# Patient Record
Sex: Male | Born: 1965 | Race: Black or African American | Hispanic: No | Marital: Married | Smoking: Never smoker
Health system: Southern US, Community
[De-identification: ages and names within clinical notes are randomized; demographics above are authoritative.]

## PROBLEM LIST (undated history)

## (undated) DIAGNOSIS — K219 Gastro-esophageal reflux disease without esophagitis: Secondary | ICD-10-CM

## (undated) DIAGNOSIS — I493 Ventricular premature depolarization: Secondary | ICD-10-CM

## (undated) DIAGNOSIS — K921 Melena: Secondary | ICD-10-CM

## (undated) HISTORY — DX: Melena: K92.1

## (undated) HISTORY — DX: Gastro-esophageal reflux disease without esophagitis: K21.9

## (undated) HISTORY — DX: Ventricular premature depolarization: I49.3

## (undated) HISTORY — PX: OTHER SURGICAL HISTORY: SHX169

---

## 1998-08-30 ENCOUNTER — Emergency Department (HOSPITAL_COMMUNITY): Admission: EM | Admit: 1998-08-30 | Discharge: 1998-08-30 | Payer: Self-pay | Admitting: Emergency Medicine

## 1998-08-30 ENCOUNTER — Encounter: Payer: Self-pay | Admitting: Internal Medicine

## 2000-06-25 ENCOUNTER — Encounter: Payer: Self-pay | Admitting: Emergency Medicine

## 2000-06-25 ENCOUNTER — Emergency Department (HOSPITAL_COMMUNITY): Admission: EM | Admit: 2000-06-25 | Discharge: 2000-06-25 | Payer: Self-pay | Admitting: Emergency Medicine

## 2002-03-10 ENCOUNTER — Emergency Department (HOSPITAL_COMMUNITY): Admission: EM | Admit: 2002-03-10 | Discharge: 2002-03-10 | Payer: Self-pay | Admitting: *Deleted

## 2002-03-10 ENCOUNTER — Encounter: Payer: Self-pay | Admitting: *Deleted

## 2003-06-17 ENCOUNTER — Emergency Department (HOSPITAL_COMMUNITY): Admission: EM | Admit: 2003-06-17 | Discharge: 2003-06-17 | Payer: Self-pay

## 2004-12-04 ENCOUNTER — Ambulatory Visit: Payer: Self-pay

## 2008-01-19 ENCOUNTER — Encounter: Payer: Self-pay | Admitting: Gastroenterology

## 2008-01-20 ENCOUNTER — Ambulatory Visit: Payer: Self-pay | Admitting: Cardiology

## 2008-01-30 ENCOUNTER — Encounter: Payer: Self-pay | Admitting: Gastroenterology

## 2008-01-31 ENCOUNTER — Ambulatory Visit: Payer: Self-pay

## 2008-01-31 ENCOUNTER — Encounter: Payer: Self-pay | Admitting: Cardiology

## 2008-02-16 ENCOUNTER — Ambulatory Visit: Payer: Self-pay | Admitting: Gastroenterology

## 2008-02-16 DIAGNOSIS — K219 Gastro-esophageal reflux disease without esophagitis: Secondary | ICD-10-CM | POA: Insufficient documentation

## 2008-02-16 DIAGNOSIS — R1012 Left upper quadrant pain: Secondary | ICD-10-CM | POA: Insufficient documentation

## 2008-02-16 DIAGNOSIS — R195 Other fecal abnormalities: Secondary | ICD-10-CM | POA: Insufficient documentation

## 2008-03-15 ENCOUNTER — Ambulatory Visit: Payer: Self-pay | Admitting: Gastroenterology

## 2008-04-13 ENCOUNTER — Ambulatory Visit: Payer: Self-pay | Admitting: Gastroenterology

## 2008-06-07 ENCOUNTER — Ambulatory Visit: Payer: Self-pay | Admitting: Gastroenterology

## 2008-07-04 ENCOUNTER — Encounter: Payer: Self-pay | Admitting: Gastroenterology

## 2009-02-25 ENCOUNTER — Emergency Department (HOSPITAL_COMMUNITY): Admission: EM | Admit: 2009-02-25 | Discharge: 2009-02-25 | Payer: Self-pay | Admitting: Emergency Medicine

## 2010-08-19 NOTE — Assessment & Plan Note (Signed)
[facility]                            CARDIOLOGY OFFICE NOTE   Juan Tanner, Juan Tanner                       MRN:          [id number]  DATE:01/20/2008                            DOB:          11/07/1965    REFERRING PHYSICIAN:  [facility]   REASON FOR CONSULTATION:  Evaluate patient with abnormal  electrocardiogram, chest pain and premature ventricular contractions.   HISTORY OF PRESENT ILLNESS:  The patient is a pleasant 45 year old  gentleman originally from [place].  He has had no prior cardiac history.  He has a history of some abdominal discomfort. He said he had this some  years ago, though I am not clear what the etiology was.  At this time,  he noticed some chest discomfort and palpitations while this was going  on.  He was treated apparently for peptic ulcer disease, though he did  not describe a workup to confirm this.  He now was presenting to Urgent  Care with somewhat similar complaints.  He is complaining of discomfort  in his left lower quadrant.  He says when this starts he also starts to  get some discomfort up into his chest.  He sometimes feels a little  short of breath with this.  He may have some occasional palpitations.  He may have some stabbing discomfort in his chest under his left breast.  This is fleeting though.  His predominant complaint has been this  abdominal discomfort, though he has not had any fevers, chills, nausea,  vomiting, diarrhea, constipation, hematemesis, hematochezia, or melena.  He did see urgent care yesterday and apparently drew blood.  I am not  sure of these results.  He has been able to work.  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  With  this, he denies any chest pressure, neck, or arm discomfort.  He has had  no palpitations with this activity.  He denies any presyncope or  syncope.   PAST MEDICAL HISTORY:  He has no history of hypertension,  diabetes, or  hyperlipidemia.   PAST SURGICAL HISTORY:  None.   ALLERGIES:  None.   MEDICATIONS:  None.   SOCIAL HISTORY:  He never smoked cigarettes.  He is not drink alcohol.  He does work.  He is married.  He has one brand new son.   FAMILY HISTORY:  Noncontributory for early coronary artery disease.   REVIEW OF SYSTEMS:  As stated in the HPI and negative for all other  systems.   PHYSICAL EXAMINATION:  GENERAL:  The patient is pleasant and in no  distress.  VITAL SIGNS:  Blood pressure 120/73, heart rate 61 and regular, weight  165 pounds, and body mass index 26.  HEENT:  Eyelids are unremarkable; pupils are equal, round, reactive to  light; fundi not visualized; fundi within normal limits; and oral mucosa  unremarkable.  NECK:  No jugular distention at 45 degrees; carotid upstroke brisk and  symmetric; no bruits; and no thyromegaly.  LYMPHATICS:  No cervical, axillary, or inguinal adenopathy.  LUNGS:  Clear to  auscultation bilaterally.  BACK:  No costovertebral tenderness.  CHEST:  Unremarkable.  HEART:  PMI not displaced or sustained; S1 and S2 within normal limits;  no S3, no S4, no clicks, no rubs, and no murmurs.  ABDOMEN:  Flat;  positive bowel sounds; normal in frequency and pitch; no bruits, no  rebound, no significant tenderness, though there is some slight  tenderness in the right lower quadrant with deep palpation.  SKIN:  No rashes, no nodules.  EXTREMITIES:  A 2+ pulse throughout; no edema, no cyanosis, and no  clubbing.  NEURO:  Oriented to person, place, and time; cranial nerves II through  XII are grossly intact; motor grossly intact.   EKG sinus rhythm, rate 61; axis within normal limits, intervals within  normal limits, premature ventricular contraction, voltage does not meet  criteria for LVH in this young gentleman.   ASSESSMENT AND PLAN:  1. Chest comfort and premature ventricular contractions.  The patient      has these findings.  Chest  discomfort is atypical.  At this point,      I plan an echocardiogram to make he has a structurally normal      heart.  We will check the labs and that were done at Urgent Care,      hoping to find a TSH, BMET, and magnesium.  If not these can be      drawn when he comes back for his echo.  If he has a structurally      normal heart and normal labs, since he is not particularly feeling      these palpitations and the pain is atypical, I would not suggest      further cardiovascular testing.  However, I will set him up to see      gastroenterologist as his predominant complaint is his recurrent      left lower quadrant discomfort.  He has not any acute distress.      This will be an elective evaluation.  2. Follow up will be based on results of the above.  Although, I have      instructed him carefully by coming back for any increase in his      symptoms.     Juan Rotunda, MD, [facility]  Electronically Signed    JH/MedQ  DD: 01/20/2008  DT: 01/21/2008  Job #: [id number]   cc:   [facility]

## 2011-05-09 ENCOUNTER — Ambulatory Visit (INDEPENDENT_AMBULATORY_CARE_PROVIDER_SITE_OTHER): Payer: BC Managed Care – PPO | Admitting: Family Medicine

## 2011-05-09 DIAGNOSIS — R300 Dysuria: Secondary | ICD-10-CM

## 2011-05-09 DIAGNOSIS — K2970 Gastritis, unspecified, without bleeding: Secondary | ICD-10-CM

## 2011-05-09 DIAGNOSIS — K2990 Gastroduodenitis, unspecified, without bleeding: Secondary | ICD-10-CM

## 2011-05-09 DIAGNOSIS — Z113 Encounter for screening for infections with a predominantly sexual mode of transmission: Secondary | ICD-10-CM

## 2011-05-09 DIAGNOSIS — R369 Urethral discharge, unspecified: Secondary | ICD-10-CM

## 2011-05-09 LAB — POCT UA - MICROSCOPIC ONLY
Bacteria, U Microscopic: NEGATIVE
Casts, Ur, LPF, POC: NEGATIVE
Crystals, Ur, HPF, POC: NEGATIVE
Mucus, UA: NEGATIVE
RBC, urine, microscopic: NEGATIVE
Yeast, UA: NEGATIVE

## 2011-05-09 LAB — POCT CBC
Granulocyte percent: 49.1 %G (ref 37–80)
HCT, POC: 43.6 % (ref 43.5–53.7)
Hemoglobin: 13.9 g/dL — AB (ref 14.1–18.1)
Lymph, poc: 3.7 — AB (ref 0.6–3.4)
MCH, POC: 27.3 pg (ref 27–31.2)
MCHC: 31.9 g/dL (ref 31.8–35.4)
MCV: 85.7 fL (ref 80–97)
MID (cbc): 0.6 (ref 0–0.9)
MPV: 12.3 fL (ref 0–99.8)
POC Granulocyte: 4.1 (ref 2–6.9)
POC LYMPH PERCENT: 44.1 %L (ref 10–50)
POC MID %: 6.8 % (ref 0–12)
Platelet Count, POC: 229 10*3/uL (ref 142–424)
RBC: 5.09 M/uL (ref 4.69–6.13)
RDW, POC: 14.1 %
WBC: 8.4 10*3/uL (ref 4.6–10.2)

## 2011-05-09 LAB — POCT URINALYSIS DIPSTICK
Bilirubin, UA: NEGATIVE
Blood, UA: NEGATIVE
Glucose, UA: NEGATIVE
Ketones, UA: NEGATIVE
Leukocytes, UA: NEGATIVE
Nitrite, UA: NEGATIVE
Protein, UA: NEGATIVE
Spec Grav, UA: 1.015
Urobilinogen, UA: 0.2
pH, UA: 6

## 2011-05-09 MED ORDER — SUCRALFATE 1 G PO TABS: 1.0000 g | ORAL_TABLET | Freq: Four times a day (QID) | ORAL | Status: DC

## 2011-05-10 LAB — HIV ANTIBODY (ROUTINE TESTING W REFLEX): HIV: NONREACTIVE

## 2011-05-10 NOTE — Progress Notes (Signed)
  Subjective:    Patient ID: Juan Tanner, male    DOB: 19-Nov-1965, 46 y.o.   MRN: [id number]  Abdominal Pain This is a chronic problem. The current episode started more than 1 year ago. The onset quality is undetermined. The problem occurs intermittently. The problem has been unchanged. The pain is located in the epigastric region. The pain is mild. The quality of the pain is aching, burning and cramping. The abdominal pain does not radiate. Associated symptoms include dysuria. Pertinent negatives include no constipation, diarrhea, fever, frequency, headaches, hematuria, myalgias, nausea or vomiting. The pain is aggravated by nothing. The pain is relieved by nothing. He has tried proton pump inhibitors for the symptoms.  Dysuria  This is a new problem. The current episode started in the past 7 days. The problem occurs intermittently. The quality of the pain is described as burning. The patient is experiencing no pain. There has been no fever. The fever has been present for 3 - 4 days. He is sexually active. There is no history of pyelonephritis. Associated symptoms include flank pain. Pertinent negatives include no chills, discharge, frequency, hematuria, hesitancy, nausea, sweats, urgency or vomiting. He has tried nothing for the symptoms. There is no history of kidney stones or recurrent UTIs.   Has a penile rash, ? discharge   Review of Systems  Constitutional: Negative for fever, chills, diaphoresis, activity change, appetite change, fatigue and unexpected weight change.  HENT: Negative for neck pain.   Respiratory: Negative for shortness of breath and wheezing.   Cardiovascular: Negative for chest pain and palpitations.  Gastrointestinal: Positive for abdominal pain. Negative for nausea, vomiting, diarrhea, constipation, blood in stool and abdominal distention.  Genitourinary: Positive for dysuria and flank pain. Negative for hesitancy, urgency, frequency, hematuria, discharge, difficulty  urinating and testicular pain.  Musculoskeletal: Negative for myalgias and joint swelling.  Neurological: Negative for headaches.  Hematological: Negative for adenopathy.       Objective:   Physical Exam  Constitutional: He is oriented to person, place, and time. He appears well-developed and well-nourished.  HENT:  Head: Normocephalic and atraumatic.  Eyes: EOM are normal.  Neck: Normal range of motion. Neck supple. No thyromegaly present.  Cardiovascular: Normal rate, regular rhythm and normal heart sounds.  Exam reveals no gallop and no friction rub.   No murmur heard. Pulmonary/Chest: Effort normal and breath sounds normal.  Abdominal: Soft. Bowel sounds are normal. He exhibits no distension and no mass. There is no tenderness. There is no rebound and no guarding.  Genitourinary: No penile tenderness.       Penile rash, appears pearly, waxt with umbilication, ? Molluscum contagiosum.   Musculoskeletal: Normal range of motion.  Neurological: He is alert and oriented to person, place, and time. He has normal reflexes.  Skin: Skin is warm and dry. No erythema.  Psychiatric: He has a normal mood and affect.          Assessment & Plan:  1. Mid epigastric pain most likely related to gastritis with  h/o ulcers due to H Pylori.- Rx Carafate and PPO for now. Check H/ Pylori. 2. Penile rash and dysuria-UA was negative. I will get STD test although pt denies any h/o of STD. 3. 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 STD Screening-Check HIV, G/C urine probe, RPR, HSV 1 & 2.   F/u in 2 weeks

## 2011-05-11 LAB — GC/CHLAMYDIA PROBE AMP, URINE
Chlamydia, Swab/Urine, PCR: NEGATIVE
GC Probe Amp, Urine: NEGATIVE

## 2011-05-11 LAB — RPR

## 2011-05-11 LAB — HSV(HERPES SIMPLEX VRS) I + II AB-IGG
HSV 1 Glycoprotein G Ab, IgG: 10.79 IV — ABNORMAL HIGH
HSV 2 Glycoprotein G Ab, IgG: 4.77 IV — ABNORMAL HIGH

## 2011-05-18 ENCOUNTER — Telehealth: Payer: Self-pay

## 2011-05-18 ENCOUNTER — Other Ambulatory Visit: Payer: Self-pay | Admitting: Family Medicine

## 2011-05-18 NOTE — Telephone Encounter (Signed)
Pt would like lab results.  

## 2011-05-18 NOTE — Telephone Encounter (Signed)
.  [facility]  PT HAD LABS DONE AND REALLY WOULD LIKE RESULTS PLEASE CALL [phone]

## 2011-05-19 NOTE — Telephone Encounter (Signed)
Spoke with Dr. Conley Rolls and she plans to discuss labs with patient in am.  Called patient and let him know this.

## 2011-05-20 ENCOUNTER — Telehealth: Payer: Self-pay | Admitting: Family Medicine

## 2011-05-20 DIAGNOSIS — K219 Gastro-esophageal reflux disease without esophagitis: Secondary | ICD-10-CM

## 2011-05-20 DIAGNOSIS — A6000 Herpesviral infection of urogenital system, unspecified: Secondary | ICD-10-CM

## 2011-05-20 MED ORDER — OMEPRAZOLE 20 MG PO CPDR: 20.0000 mg | DELAYED_RELEASE_CAPSULE | Freq: Two times a day (BID) | ORAL | Status: DC

## 2011-05-20 MED ORDER — VALACYCLOVIR HCL 1 G PO TABS: 1000.0000 mg | ORAL_TABLET | Freq: Two times a day (BID) | ORAL | Status: AC

## 2011-05-20 NOTE — Telephone Encounter (Signed)
Spoke with patient regarding labs. 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

## 2011-10-11 ENCOUNTER — Ambulatory Visit: Payer: BC Managed Care – PPO

## 2011-10-11 ENCOUNTER — Ambulatory Visit (INDEPENDENT_AMBULATORY_CARE_PROVIDER_SITE_OTHER): Payer: BC Managed Care – PPO | Admitting: Emergency Medicine

## 2011-10-11 VITALS — BP 102/62 | HR 54 | Temp 97.7°F | Resp 14 | Ht 67.0 in | Wt 158.4 lb

## 2011-10-11 DIAGNOSIS — K219 Gastro-esophageal reflux disease without esophagitis: Secondary | ICD-10-CM

## 2011-10-11 MED ORDER — LANSOPRAZOLE 30 MG PO CPDR: 30.0000 mg | DELAYED_RELEASE_CAPSULE | Freq: Every day | ORAL | Status: DC

## 2011-10-11 MED ORDER — SUCRALFATE 1 G PO TABS: 1.0000 g | ORAL_TABLET | Freq: Four times a day (QID) | ORAL | Status: DC

## 2011-10-11 NOTE — Progress Notes (Signed)
  Subjective:    Patient ID: Juan Tanner, male    DOB: 03/29/1966, 46 y.o.   MRN: [id number]  HPI    Review of Systems     Objective:   Physical Exam        Assessment & Plan:  [facility] reading (PRIMARY) by  Dr. Dareen Piano.  No acute disease.

## 2011-10-11 NOTE — Patient Instructions (Signed)
GERDGastroesophageal Reflux Disease, Adult Gastroesophageal reflux disease (GERD) happens when acid from your stomach flows up into the esophagus. When acid comes in contact with the esophagus, the acid causes soreness (inflammation) in the esophagus. Over time, GERD may create small holes (ulcers) in the lining of the esophagus. CAUSES   Increased body weight. This puts pressure on the stomach, making acid rise from the stomach into the esophagus.   Smoking. This increases acid production in the stomach.   Drinking alcohol. This causes decreased pressure in the lower esophageal sphincter (valve or ring of muscle between the esophagus and stomach), allowing acid from the stomach into the esophagus.   Late evening meals and a full stomach. This increases pressure and acid production in the stomach.   A malformed lower esophageal sphincter.  Sometimes, no cause is found. SYMPTOMS   Burning pain in the lower part of the mid-chest behind the breastbone and in the mid-stomach area. This may occur twice a week or more often.   Trouble swallowing.   Sore throat.   Dry cough.   Asthma-like symptoms including chest tightness, shortness of breath, or wheezing.  DIAGNOSIS  Your caregiver may be able to diagnose GERD based on your symptoms. In some cases, X-rays and other tests may be done to check for complications or to check the condition of your stomach and esophagus. TREATMENT  Your caregiver may recommend over-the-counter or prescription medicines to help decrease acid production. Ask your caregiver before starting or adding any new medicines.  HOME CARE INSTRUCTIONS   Change the factors that you can control. Ask your caregiver for guidance concerning weight loss, quitting smoking, and alcohol consumption.   Avoid foods and drinks that make your symptoms worse, such as:   Caffeine or alcoholic drinks.   Chocolate.   Peppermint or mint flavorings.   Garlic and onions.   Spicy  foods.   Citrus fruits, such as oranges, lemons, or limes.   Tomato-based foods such as sauce, chili, salsa, and pizza.   Fried and fatty foods.   Avoid lying down for the 3 hours prior to your bedtime or prior to taking a nap.   Eat small, frequent meals instead of large meals.   Wear loose-fitting clothing. Do not wear anything tight around your waist that causes pressure on your stomach.   Raise the head of your bed 6 to 8 inches with wood blocks to help you sleep. Extra pillows will not help.   Only take over-the-counter or prescription medicines for pain, discomfort, or fever as directed by your caregiver.   Do not take aspirin, ibuprofen, or other nonsteroidal anti-inflammatory drugs (NSAIDs).  SEEK IMMEDIATE MEDICAL CARE IF:   You have pain in your arms, neck, jaw, teeth, or back.   Your pain increases or changes in intensity or duration.   You develop nausea, vomiting, or sweating (diaphoresis).   You develop shortness of breath, or you faint.   Your vomit is green, yellow, black, or looks like coffee grounds or blood.   Your stool is red, bloody, or black.  These symptoms could be signs of other problems, such as heart disease, gastric bleeding, or esophageal bleeding. MAKE SURE YOU:   Understand these instructions.   Will watch your condition.   Will get help right away if you are not doing well or get worse.  Document Released: 12/31/2004 Document Revised: 03/12/2011 Document Reviewed: 10/10/2010 [facility] Patient Information 2012 [place], [place].

## 2011-10-11 NOTE — Progress Notes (Signed)
  Subjective:    Patient ID: Juan Tanner, male    DOB: 11-28-1965, 46 y.o.   MRN: [id number]  Shortness of Breath This is a recurrent problem. The current episode started 1 to 4 weeks ago. The problem occurs daily. The problem has been unchanged. Associated symptoms include abdominal pain. Pertinent negatives include no claudication, coryza, hemoptysis, leg swelling, orthopnea, PND, rash, sputum production, syncope or wheezing. The symptoms are aggravated by lying flat. He has tried nothing for the symptoms. There is no history of allergies, aspirin allergies, asthma, bronchiolitis, CAD, chronic lung disease, COPD, DVT, a heart failure, PE, pneumonia or a recent surgery.      Review of Systems  Constitutional: Positive for appetite change.  HENT: Negative.   Eyes: Negative.   Respiratory: Positive for shortness of breath. Negative for hemoptysis, sputum production and wheezing.   Cardiovascular: Negative.  Negative for orthopnea, claudication, leg swelling, syncope and PND.  Gastrointestinal: Positive for nausea and abdominal pain.  Genitourinary: Negative.   Musculoskeletal: Negative.   Skin: Negative for rash.  Neurological: Negative.        Objective:   Physical Exam  Constitutional: He is oriented to person, place, and time. He appears well-developed and well-nourished.  HENT:  Head: Normocephalic and atraumatic.  Eyes: Pupils are equal, round, and reactive to light. No scleral icterus.  Neck: Normal range of motion. Neck supple. No thyromegaly present.  Cardiovascular: Normal rate, regular rhythm and normal heart sounds.   Pulmonary/Chest: Effort normal and breath sounds normal.  Abdominal: Soft. There is tenderness in the epigastric area.  Musculoskeletal: Normal range of motion.  Neurological: He is alert and oriented to person, place, and time.  Skin: Skin is warm and dry.          Assessment & Plan:  History of GERD off medication prolonged period.  Describes  chest burning pain like suomething rising in chest.  Frequent indigestion.   No cough or objective shortness of breath  Will get CXR and EKG

## 2012-05-09 ENCOUNTER — Ambulatory Visit: Payer: BC Managed Care – PPO

## 2012-05-09 ENCOUNTER — Ambulatory Visit (INDEPENDENT_AMBULATORY_CARE_PROVIDER_SITE_OTHER): Payer: BC Managed Care – PPO | Admitting: Family Medicine

## 2012-05-09 VITALS — BP 120/70 | HR 72 | Temp 98.0°F | Resp 16 | Ht 66.5 in | Wt 161.0 lb

## 2012-05-09 DIAGNOSIS — A6000 Herpesviral infection of urogenital system, unspecified: Secondary | ICD-10-CM

## 2012-05-09 DIAGNOSIS — R109 Unspecified abdominal pain: Secondary | ICD-10-CM

## 2012-05-09 DIAGNOSIS — K219 Gastro-esophageal reflux disease without esophagitis: Secondary | ICD-10-CM

## 2012-05-09 DIAGNOSIS — I498 Other specified cardiac arrhythmias: Secondary | ICD-10-CM

## 2012-05-09 DIAGNOSIS — R001 Bradycardia, unspecified: Secondary | ICD-10-CM

## 2012-05-09 LAB — COMPREHENSIVE METABOLIC PANEL
ALT: 14 U/L (ref 0–53)
Albumin: 4.1 g/dL (ref 3.5–5.2)
BUN: 11 mg/dL (ref 6–23)
CO2: 27 mEq/L (ref 19–32)
Calcium: 9 mg/dL (ref 8.4–10.5)
Chloride: 107 mEq/L (ref 96–112)
Creat: 0.75 mg/dL (ref 0.50–1.35)
Potassium: 4 mEq/L (ref 3.5–5.3)

## 2012-05-09 LAB — POCT CBC
Granulocyte percent: 51.1 %G (ref 37–80)
Hemoglobin: 13.6 g/dL — AB (ref 14.1–18.1)
MCH, POC: 27 pg (ref 27–31.2)
MCV: 87.8 fL (ref 80–97)
RBC: 5.03 M/uL (ref 4.69–6.13)
WBC: 6 10*3/uL (ref 4.6–10.2)

## 2012-05-09 MED ORDER — GI COCKTAIL ~~LOC~~
30.0000 mL | Freq: Once | ORAL | Status: AC
  Administered 2012-05-09: 30 mL via ORAL

## 2012-05-09 MED ORDER — SUCRALFATE 1 G PO TABS: 1.0000 g | ORAL_TABLET | Freq: Four times a day (QID) | ORAL | Status: DC

## 2012-05-09 MED ORDER — OMEPRAZOLE 20 MG PO CPDR: 20.0000 mg | DELAYED_RELEASE_CAPSULE | Freq: Two times a day (BID) | ORAL | Status: DC

## 2012-05-09 NOTE — Patient Instructions (Addendum)
We are going to treat your for stomach pain and excess stomach acid.  However, as we discussed it is also possible that you have an infection in your colon.  If you are not better by tomorrow please come back in- in that case will will need to do the CT scan.  I am going to send you back to the heart doctor as well- we will be in touch with you regarding your appointment    If you get worse tonight (incrased pain, vomiting, or fever) go to the Emergency Room at the hospital

## 2012-05-09 NOTE — Progress Notes (Signed)
[facility] [address], [place] [address] [phone]  Date:  05/09/2012   Name:  Juan Tanner   DOB:  14-Dec-1965   MRN:  [id number]  PCP:  No primary provider on file.    Chief Complaint: Abdominal Pain   History of Present Illness:  Juan Tanner is a 47 y.o. very pleasant male patient who presents with the following:  He is here today with a recurrent stomach problem.  He notes pain in the epigastric area, bloating and gas.  He has noted frequent loose stools for the last couple of days.  He is burping a lot and notes burning in his chest.  No nausea or vomiting.   He had eaten rice and beans prior to start of illness- he does feel that "it's something I ate."  He is still eating, but smaller amounts than usual. 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  Review of his paper chart backs ups this history.   Cardiac history: he denies any personal or family history of CAD.  He does not smoke. He has never noted CP with exercise.  He does not note any particular heart palpitations.  However, when his stomach acts up he does tend to notice a burning feeling into his chest- he associates this with GERD and not with his heart.  He has noted discomfort (like a burning) in his chest when his GERD has been symptomatic in the past as well  Patient Active Problem List  Diagnosis  . GERD  . ABDOMINAL PAIN, LEFT UPPER QUADRANT  . BLOOD IN STOOL, OCCULT    History reviewed. No pertinent past medical history.  History reviewed. No pertinent past surgical history.  History  Substance Use Topics  . Smoking status: Never Smoker   . Smokeless tobacco: Not on file  . Alcohol Use: No    No family history on file.  No Known Allergies  Medication list has been reviewed and updated.  Current Outpatient Prescriptions on File Prior to Visit   Medication Sig Dispense Refill  . lansoprazole (PREVACID) 30 MG capsule Take 1 capsule (30 mg total) by mouth daily.  30 capsule  5  . omeprazole (PRILOSEC) 20 MG capsule Take 1 capsule (20 mg total) by mouth 2 (two) times daily.  60 capsule  3  . sucralfate (CARAFATE) 1 G tablet Take 1 tablet (1 g total) by mouth 4 (four) times daily.  120 tablet  0  . sucralfate (CARAFATE) 1 G tablet Take 1 tablet (1 g total) by mouth 4 (four) times daily. 1 HOUR AC AND HS  120 tablet  0    Review of Systems:  As per HPI- otherwise negative.   Physical Examination: Filed Vitals:   05/09/12 0818  BP: 120/70  Pulse: 72  Temp: 98 F (36.7 C)  Resp: 16   Filed Vitals:   05/09/12 0818  Height: 5' 6.5" (1.689 m)  Weight: 161 lb (73.029 kg)   Body mass index is 25.60 kg/(m^2). Ideal Body Weight: Weight in (lb) to have BMI = 25: 156.9   GEN: WDWN, NAD, Non-toxic, A & O x 3, well appearing African male HEENT: Atraumatic, Normocephalic. Neck supple. No masses, No LAD. Bilateral TM wnl, oropharynx normal.  PEERL,EOMI.   Ears and Nose: No external deformity. CV: bradycardic and irregular, No M/G/R.  No JVD. No thrill. No extra heart sounds. PULM: CTA B, no wheezes, crackles, rhonchi. No retractions. No resp. distress. No accessory muscle use. ABD: S, ND, +BS. No rebound. No HSM.  He has minimal LLQ discomfort.  On re-exam this discomfort is in the epigastric and LLQ areas.  No RLQ tenderness  EXTR: No c/c/e NEURO Normal gait.  PSYCH: Normally interactive. Conversant. Not depressed or anxious appearing.  Calm demeanor.   EKG: ventricular bigeminy.    [facility] reading (PRIMARY) by  Dr. Patsy Lager. Abdominal series: no free air or obstruction apparent.  CXR: stable from 7/13, no active disease  CHEST - 2 VIEW  Comparison: 10/11/2011  Findings: Grossly unchanged cardiac silhouette and mediastinal contours. No focal airspace opacity. No pleural effusion or pneumothorax. Unchanged  bones.  IMPRESSION: No acute cardiopulmonary disease.  Clinical Data: Left lower quadrant pain  ABDOMEN - 2 VIEW  Comparison: None.  Findings: Normal bowel gas pattern. No obstruction or free air. The soft tissues and bony structures are unremarkable.  IMPRESSION: Normal exam  GI cocktail: did seem to help with his symptoms  Results for orders placed in visit on 05/09/12  POCT CBC      Component Value Range   WBC 6.0  4.6 - 10.2 K/uL   Lymph, poc 2.3  0.6 - 3.4   POC LYMPH PERCENT 39.1  10 - 50 %L   MID (cbc) 0.6  0 - 0.9   POC MID % 9.8  0 - 12 %M   POC Granulocyte 3.1  2 - 6.9   Granulocyte percent 51.1  37 - 80 %G   RBC 5.03  4.69 - 6.13 M/uL   Hemoglobin 13.6 (*) 14.1 - 18.1 g/dL   HCT, POC 52.8  41.3 - 53.7 %   MCV 87.8  80 - 97 fL   MCH, POC 27.0  27 - 31.2 pg   MCHC 30.8 (*) 31.8 - 35.4 g/dL   RDW, POC 24.4     Platelet Count, POC 218  142 - 424 K/uL   MPV 12.1  0 - 99.8 fL   Discussed his case with Dr. Johney Frame at [facility] cardiology who kindly reviewed his EKG.  He was seen by their office in 2009 and had an echo.  He has been noted to have frequent PVCs in the past but I do not see any history of bigeminy. No history of stress testing.  Dr. Johney Frame did recommend that we have Sophie follow-up at their office soon, but no acute or dangerous EKG changes noted at this time. Anuj is not having any CP suggestive of cardiac ischemia  Assessment and Plan: 1. Abdominal  pain, other specified site  POCT CBC, Comprehensive metabolic panel, DG Abd 2 Views  2. Bradycardia  EKG 12-Lead, Ambulatory referral to Cardiology  3. GERD (gastroesophageal reflux disease)  gi cocktail (Maalox,Lidocaine,Donnatal), DG Chest 2 View, omeprazole (PRILOSEC) 20 MG capsule, sucralfate (CARAFATE) 1 G tablet  4. Herpes genitalia  omeprazole (PRILOSEC) 20 MG capsule  5. Bigeminy  Ambulatory referral to Cardiology   [name] is here today with what he describes as his typical GERD symptoms.  I  discussed with him his EKG, and he received a call from [place] while still in the office asking him to call back to schedule an appt. He plans to follow- up with their office.    Long discussion regarding his LLQ pain- there is some language barrier but I was very careful to be sure Leonidus understood our discussion  and his options.  His CBC is reassuring, as is his history of having this pain in the past with GERD. However, it is also possible that he is having diverticulitis.  Offered to perform a CT today, and discussed the risk of unnecessary radiation if negative vs the benefit of early detection and avoidance of complications if positive.  For the time being [facility] declined a scan- we will treat him with carafate and prilosec as he has done in the past.  Await his CMP and will follow- up with him.  He knows to come back tomorrow if not better- to ED if worse in the meantime.    Abbe Amsterdam, MD

## 2012-05-10 ENCOUNTER — Ambulatory Visit (HOSPITAL_COMMUNITY): Payer: BC Managed Care – PPO | Attending: Cardiology | Admitting: Radiology

## 2012-05-10 ENCOUNTER — Other Ambulatory Visit (HOSPITAL_COMMUNITY): Payer: Self-pay

## 2012-05-10 ENCOUNTER — Other Ambulatory Visit (HOSPITAL_COMMUNITY): Payer: Self-pay | Admitting: Family Medicine

## 2012-05-10 DIAGNOSIS — R001 Bradycardia, unspecified: Secondary | ICD-10-CM

## 2012-05-10 DIAGNOSIS — I079 Rheumatic tricuspid valve disease, unspecified: Secondary | ICD-10-CM | POA: Insufficient documentation

## 2012-05-10 DIAGNOSIS — I495 Sick sinus syndrome: Secondary | ICD-10-CM | POA: Insufficient documentation

## 2012-05-10 DIAGNOSIS — I379 Nonrheumatic pulmonary valve disorder, unspecified: Secondary | ICD-10-CM | POA: Insufficient documentation

## 2012-05-10 NOTE — Progress Notes (Signed)
Called today and LMOM- his CMP looks ok.  Reminded him that if he still has pain we need to re- evaluate/ do a CT scan so please come to clinic in that case

## 2012-05-10 NOTE — Progress Notes (Signed)
Echocardiogram performed.  

## 2012-06-01 ENCOUNTER — Ambulatory Visit (INDEPENDENT_AMBULATORY_CARE_PROVIDER_SITE_OTHER): Payer: BC Managed Care – PPO | Admitting: Internal Medicine

## 2012-06-01 ENCOUNTER — Encounter: Payer: Self-pay | Admitting: Internal Medicine

## 2012-06-01 VITALS — BP 100/72 | HR 76 | Ht 65.0 in | Wt 164.1 lb

## 2012-06-01 DIAGNOSIS — I493 Ventricular premature depolarization: Secondary | ICD-10-CM

## 2012-06-01 DIAGNOSIS — I4949 Other premature depolarization: Secondary | ICD-10-CM

## 2012-06-01 NOTE — Patient Instructions (Addendum)
  Your physician has requested that you have an exercise tolerance test. For further information please visit [email or web address] Please also follow instruction sheet, as given.---Dr Allred  Your physician recommends that you schedule a follow-up appointment in: 4 weeks with Dr Johney Frame   Your physician has recommended that you wear a holter monitor. Holter monitors are medical devices that record the heart's electrical activity. Doctors most often use these monitors to diagnose arrhythmias. Arrhythmias are problems with the speed or rhythm of the heartbeat. The monitor is a small, portable device. You can wear one while you do your normal daily activities. This is usually used to diagnose what is causing palpitations/syncope (passing out).

## 2012-06-01 NOTE — Progress Notes (Signed)
   Primary Care Physician: Dr Juan Tanner Juan Tanner is a 48 y.o. male with a h/o recently discovered PVCs who presents today for EP consultation.  He recently presented to Dr Jenness Corner office with complaints of epigastric bloating/ gas.  He was observed to have an irregular pulse and an ekg was obtained.  This documented sinus rhythm with PVCs in bigeminy.  He denies palpitations.  He reports that his energy/ exercise tolerance is preserved.  He has occasional "indigestion" but denies exertional chest pain.  Today, he denies symptoms of shortness of breath, orthopnea, PND, lower extremity edema, dizziness, presyncope, syncope, or neurologic sequela. The patient is tolerating medications without difficulties and is otherwise without complaint today.   Past Medical History  Diagnosis Date  . Premature ventricular contraction   . GERD (gastroesophageal reflux disease)    Past Surgical History  Procedure Laterality Date  . None      Current Outpatient Prescriptions  Medication Sig Dispense Refill  . lansoprazole (PREVACID) 30 MG capsule Take 1 capsule (30 mg total) by mouth daily.  30 capsule  5  . omeprazole (PRILOSEC) 20 MG capsule Take 1 capsule (20 mg total) by mouth 2 (two) times daily. After one month change to once a day  60 capsule  3  . sucralfate (CARAFATE) 1 G tablet Take 1 tablet (1 g total) by mouth 4 (four) times daily.  120 tablet  0  . sucralfate (CARAFATE) 1 G tablet Take 1 tablet (1 g total) by mouth 4 (four) times daily. Take before meals and before going to bed  40 tablet  1   No current facility-administered medications for this visit.    No Known Allergies  History   Social History  . Marital Status: Married    Spouse Name: N/A    Number of Children: N/A  . Years of Education: N/A   Occupational History  . Not on file.   Social History Main Topics  . Smoking status: Never Smoker   . Smokeless tobacco: Not on file  . Alcohol Use: No  . Drug Use: No    . Sexually Active: Not on file   Other Topics Concern  . Not on file   Social History Narrative   Lives in [place] with spouse and children.   Works in [employer]    FH- denies FH of sudden death  ROS- All systems are reviewed and negative except as per the HPI above  Physical Exam: Filed Vitals:   06/01/12 1600  BP: 100/72  Pulse: 76  Height: 5\' 5"  (1.651 m)  Weight: 164 lb 1.9 oz (74.444 kg)    GEN- The patient is well appearing, alert and oriented x 3 today.   Head- normocephalic, atraumatic Eyes-  Sclera clear, conjunctiva pink Ears- hearing intact Oropharynx- clear Neck- supple, no JVP Lymph- no cervical lymphadenopathy Lungs- Clear to ausculation bilaterally, normal work of breathing Heart- Regular rate and rhythm with frequent ectopy, no murmurs, rubs or gallops, PMI not laterally displaced GI- soft, NT, ND, + BS Extremities- no clubbing, cyanosis, or edema MS- no significant deformity or atrophy Skin- no rash or lesion Psych- euthymic mood, full affect Neuro- strength and sensation are intact  EKG today reveals sinus rhythm with PVCs in trigeminy. PVCs are not closely coupled to the preceeding t wave.  PVC morphology is LBB inferior axis with abrupt transition at V3. Echo 05/10/12 is reviewed and reveals no significant structural heart disease  Assessment and Plan:

## 2012-06-06 DIAGNOSIS — I493 Ventricular premature depolarization: Secondary | ICD-10-CM | POA: Insufficient documentation

## 2012-06-06 NOTE — Assessment & Plan Note (Signed)
The patient has frequent premature ventricular contractions.  He has no evidence of structural heart disease.  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  At this time, I will place a 24 hour holter to better characterize the frequency of the PVCs.  In addition, I will order a GXT to evaluate his exercise tolerance.  Return in 6 weeks for further assessment.

## 2012-06-13 ENCOUNTER — Encounter: Payer: BC Managed Care – PPO | Admitting: Physician Assistant

## 2012-06-23 ENCOUNTER — Encounter: Payer: BC Managed Care – PPO | Admitting: Physician Assistant

## 2012-06-24 ENCOUNTER — Ambulatory Visit (INDEPENDENT_AMBULATORY_CARE_PROVIDER_SITE_OTHER): Payer: BC Managed Care – PPO | Admitting: Physician Assistant

## 2012-06-24 ENCOUNTER — Encounter: Payer: Self-pay | Admitting: Internal Medicine

## 2012-06-24 ENCOUNTER — Ambulatory Visit (INDEPENDENT_AMBULATORY_CARE_PROVIDER_SITE_OTHER): Payer: BC Managed Care – PPO

## 2012-06-24 ENCOUNTER — Encounter: Payer: Self-pay | Admitting: Physician Assistant

## 2012-06-24 ENCOUNTER — Other Ambulatory Visit: Payer: Self-pay | Admitting: *Deleted

## 2012-06-24 VITALS — BP 126/62 | HR 60 | Wt 163.0 lb

## 2012-06-24 DIAGNOSIS — R079 Chest pain, unspecified: Secondary | ICD-10-CM

## 2012-06-24 DIAGNOSIS — I4949 Other premature depolarization: Secondary | ICD-10-CM

## 2012-06-24 DIAGNOSIS — R002 Palpitations: Secondary | ICD-10-CM

## 2012-06-24 DIAGNOSIS — I493 Ventricular premature depolarization: Secondary | ICD-10-CM

## 2012-06-24 DIAGNOSIS — K219 Gastro-esophageal reflux disease without esophagitis: Secondary | ICD-10-CM

## 2012-06-24 NOTE — Progress Notes (Signed)
[address], [address] [place], [place]  [address] Phone: [phone] Fax:  [phone]  Date:  06/24/2012   ID:  Juan Tanner, DOB 1965-12-14, MRN [id number]  PCP:  Janace Hoard, MD  Primary Cardiologist:  Dr. Hillis Range     History of Present Illness: Juan Tanner is a 47 y.o. male who is added on to my schedule for evaluation of chest pain.  He was to have an ETT yesterday but did not show.  He thought it was today and noted CP when he checked in, so the triage RN brought him back.  He was seen recently by Dr. Hillis Range for PVCs.  Echo 05/10/12: mild focal basal septal hypertrophy, EF 50-55%, normal wall motion, mild RVE.  He has had significant problems with abdominal discomfort, bloating and indigestion. He saw Dr. Russella Dar in 2010 with a normal EGD. He was placed on PPI for GERD at that time. He was seen by his PCP recently and placed on PPI plus Carafate. He notes that his abdominal pain and bloating improved with Carafate but worsened when this regimen was completed. He notes significant problems with certain meals, especially greasy foods. He feels somewhat short of breath. He feels discomfort radiating up into his chest. He also notes some throat discomfort as well as some radiation to his back into his arms. The pain comes and goes. It is not necessarily exertional. He does feel nauseated. He denies syncope, orthopnea, PND.  After significant questioning, it appears that all of his symptoms are related to meals.  Labs (2/14):  K 4, creatinine 0.75, ALT 14, Hgb 13.6  Wt Readings from Last 3 Encounters:  06/24/12 163 lb (73.936 kg)  06/01/12 164 lb 1.9 oz (74.444 kg)  05/09/12 161 lb (73.029 kg)     Past Medical History  Diagnosis Date  . Premature ventricular contraction   . GERD (gastroesophageal reflux disease)     Current Outpatient Prescriptions  Medication Sig Dispense Refill  . lansoprazole (PREVACID) 30 MG capsule Take 1 capsule (30 mg total) by mouth  daily.  30 capsule  5  . omeprazole (PRILOSEC) 20 MG capsule Take 1 capsule (20 mg total) by mouth 2 (two) times daily. After one month change to once a day  60 capsule  3  . sucralfate (CARAFATE) 1 G tablet Take 1 tablet (1 g total) by mouth 4 (four) times daily. Take before meals and before going to bed  40 tablet  1  . sucralfate (CARAFATE) 1 G tablet Take 1 tablet (1 g total) by mouth 4 (four) times daily.  120 tablet  0   No current facility-administered medications for this visit.    Allergies:   No Known Allergies  Social History:  The patient  reports that he has never smoked. He does not have any smokeless tobacco history on file. He reports that he does not drink alcohol or use illicit drugs.   Family History:  The patient's family history is negative for Heart attack.   ROS:  Please see the history of present illness.   He feels as though he may be developing a fever. He denies cough, vomiting, diarrhea, melena, hematochezia, hematemesis.   All other systems reviewed and negative.   PHYSICAL EXAM: VS:  BP 126/62  Pulse 60  Wt 163 lb (73.936 kg)  BMI 27.12 kg/m2 Well nourished, well developed, in no acute distress HEENT: normal Neck: no JVD Vascular: No carotid bruits Cardiac:  normal S1, S2; RRR; no  murmur Lungs:  clear to auscultation bilaterally, no wheezing, rhonchi or rales Abd: soft, nontender, no hepatomegaly Ext: no edema Skin: warm and dry Neuro:  CNs 2-12 intact, no focal abnormalities noted  EKG:  NSR, HR 60, frequent PVCs, no ischemic changes     ASSESSMENT AND PLAN:  1. Chest Pain:  Symptoms are quite atypical. They are most certainly related to GERD and his other gastrointestinal issues. His symptoms are all brought on by eating certain types of foods. His symptoms have improved in the past with taking PPIs and Carafate. I reviewed his case today with Dr. Antoine Poche. We have decided to proceed with his exercise treadmill test today. If this is unremarkable,  he should follow up with his PCP for further evaluation. He is followed at urgent care at [facility]. I have advised him to go there today for follow up. 2. GERD:  Continue PPI and follow up with PCP. 3. PVC's:  ETT today and follow up with Dr. Hillis Range 4. Disposition:  Follow up with Dr. Hillis Range as planned.  Signed, Tereso Newcomer, PA-C  1:37 PM 06/24/2012

## 2012-06-24 NOTE — Progress Notes (Signed)
Exercise Treadmill Test  Pre-Exercise Testing Evaluation Rhythm: normal sinus  Rate: 65     Test  Exercise Tolerance Test Ordering MD: Hillis Range, MD  Interpreting MD: Tereso Newcomer PA-C  Unique Test No: 1  Treadmill:  1  Indication for ETT: chest pain - rule out ischemia  Contraindication to ETT: No   Stress Modality: exercise - treadmill  Cardiac Imaging Performed: non   Protocol: standard Bruce - maximal  Max BP:  195/95  Max MPHR (bpm):  174 85% MPR (bpm):  148  MPHR obtained (bpm):  179 % MPHR obtained:  102%  Reached 85% MPHR (min:sec):  6:10 Total Exercise Time (min-sec):  9:59  Workload in METS:  11.4 Borg Scale: 17  Reason ETT Terminated:  desired heart rate attained    ST Segment Analysis At Rest: non-specific ST segment slurring With Exercise: non-specific ST changes  Other Information Arrhythmia:  frequent PVCs, Bigeminy during exercise that resolved after HR > 150 Angina during ETT:  absent (0) Quality of ETT:  diagnostic  ETT Interpretation:  normal - no evidence of ischemia by ST analysis  Comments: Good exercise tolerance. No chest pain. Normal BP response to exercise. No significant ST-T changes to suggest ischemia.  Frequent PVCs.  There was an increase in PVCs during early exercise with bigeminy at times. PVCs much less frequent (rare) after HR > 150 bpm.  Recommendations: Follow up with Dr. Hillis Range as planned. Signed, Tereso Newcomer, PA-C  12:19 PM 06/24/2012

## 2012-06-24 NOTE — Progress Notes (Signed)
Placed a 24 hr holter monitor on patient and went over instructions on how to use it and when to return it 

## 2012-06-25 ENCOUNTER — Ambulatory Visit (INDEPENDENT_AMBULATORY_CARE_PROVIDER_SITE_OTHER): Payer: BC Managed Care – PPO | Admitting: Family Medicine

## 2012-06-25 VITALS — BP 132/88 | HR 57 | Temp 97.7°F | Resp 16 | Ht 66.0 in | Wt 163.0 lb

## 2012-06-25 DIAGNOSIS — R1032 Left lower quadrant pain: Secondary | ICD-10-CM

## 2012-06-25 DIAGNOSIS — D649 Anemia, unspecified: Secondary | ICD-10-CM

## 2012-06-25 DIAGNOSIS — K219 Gastro-esophageal reflux disease without esophagitis: Secondary | ICD-10-CM

## 2012-06-25 DIAGNOSIS — M546 Pain in thoracic spine: Secondary | ICD-10-CM

## 2012-06-25 LAB — POCT CBC
HCT, POC: 41.4 % — AB (ref 43.5–53.7)
Lymph, poc: 3.2 (ref 0.6–3.4)
MCHC: 31.4 g/dL — AB (ref 31.8–35.4)
MCV: 86.3 fL (ref 80–97)
POC LYMPH PERCENT: 50 %L (ref 10–50)
RDW, POC: 13.6 %

## 2012-06-25 MED ORDER — SUCRALFATE 1 G PO TABS: 1.0000 g | ORAL_TABLET | Freq: Four times a day (QID) | ORAL | Status: DC

## 2012-06-25 NOTE — Progress Notes (Signed)
Subjective:    Patient ID: Juan Tanner, male    DOB: 04-18-1965, 47 y.o.   MRN: [id number]  HPI Juan Tanner is a 47 y.o. male  Hx of GERD and LLQ abdominal pain - reviewed 05/09/12 office visit with Dr. Patsy Lager.   Today states still having abdominal pain - similar to when seen by Dr. Patsy Lager. Had improved with Carafate - but pain returned off carafate - lower abdomen and heartburn sx's past week.  No blood in stool. Feels like food particles are still in feces. Last GI eval "long time ago". Apparently had normal EGD in 2010 - GI- Dr. Russella Dar.   Placed on PPi at that time. No measured fever, but feels like "in body". No nausea/vomiting.  Eating and drinking ok. Last BM yesterday - normal except particles of food.  Taking prevacid qd. Worse after eating fried chicken.   Now pain radiates into back - by the R shoulder blade. Past 2 weeks. Drives forklift, some cleaning at times. Denies recent injury in back.    S/p eval by cardiology yesterday, and thought that chest symptoms due to GERD.  Also had stress testing yesterday.   Results for orders placed in visit on 05/09/12  COMPREHENSIVE METABOLIC PANEL      Result Value Range   Sodium 140  135 - 145 mEq/L   Potassium 4.0  3.5 - 5.3 mEq/L   Chloride 107  96 - 112 mEq/L   CO2 27  19 - 32 mEq/L   Glucose, Bld 64 (*) 70 - 99 mg/dL   BUN 11  6 - 23 mg/dL   Creat 4.09  8.11 - 9.14 mg/dL   Total Bilirubin 0.7  0.3 - 1.2 mg/dL   Alkaline Phosphatase 100  39 - 117 U/L   AST 18  0 - 37 U/L   ALT 14  0 - 53 U/L   Total Protein 7.3  6.0 - 8.3 g/dL   Albumin 4.1  3.5 - 5.2 g/dL   Calcium 9.0  8.4 - 78.2 mg/dL  POCT CBC      Result Value Range   WBC 6.0  4.6 - 10.2 K/uL   Lymph, poc 2.3  0.6 - 3.4   POC LYMPH PERCENT 39.1  10 - 50 %L   MID (cbc) 0.6  0 - 0.9   POC MID % 9.8  0 - 12 %M   POC Granulocyte 3.1  2 - 6.9   Granulocyte percent 51.1  37 - 80 %G   RBC 5.03  4.69 - 6.13 M/uL   Hemoglobin 13.6 (*) 14.1 - 18.1 g/dL   HCT, POC 95.6   21.3 - 53.7 %   MCV 87.8  80 - 97 fL   MCH, POC 27.0  27 - 31.2 pg   MCHC 30.8 (*) 31.8 - 35.4 g/dL   RDW, POC 08.6     Platelet Count, POC 218  142 - 424 K/uL   MPV 12.1  0 - 99.8 fL     Review of Systems  Constitutional: Positive for fever (subjective only as above. ).  Cardiovascular: Negative for chest pain.       Heartburn only.   Gastrointestinal: Positive for abdominal pain. Negative for nausea, vomiting, diarrhea, constipation and blood in stool.  Musculoskeletal: Positive for myalgias (upper R back as above. ).  Skin: Negative for rash.       Objective:   Physical Exam  Vitals reviewed. Constitutional: He is oriented to person, place, and time.  He appears well-developed and well-nourished. No distress.  Cardiovascular: Normal rate, normal heart sounds and normal pulses.  An irregular rhythm present.  Occasional extrasystoles are present.  ectopic beats at times, but rate overall normal.   Pulmonary/Chest: Effort normal and breath sounds normal. No respiratory distress.  Abdominal: Soft. Bowel sounds are normal. He exhibits no distension. There is tenderness in the left lower quadrant. There is no rigidity, no guarding, no CVA tenderness, no tenderness at McBurney's point and negative Murphy's sign.  Musculoskeletal:       Back:  Neurological: He is alert and oriented to person, place, and time.  Skin: Skin is warm and dry. No rash noted.  Psychiatric: He has a normal mood and affect. His behavior is normal.     Results for orders placed in visit on 06/25/12  POCT CBC      Result Value Range   WBC 6.5  4.6 - 10.2 K/uL   Lymph, poc 3.2  0.6 - 3.4   POC LYMPH PERCENT 50.0  10 - 50 %L   MID (cbc) 0.5  0 - 0.9   POC MID % 8.3  0 - 12 %M   POC Granulocyte 2.7  2 - 6.9   Granulocyte percent 41.7  37 - 80 %G   RBC 4.80  4.69 - 6.13 M/uL   Hemoglobin 13.0 (*) 14.1 - 18.1 g/dL   HCT, POC 95.6 (*) 21.3 - 53.7 %   MCV 86.3  80 - 97 fL   MCH, POC 27.1  27 - 31.2 pg    MCHC 31.4 (*) 31.8 - 35.4 g/dL   RDW, POC 08.6     Platelet Count, POC 254  142 - 424 K/uL   MPV 10.3  0 - 99.8 fL       Assessment & Plan:  Juan Tanner is a 48 y.o. male LLQ abdominal pain - Plan: POCT CBC, Lipase, CT Abdomen Pelvis W Contrast, Ambulatory referral to Gastroenterology  GERD (gastroesophageal reflux disease) - Plan: sucralfate (CARAFATE) 1 G tablet, Ambulatory referral to Gastroenterology  Thoracic back pain  Anemia - Plan: Ambulatory referral to Gastroenterology   Upper R back pain - unlikely related to GI complaints.  Suspect rhomboid strain or spasm, but ddx of cervical source.  Trial of heat, stretches and rom of upper back and neck, and tylenol if needed.  Recheck next 2 weeks if not improving - sooner if worse.   GERD - recurrent, with persistent/recurrent abdominal pain, prior improvement on Carafate. Anemia - HGb 13.9 approx 1 year ago, 13.6 last ov, 13.0 today.  Will refer to gastroenterology - Dr. Russella Dar.  Check Lipase. Restart Carafate, and diet to avoid triggers for GERD.  With recurrent LLQ pain and anemia - will check CT abd/pelvis - r/o diverticulosis/litis, but may also need colonoscopy - refer to GI as above. Discussed plan, all questions answered and understanding of plan expressed.   At end of visit - states occasional soreness in R hand.  Plans on following up to talk about this in more detail, and to allow time for imaging inf needed.  Understanding expressed.    Patient Instructions  For your back - tylenol, stretches of back and neck, and recheck next 2 weeks if not improving - sooner if worse.  Can also recheck to discuss your hand symptoms then - or sooner if worse.   For your abdominal pain and heartburn - avoid the foods as discussed below that can make heartburn worse.  Continue  prevacid, and restart carafate.  We will refer ou back to Dr. Russella Dar, and schedule a cat scan of the abdomen in next few days. Return to the clinic or go to the nearest  emergency room if any of your symptoms worsen or new symptoms occur.    Meds ordered this encounter  Medications  . sucralfate (CARAFATE) 1 G tablet    Sig: Take 1 tablet (1 g total) by mouth 4 (four) times daily. Take before meals and before going to bed    Dispense:  40 tablet    Refill:  1

## 2012-06-25 NOTE — Patient Instructions (Addendum)
For your back - tylenol, stretches of back and neck, and recheck next 2 weeks if not improving - sooner if worse.  Can also recheck to discuss your hand symptoms then - or sooner if worse.   For your abdominal pain and heartburn - avoid the foods as discussed below that can make heartburn worse.  Continue prevacid, and restart carafate.  We will refer ou back to Dr. Russella Dar, and schedule a cat scan of the abdomen in next few days. Return to the clinic or go to the nearest emergency room if any of your symptoms worsen or new symptoms occur.

## 2012-06-27 ENCOUNTER — Encounter: Payer: Self-pay | Admitting: Gastroenterology

## 2012-07-06 ENCOUNTER — Ambulatory Visit: Payer: BC Managed Care – PPO | Admitting: Internal Medicine

## 2012-07-07 ENCOUNTER — Ambulatory Visit
Admission: RE | Admit: 2012-07-07 | Discharge: 2012-07-07 | Disposition: A | Discharge status: Home / Self Care | Payer: BC Managed Care – PPO | Source: Ambulatory Visit | Attending: Family Medicine | Admitting: Family Medicine

## 2012-07-07 DIAGNOSIS — R1032 Left lower quadrant pain: Secondary | ICD-10-CM

## 2012-07-07 MED ORDER — IOHEXOL 300 MG/ML  SOLN
100.0000 mL | Freq: Once | INTRAMUSCULAR | Status: AC | PRN
  Administered 2012-07-07: 100 mL via INTRAVENOUS

## 2012-07-13 ENCOUNTER — Encounter: Payer: Self-pay | Admitting: Gastroenterology

## 2012-07-13 ENCOUNTER — Ambulatory Visit (INDEPENDENT_AMBULATORY_CARE_PROVIDER_SITE_OTHER): Payer: BC Managed Care – PPO | Admitting: Gastroenterology

## 2012-07-13 ENCOUNTER — Encounter: Payer: BC Managed Care – PPO | Admitting: Physician Assistant

## 2012-07-13 VITALS — BP 128/80 | HR 67 | Ht 66.0 in | Wt 165.0 lb

## 2012-07-13 DIAGNOSIS — R1032 Left lower quadrant pain: Secondary | ICD-10-CM

## 2012-07-13 DIAGNOSIS — K219 Gastro-esophageal reflux disease without esophagitis: Secondary | ICD-10-CM

## 2012-07-13 MED ORDER — ESOMEPRAZOLE MAGNESIUM 40 MG PO CPDR: 40.0000 mg | DELAYED_RELEASE_CAPSULE | Freq: Every day | ORAL | Status: DC

## 2012-07-13 MED ORDER — DICYCLOMINE HCL 10 MG PO CAPS: 10.0000 mg | ORAL_CAPSULE | Freq: Three times a day (TID) | ORAL | Status: DC

## 2012-07-13 NOTE — Progress Notes (Signed)
History of Present Illness: This is a 47 -year-old male who relates intermittent problems with epigastric pain and burning substernal pain generally following meals. Taking omeprazole intermittently for the symptoms. Recently he was placed on omeprazole 20 mg twice daily and Carafate which have not helped his symptoms. He has frequent low left lower quadrant abdominal pain associated with urgent loose stools and bloating. He recently underwent an abdominal and pelvic CT which was negative. EGD in 2010 was normal, and he was treated for GERD at that time. His weight is stable appetite is good. Denies weight loss, constipation, change in stool caliber, melena, hematochezia, nausea, vomiting, dysphagia, chest pain.  Current Medications, Allergies, Past Medical History, Past Surgical History, Family History and Social History were reviewed in [employer] record.  Physical Exam: General: Well developed , well nourished, no acute distress Head: Normocephalic and atraumatic Eyes:  sclerae anicteric, EOMI Ears: Normal auditory acuity Mouth: No deformity or lesions Lungs: Clear throughout to auscultation Heart: Regular rate and rhythm; no murmurs, rubs or bruits Abdomen: Soft, mild left lower quadrant tenderness to deep palpation without rebound or guarding and non distended. No masses, hepatosplenomegaly or hernias noted. Normal Bowel sounds Musculoskeletal: Symmetrical with no gross deformities  Pulses:  Normal pulses noted Extremities: No clubbing, cyanosis, edema or deformities noted Neurological: Alert oriented x 4, grossly nonfocal Psychological:  Alert and cooperative. Normal mood and affect  Assessment and Recommendations:  1. GERD. Symptoms inadequately controlled. Discontinue omeprazole and Carafate. Intensify all antireflux measures. Begin Nexium 40 mg daily. Plan to increase to twice a day if this is not effective.  2. Probable irritable bowel syndrome. Begin  dicyclomine 10 mg a.c. and at bedtime. Return office visit for 6 weeks.

## 2012-07-13 NOTE — Patient Instructions (Addendum)
Start taking Nexium samples one tablet by mouth once daily. A prescription has been sent to your pharmacy.   We have sent the following medications to your pharmacy for you to pick up at your convenience: Bentyl.  Thank you for choosing me and [facility].  Venita Lick. Pleas Koch., MD., Clementeen Graham

## 2012-07-18 ENCOUNTER — Ambulatory Visit: Payer: BC Managed Care – PPO | Admitting: Internal Medicine

## 2012-08-19 ENCOUNTER — Encounter: Payer: Self-pay | Admitting: Internal Medicine

## 2012-08-19 ENCOUNTER — Ambulatory Visit (INDEPENDENT_AMBULATORY_CARE_PROVIDER_SITE_OTHER): Payer: BC Managed Care – PPO | Admitting: Internal Medicine

## 2012-08-19 VITALS — BP 119/61 | HR 50 | Ht 65.0 in | Wt 164.1 lb

## 2012-08-19 DIAGNOSIS — I493 Ventricular premature depolarization: Secondary | ICD-10-CM

## 2012-08-19 DIAGNOSIS — I4949 Other premature depolarization: Secondary | ICD-10-CM

## 2012-08-19 NOTE — Patient Instructions (Addendum)
Your physician wants you to follow-up in: 6 months with Dr. Allred. You will receive a reminder letter in the mail two months in advance. If you don't receive a letter, please call our office to schedule the follow-up appointment.  

## 2012-08-20 ENCOUNTER — Encounter: Payer: Self-pay | Admitting: Internal Medicine

## 2012-08-20 NOTE — Progress Notes (Signed)
PCP:  Janace Hoard, MD  The patient presents today for routine electrophysiology followup.  Since last being seen in our clinic, the patient reports doing very well.  He remains asymptomatic with his PVCs.  Today, he denies symptoms of palpitations, chest pain, shortness of breath, orthopnea, PND, lower extremity edema, dizziness, presyncope, syncope, or neurologic sequela.  The patient feels that he is tolerating medications without difficulties and is otherwise without complaint today.   Past Medical History  Diagnosis Date  . Premature ventricular contraction     outflow tract pvcs  . GERD (gastroesophageal reflux disease)    Past Surgical History  Procedure Laterality Date  . None      Current Outpatient Prescriptions  Medication Sig Dispense Refill  . dicyclomine (BENTYL) 10 MG capsule Take 1 capsule (10 mg total) by mouth 4 (four) times daily -  before meals and at bedtime.  120 capsule  5  . esomeprazole (NEXIUM) 40 MG capsule Take 1 capsule (40 mg total) by mouth daily before breakfast.  30 capsule  11   No current facility-administered medications for this visit.    No Known Allergies  History   Social History  . Marital Status: Married    Spouse Name: N/A    Number of Children: N/A  . Years of Education: N/A   Occupational History  . Not on file.   Social History Main Topics  . Smoking status: Never Smoker   . Smokeless tobacco: Never Used  . Alcohol Use: No  . Drug Use: No  . Sexually Active: Not on file   Other Topics Concern  . Not on file   Social History Narrative   Lives in [place] with spouse and children.   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 Heart attack Neg Hx     ROS-  All systems are reviewed and are negative except as outlined in the HPI above  Physical Exam: Filed Vitals:   08/19/12 1547  BP: 119/61  Pulse: 50  Height: 5\' 5"  (1.651 m)  Weight: 164 lb 1.9 oz (74.444 kg)     GEN- The patient is well appearing, alert and oriented x 3 today.   Head- normocephalic, atraumatic Eyes-  Sclera clear, conjunctiva pink Ears- hearing intact Oropharynx- clear Neck- supple, no JVP Lymph- no cervical lymphadenopathy Lungs- Clear to ausculation bilaterally, normal work of breathing Heart- Regular rate and rhythm with frequent ventricular ectopy, no murmurs, rubs or gallops, PMI not laterally displaced GI- soft, NT, ND, + BS Extremities- no clubbing, cyanosis, or edema MS- no significant deformity or atrophy Skin- no rash or lesion Psych- euthymic mood, full affect Neuro- strength and sensation are intact  ekg today reveals sinus rhythm with frequent PVCs (LBB inferior axis) not closely coupled to the preceding t wave 24 hour holter 06/24/12 reviewed which reveals sinus rhythm with 33k pvcs, no sustained ventricular arrhythmias Echo reviewed  Assessment and Plan:  1. PVCs The patient has very frequent PVCs.  EKG reveals a LBB inferior axis PVC likely arising from the outflow tract.  He has no significant structural changes on echo and EF is preserved.  PVCs are not closely coupled to the preceding t wave and there are no sustained arrhythmias on holter.  He is asymptomatic with his pvcs.  Recent GXT reveals preserved exercise tolerance. Therapeutic strategies for PVCs including conservative management or ablation were discussed in detail with the patient today.  As he  is asymptomatic, he would prefer a conservative approach.  I think that this is reasonably, though we should follow his LV function closely to make sure that he does not develop a cardiomyopathy related to his frequent ventricular ectopy.  I will therefore have him return in 6 months with a repeat echo at that time. He is instructed to contact my office immediately should he develop any symptoms of ventricular ectopy/ chf/ arrhythmias or syncope.

## 2013-02-23 ENCOUNTER — Encounter (INDEPENDENT_AMBULATORY_CARE_PROVIDER_SITE_OTHER): Payer: Self-pay

## 2013-02-23 ENCOUNTER — Encounter: Payer: Self-pay | Admitting: Internal Medicine

## 2013-02-23 ENCOUNTER — Ambulatory Visit (INDEPENDENT_AMBULATORY_CARE_PROVIDER_SITE_OTHER): Payer: BC Managed Care – PPO | Admitting: Internal Medicine

## 2013-02-23 VITALS — BP 114/68 | HR 66 | Ht 65.0 in | Wt 163.4 lb

## 2013-02-23 DIAGNOSIS — I493 Ventricular premature depolarization: Secondary | ICD-10-CM

## 2013-02-23 DIAGNOSIS — I4949 Other premature depolarization: Secondary | ICD-10-CM

## 2013-02-23 NOTE — Patient Instructions (Signed)
Your physician wants you to follow-up in: 6 months with Dr Allred You will receive a reminder letter in the mail two months in advance. If you don't receive a letter, please call our office to schedule the follow-up appointment.   Your physician has requested that you have an echocardiogram. Echocardiography is a painless test that uses sound waves to create images of your heart. It provides your doctor with information about the size and shape of your heart and how well your heart's chambers and valves are working. This procedure takes approximately one hour. There are no restrictions for this procedure.    

## 2013-02-23 NOTE — Progress Notes (Signed)
   PCP:  Janace Hoard, MD  The patient presents today for routine electrophysiology followup.  Since last being seen in our clinic, the patient reports doing very well.  He remains asymptomatic with his PVCs.  He has had URI symptoms for 2 days. Today, he denies symptoms of palpitations, exertional chest pain, shortness of breath, orthopnea, PND, lower extremity edema, dizziness, presyncope, syncope, or neurologic sequela.  He remains very active. The patient feels that he is tolerating medications without difficulties and is otherwise without complaint today.   Past Medical History  Diagnosis Date  . Premature ventricular contraction     outflow tract pvcs  . GERD (gastroesophageal reflux disease)    Past Surgical History  Procedure Laterality Date  . None      No current outpatient prescriptions on file.   No current facility-administered medications for this visit.    No Known Allergies  History   Social History  . Marital Status: Married    Spouse Name: N/A    Number of Children: N/A  . Years of Education: N/A   Occupational History  . Not on file.   Social History Main Topics  . Smoking status: Never Smoker   . Smokeless tobacco: Never Used  . Alcohol Use: No  . Drug Use: No  . Sexual Activity: Not on file   Other Topics Concern  . Not on file   Social History Narrative   Lives in [place] with spouse and children.   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 Heart attack Neg Hx     Physical Exam: Filed Vitals:   02/23/13 0920  BP: 114/68  Pulse: 66  Height: 5\' 5"  (1.651 m)  Weight: 163 lb 6.4 oz (74.118 kg)    GEN- The patient is well appearing, alert and oriented x 3 today.   Head- normocephalic, atraumatic Eyes-  Sclera clear, conjunctiva pink Ears- hearing intact Oropharynx- clear with mild erythema along posterior pharynx, no exudate or drainage Neck- supple, no JVP. No LAD Lungs- Clear to  ausculation bilaterally, normal work of breathing Heart- Regular rate and rhythm with frequent ventricular ectopy, no murmurs, rubs or gallops, PMI not laterally displaced GI- soft, NT, ND, + BS Extremities- no clubbing, cyanosis, or edema MS- no significant deformity or atrophy Skin- no rash or lesion Psych- euthymic mood, full affect Neuro- strength and sensation are intact  ekg today reveals sinus rhythm with frequent PVCs (LBB inferior axis) not closely coupled to the preceding t wave   Assessment and Plan:  1. PVCs The patient has very frequent PVCs.  EKG reveals a LBB inferior axis PVC likely arising from the outflow tract.  He has no significant structural changes on echo and EF is preserved.  PVCs are not closely coupled to the preceding t wave and there are no sustained arrhythmias on holter.  He is asymptomatic with his pvcs.  Recent GXT reveals preserved exercise tolerance.   I will repeat his echo at this time.  If his EF remains preserved then I will see again in 12 months.   He is instructed to contact my office immediately should he develop any symptoms of ventricular ectopy/ chf/ arrhythmias or syncope.

## 2013-03-06 ENCOUNTER — Encounter: Payer: Self-pay | Admitting: Family Medicine

## 2013-03-06 ENCOUNTER — Ambulatory Visit (HOSPITAL_COMMUNITY): Payer: BC Managed Care – PPO | Attending: Internal Medicine | Admitting: Radiology

## 2013-03-06 DIAGNOSIS — I498 Other specified cardiac arrhythmias: Secondary | ICD-10-CM | POA: Insufficient documentation

## 2013-03-06 DIAGNOSIS — I379 Nonrheumatic pulmonary valve disorder, unspecified: Secondary | ICD-10-CM | POA: Insufficient documentation

## 2013-03-06 DIAGNOSIS — I079 Rheumatic tricuspid valve disease, unspecified: Secondary | ICD-10-CM | POA: Insufficient documentation

## 2013-03-06 DIAGNOSIS — I493 Ventricular premature depolarization: Secondary | ICD-10-CM

## 2013-03-06 DIAGNOSIS — Q248 Other specified congenital malformations of heart: Secondary | ICD-10-CM | POA: Insufficient documentation

## 2013-03-06 DIAGNOSIS — I4949 Other premature depolarization: Secondary | ICD-10-CM

## 2013-03-06 NOTE — Progress Notes (Signed)
Echocardiogram performed.  

## 2013-03-10 ENCOUNTER — Telehealth: Payer: Self-pay

## 2013-03-10 NOTE — Telephone Encounter (Signed)
Patient decided he did not want phone message after all

## 2013-05-29 ENCOUNTER — Telehealth: Payer: Self-pay | Admitting: Internal Medicine

## 2013-05-29 NOTE — Telephone Encounter (Signed)
Follow Up    Pt calling to discuss test results. Please call.

## 2013-05-29 NOTE — Telephone Encounter (Signed)
Spoke with patient and let him know echo results

## 2013-10-23 ENCOUNTER — Ambulatory Visit (INDEPENDENT_AMBULATORY_CARE_PROVIDER_SITE_OTHER): Payer: BC Managed Care – PPO | Admitting: Emergency Medicine

## 2013-10-23 ENCOUNTER — Ambulatory Visit
Admission: RE | Admit: 2013-10-23 | Discharge: 2013-10-23 | Disposition: A | Discharge status: Home / Self Care | Payer: BC Managed Care – PPO | Source: Ambulatory Visit | Attending: Emergency Medicine | Admitting: Emergency Medicine

## 2013-10-23 ENCOUNTER — Ambulatory Visit: Payer: Self-pay

## 2013-10-23 VITALS — BP 110/70 | HR 68 | Temp 97.8°F | Resp 18 | Ht 66.25 in | Wt 160.2 lb

## 2013-10-23 DIAGNOSIS — R51 Headache: Secondary | ICD-10-CM

## 2013-10-23 LAB — CBC WITH DIFFERENTIAL/PLATELET
BASOS ABS: 0 10*3/uL (ref 0.0–0.1)
BASOS PCT: 0 % (ref 0–1)
EOS ABS: 0.1 10*3/uL (ref 0.0–0.7)
EOS PCT: 1 % (ref 0–5)
HCT: 38.3 % — ABNORMAL LOW (ref 39.0–52.0)
Hemoglobin: 13.7 g/dL (ref 13.0–17.0)
LYMPHS PCT: 29 % (ref 12–46)
Lymphs Abs: 2.3 10*3/uL (ref 0.7–4.0)
MCH: 28.3 pg (ref 26.0–34.0)
MCHC: 35.8 g/dL (ref 30.0–36.0)
MCV: 79.1 fL (ref 78.0–100.0)
Monocytes Absolute: 0.6 10*3/uL (ref 0.1–1.0)
Monocytes Relative: 7 % (ref 3–12)
Neutro Abs: 5 10*3/uL (ref 1.7–7.7)
Neutrophils Relative %: 63 % (ref 43–77)
PLATELETS: 264 10*3/uL (ref 150–400)
RBC: 4.84 MIL/uL (ref 4.22–5.81)
RDW: 13.9 % (ref 11.5–15.5)
WBC: 8 10*3/uL (ref 4.0–10.5)

## 2013-10-23 LAB — POCT SEDIMENTATION RATE: POCT SED RATE: 6 mm/hr (ref 0–22)

## 2013-10-23 NOTE — Patient Instructions (Signed)
[facility] [address] Ph: [phone]   Driving directions to [facility] 3D2D  [phone]  - more info    [address]  [place], [place] [address]     1. Head south on [address] toward [address]      0.5 mi    2. Sharp left onto [address]      0.6 mi    3. Turn left onto the [employer] E ramp      0.2 mi    4. Merge onto [address] E      3.0 mi    5. Continue straight to stay on [employer] [address] W E      0.4 mi    6. Slight left to stay on [address] W E      1.2 mi    7. Turn right onto [address]      0.1 mi    8. Turn left onto [employer] [address]      361 ft    9. Take the 1st left onto [facility]  Destination will be on the right    Driving directions to [facility] 3D2D  [phone]  - more info    [address]  [place], [place] [address]     1. Head north on [address] toward [employer]      344 ft    2. Turn right onto [employer] [address]      0.3 mi    3. Slight left to stay on [address]      1.7 mi    4. Turn left onto [address]  Destination will be on the right     0.6 mi     [facility]  [address] [place]   Driving directions to [address] [place], [place], [place] [address] 3D2D  - more info    [address]  [place], [place] [address]     1. Head south on [address] toward [address]      0.5 mi    2. Sharp left onto [address]      0.6 mi    3. Turn left onto the [employer] E ramp      0.2 mi    4. Merge onto [address] E      3.0 mi    5. Continue straight to stay on [employer] [address] W E      0.4 mi    6. Slight left to stay on [facility]  Destination will be on the right     1.0 mi     [address] [place], [place] [address]   Driving directions to [facility] 3D2D  [phone]  - more info    [address]  [place], [place] [address]     1. Head south on [address] toward [address]      0.5 mi    2. Sharp left onto [address]      0.6 mi    3. Turn left  onto the [employer] E ramp      0.2 mi    4. Merge onto [address] E      3.0 mi    5. Slight right toward [employer] (signs for [address])      0.2  mi    6. Take the ramp to [employer]      338 ft    7. Turn left onto [address]      0.3 mi    8. Turn left onto [facility] [address]  Destination will be on the right     0.2 mi     [facility]  [address]   Driving directions to [facility] 3D2D  - more info    [address]  [place], [place] [address]     1. Head south on [address] toward [address]      0.7 mi    2. Turn left onto [employer] [address]      0.4 mi    3. Take the 3rd right onto [facility] [address] W      1.1 mi    4. Take the [address] ramp to [facility]      0.2 mi    5. Merge onto [address]      3.7 mi    6. Take exit [address] for [address] toward [place]      0.3 mi    7. Keep left at the fork, follow signs for Airport      381 ft    8. Keep left at the fork, follow signs for [facility] S/[place]      302 ft    9. Turn left onto [address]      2.6 mi    10. Turn right onto [employer] will be on the left     0.2 mi     [facility]

## 2013-10-23 NOTE — Progress Notes (Signed)
[facility] [address] [place] [address] [phone]  Date:  10/23/2013   Name:  Juan Tanner   DOB:  07-Jan-1966   MRN:  [id number]  PCP:  Ruben Reason, MD    Chief Complaint: Headache   History of Present Illness:  Royden Mirkin is a 48 y.o. 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  Says they are bitemporal and stabbing.  Last one minute or less.  Often as 30 times a day.  No history of injury.  No neuro or visual symptoms.  No neck pain.  No antecedent illness.  No medication. No nausea or vomiting.  No cough or coryza.  No improvement with over the counter medications or other home remedies. Denies other complaint or health concern today.   Patient Active Problem List   Diagnosis Date Noted  . Premature ventricular contractions 06/06/2012  . GERD 02/16/2008  . ABDOMINAL PAIN, LEFT UPPER QUADRANT 02/16/2008  . BLOOD IN STOOL, OCCULT 02/16/2008    Past Medical History  Diagnosis Date  . Premature ventricular contraction     outflow tract pvcs  . GERD (gastroesophageal reflux disease)     Past Surgical History  Procedure Laterality Date  . None      History  Substance Use Topics  . Smoking status: Never Smoker   . Smokeless tobacco: Never Used  . Alcohol Use: No    Family History  Problem Relation Age of Onset  . Heart attack Neg Hx     No Known Allergies  Medication list has been reviewed and updated.  No current outpatient prescriptions on file prior to visit.   No current facility-administered medications on file prior to visit.    Review of Systems:  .revj    Physical Examination: Filed Vitals:   10/23/13 1325  BP: 110/70  Pulse: 68  Temp: 97.8 F (36.6 C)  Resp: 18   Filed Vitals:   10/23/13 1325  Height: 5' 6.25" (1.683 m)  Weight: 160 lb 3.2 oz (72.666 kg)   Body mass index is 25.65  kg/(m^2). Ideal Body Weight: Weight in (lb) to have BMI = 25: 155.7  GEN: WDWN, NAD, Non-toxic, A & O x 3 HEENT: Atraumatic, Normocephalic. Neck supple. No masses, No LAD. Ears and Nose: No external deformity. CV: RRR, No M/G/R. No JVD. No thrill. No extra heart sounds. PULM: CTA B, no wheezes, crackles, rhonchi. No retractions. No resp. distress. No accessory muscle use. ABD: S, NT, ND, +BS. No rebound. No HSM. EXTR: No c/c/e NEURO Normal gait.  PSYCH: Normally interactive. Conversant. Not depressed or anxious appearing.  Calm demeanor.    Assessment and Plan: Headache  Signed,  Ellison Carwin, MD   Results for orders placed in visit on 10/23/13  CBC WITH DIFFERENTIAL      Result Value Ref Range   WBC 8.0  4.0 - 10.5 K/uL   RBC 4.84  4.22 - 5.81 MIL/uL   Hemoglobin 13.7  13.0 - 17.0 g/dL   HCT 38.3 (*) 39.0 - 52.0 %   MCV 79.1  78.0 - 100.0 fL   MCH 28.3  26.0 - 34.0 pg   MCHC 35.8  30.0 - 36.0 g/dL   RDW 13.9  11.5 - 15.5 %   Platelets 264  150 - 400 K/uL   Neutrophils Relative % 63  43 - 77 %   Neutro  Abs 5.0  1.7 - 7.7 K/uL   Lymphocytes Relative 29  12 - 46 %   Lymphs Abs 2.3  0.7 - 4.0 K/uL   Monocytes Relative 7  3 - 12 %   Monocytes Absolute 0.6  0.1 - 1.0 K/uL   Eosinophils Relative 1  0 - 5 %   Eosinophils Absolute 0.1  0.0 - 0.7 K/uL   Basophils Relative 0  0 - 1 %   Basophils Absolute 0.0  0.0 - 0.1 K/uL   Smear Review Criteria for review not met    COMPLETE METABOLIC PANEL WITH GFR      Result Value Ref Range   Sodium 138  135 - 145 mEq/L   Potassium 4.1  3.5 - 5.3 mEq/L   Chloride 105  96 - 112 mEq/L   CO2 26  19 - 32 mEq/L   Glucose, Bld 85  70 - 99 mg/dL   BUN 14  6 - 23 mg/dL   Creat 0.74  0.50 - 1.35 mg/dL   Total Bilirubin 0.7  0.2 - 1.2 mg/dL   Alkaline Phosphatase 89  39 - 117 U/L   AST 14  0 - 37 U/L   ALT 15  0 - 53 U/L   Total Protein 7.7  6.0 - 8.3 g/dL   Albumin 4.2  3.5 - 5.2 g/dL   Calcium 9.0  8.4 - 10.5 mg/dL   GFR, Est  African American >89     GFR, Est Non African American >89    POCT SEDIMENTATION RATE      Result Value Ref Range   POCT SED RATE 6  0 - 22 mm/hr

## 2013-10-24 ENCOUNTER — Telehealth: Payer: Self-pay | Admitting: *Deleted

## 2013-10-24 LAB — COMPLETE METABOLIC PANEL WITH GFR
ALK PHOS: 89 U/L (ref 39–117)
ALT: 15 U/L (ref 0–53)
AST: 14 U/L (ref 0–37)
Albumin: 4.2 g/dL (ref 3.5–5.2)
BILIRUBIN TOTAL: 0.7 mg/dL (ref 0.2–1.2)
BUN: 14 mg/dL (ref 6–23)
CO2: 26 mEq/L (ref 19–32)
CREATININE: 0.74 mg/dL (ref 0.50–1.35)
Calcium: 9 mg/dL (ref 8.4–10.5)
Chloride: 105 mEq/L (ref 96–112)
GFR, Est African American: >89 mL/min
GFR, Est Non African American: >89 mL/min
Glucose, Bld: 85 mg/dL (ref 70–99)
Potassium: 4.1 mEq/L (ref 3.5–5.3)
Sodium: 138 mEq/L (ref 135–145)
Total Protein: 7.7 g/dL (ref 6.0–8.3)

## 2013-10-24 MED ORDER — BUTALBITAL-APAP-CAFFEINE 50-325-40 MG PO TABS
1.0000 | ORAL_TABLET | Freq: Four times a day (QID) | ORAL | Status: DC | PRN
Start: 2013-10-24 — End: 2013-10-24

## 2013-10-24 MED ORDER — BUTALBITAL-APAP-CAFFEINE 50-325-40 MG PO TABS: 1.0000 | ORAL_TABLET | Freq: Four times a day (QID) | ORAL | Status: DC | PRN

## 2013-10-24 NOTE — Telephone Encounter (Signed)
Pharmacy did not receive prescription written at 7/20 visit. Resent script.

## 2013-12-30 ENCOUNTER — Encounter: Payer: Self-pay | Admitting: Gastroenterology

## 2014-02-03 ENCOUNTER — Ambulatory Visit (INDEPENDENT_AMBULATORY_CARE_PROVIDER_SITE_OTHER): Payer: BC Managed Care – PPO | Admitting: Emergency Medicine

## 2014-02-03 VITALS — BP 128/82 | HR 70 | Temp 97.8°F | Resp 20 | Ht 66.5 in | Wt 160.0 lb

## 2014-02-03 DIAGNOSIS — K2100 Gastro-esophageal reflux disease with esophagitis, without bleeding: Secondary | ICD-10-CM

## 2014-02-03 LAB — LIPASE: Lipase: 32 U/L (ref 0–75)

## 2014-02-03 LAB — POCT CBC
Granulocyte percent: 47 %G (ref 37–80)
HCT, POC: 41.1 % — AB (ref 43.5–53.7)
Hemoglobin: 13.4 g/dL — AB (ref 14.1–18.1)
Lymph, poc: 3.1 (ref 0.6–3.4)
MCH: 28 pg (ref 27–31.2)
MCHC: 32.7 g/dL (ref 31.8–35.4)
MCV: 85.7 fL (ref 80–97)
MID (CBC): 0.3 (ref 0–0.9)
MPV: 8.8 fL (ref 0–99.8)
PLATELET COUNT, POC: 224 10*3/uL (ref 142–424)
POC Granulocyte: 3 (ref 2–6.9)
POC LYMPH %: 48.8 % (ref 10–50)
POC MID %: 4.2 %M (ref 0–12)
RBC: 4.79 M/uL (ref 4.69–6.13)
RDW, POC: 13.4 %
WBC: 6.3 10*3/uL (ref 4.6–10.2)

## 2014-02-03 LAB — AMYLASE: Amylase: 42 U/L (ref 0–105)

## 2014-02-03 MED ORDER — ESOMEPRAZOLE MAGNESIUM 40 MG PO CPDR: 40.0000 mg | DELAYED_RELEASE_CAPSULE | Freq: Every day | ORAL | Status: DC

## 2014-02-03 MED ORDER — SUCRALFATE 1 G PO TABS: ORAL_TABLET | ORAL | Status: DC

## 2014-02-03 NOTE — Patient Instructions (Signed)
Gastroesophageal Reflux Disease, Adult Gastroesophageal reflux disease (GERD) happens when acid from your stomach flows up into the esophagus. When acid comes in contact with the esophagus, the acid causes soreness (inflammation) in the esophagus. Over time, GERD may create small holes (ulcers) in the lining of the esophagus. CAUSES   Increased body weight. This puts pressure on the stomach, making acid rise from the stomach into the esophagus.  Smoking. This increases acid production in the stomach.  Drinking alcohol. This causes decreased pressure in the lower esophageal sphincter (valve or ring of muscle between the esophagus and stomach), allowing acid from the stomach into the esophagus.  Late evening meals and a full stomach. This increases pressure and acid production in the stomach.  A malformed lower esophageal sphincter. Sometimes, no cause is found. SYMPTOMS   Burning pain in the lower part of the mid-chest behind the breastbone and in the mid-stomach area. This may occur twice a week or more often.  Trouble swallowing.  Sore throat.  Dry cough.  Asthma-like symptoms including chest tightness, shortness of breath, or wheezing. DIAGNOSIS  Your caregiver may be able to diagnose GERD based on your symptoms. In some cases, X-rays and other tests may be done to check for complications or to check the condition of your stomach and esophagus. TREATMENT  Your caregiver may recommend over-the-counter or prescription medicines to help decrease acid production. Ask your caregiver before starting or adding any new medicines.  HOME CARE INSTRUCTIONS   Change the factors that you can control. Ask your caregiver for guidance concerning weight loss, quitting smoking, and alcohol consumption.  Avoid foods and drinks that make your symptoms worse, such as:  Caffeine or alcoholic drinks.  Chocolate.  Peppermint or mint flavorings.  Garlic and onions.  Spicy foods.  Citrus fruits,  such as oranges, lemons, or limes.  Tomato-based foods such as sauce, chili, salsa, and pizza.  Fried and fatty foods.  Avoid lying down for the 3 hours prior to your bedtime or prior to taking a nap.  Eat small, frequent meals instead of large meals.  Wear loose-fitting clothing. Do not wear anything tight around your waist that causes pressure on your stomach.  Raise the head of your bed 6 to 8 inches with wood blocks to help you sleep. Extra pillows will not help.  Only take over-the-counter or prescription medicines for pain, discomfort, or fever as directed by your caregiver.  Do not take aspirin, ibuprofen, or other nonsteroidal anti-inflammatory drugs (NSAIDs). SEEK IMMEDIATE MEDICAL CARE IF:   You have pain in your arms, neck, jaw, teeth, or back.  Your pain increases or changes in intensity or duration.  You develop nausea, vomiting, or sweating (diaphoresis).  You develop shortness of breath, or you faint.  Your vomit is green, yellow, black, or looks like coffee grounds or blood.  Your stool is red, bloody, or black. These symptoms could be signs of other problems, such as heart disease, gastric bleeding, or esophageal bleeding. MAKE SURE YOU:   Understand these instructions.  Will watch your condition.  Will get help right away if you are not doing well or get worse. Document Released: 12/31/2004 Document Revised: 06/15/2011 Document Reviewed: 10/10/2010 ExitCare Patient Information 2015 [employer]. This information is not intended to replace advice given to you by your health care provider. Make sure you discuss any questions you have with your health care provider.  

## 2014-02-03 NOTE — Progress Notes (Signed)
[facility] [address] [place] [address] [phone]  Date:  02/03/2014   Name:  Juan Tanner   DOB:  08/11/1965   MRN:  [id number]  PCP:  Janace Hoard, MD    Chief Complaint: Abdominal Pain   History of Present Illness:  Juan Tanner is a 48 y.o. very pleasant male patient who presents with the following:  Abdominal pain into the back and into chest.  Starts in epigastrium. Worse with eating.  History of GERD and not on meds.  Controlled with nexium Non smoker, no alcohol Denies excess caffeine or NSAID or ASA No cough or coryza, no fever or chills Appetite normal.  No nausea or vomiting.   No shortness of breath or wheezing. No vomiting blood or blood in stools or black stools No weight loss. No improvement with over the counter medications or other home remedies.  Denies other complaint or health concern today.   Patient Active Problem List   Diagnosis Date Noted  . Premature ventricular contractions 06/06/2012  . GERD 02/16/2008  . ABDOMINAL PAIN, LEFT UPPER QUADRANT 02/16/2008  . BLOOD IN STOOL, OCCULT 02/16/2008    Past Medical History  Diagnosis Date  . Premature ventricular contraction     outflow tract pvcs  . GERD (gastroesophageal reflux disease)     Past Surgical History  Procedure Laterality Date  . None      History  Substance Use Topics  . Smoking status: Never Smoker   . Smokeless tobacco: Never Used  . Alcohol Use: No    Family History  Problem Relation Age of Onset  . Heart attack Neg Hx     No Known Allergies  Medication list has been reviewed and updated.  No current outpatient prescriptions on file prior to visit.   No current facility-administered medications on file prior to visit.    Review of Systems:  As per HPI, otherwise negative.    Physical Examination: Filed Vitals:   02/03/14 1219  BP: 128/82  Pulse: 70  Temp: 97.8 F (36.6 C)  Resp: 20   Filed Vitals:   02/03/14 1219   Height: 5' 6.5" (1.689 m)  Weight: 160 lb (72.576 kg)   Body mass index is 25.44 kg/(m^2). Ideal Body Weight: Weight in (lb) to have BMI = 25: 156.9  GEN: WDWN, NAD, Non-toxic, A & O x 3 HEENT: Atraumatic, Normocephalic. Neck supple. No masses, No LAD. Ears and Nose: No external deformity. CV: RRR, No M/G/R. No JVD. No thrill. No extra heart sounds. PULM: CTA B, no wheezes, crackles, rhonchi. No retractions. No resp. distress. No accessory muscle use. ABD: S, NT, ND, +BS. No rebound. No HSM. EXTR: No c/c/e NEURO Normal gait.  PSYCH: Normally interactive. Conversant. Not depressed or anxious appearing.  Calm demeanor.    Assessment and Plan: Epigastric pain GERD Labs nexium carafate  Signed,  Phillips Odor, MD

## 2014-02-05 LAB — H. PYLORI ANTIBODY, IGG: H Pylori IgG: 0.61 {ISR}

## 2014-06-13 ENCOUNTER — Encounter (HOSPITAL_COMMUNITY): Payer: Self-pay | Admitting: Emergency Medicine

## 2014-06-13 ENCOUNTER — Emergency Department (HOSPITAL_COMMUNITY)
Admission: EM | Admit: 2014-06-13 | Discharge: 2014-06-13 | Disposition: A | Discharge status: Home / Self Care | Payer: Worker's Compensation | Attending: Emergency Medicine | Admitting: Emergency Medicine

## 2014-06-13 ENCOUNTER — Emergency Department (HOSPITAL_COMMUNITY): Payer: Worker's Compensation

## 2014-06-13 DIAGNOSIS — S9002XA Contusion of left ankle, initial encounter: Secondary | ICD-10-CM | POA: Diagnosis not present

## 2014-06-13 DIAGNOSIS — S9702XA Crushing injury of left ankle, initial encounter: Secondary | ICD-10-CM | POA: Diagnosis not present

## 2014-06-13 DIAGNOSIS — W51XXXA Accidental striking against or bumped into by another person, initial encounter: Secondary | ICD-10-CM | POA: Insufficient documentation

## 2014-06-13 DIAGNOSIS — K219 Gastro-esophageal reflux disease without esophagitis: Secondary | ICD-10-CM | POA: Insufficient documentation

## 2014-06-13 DIAGNOSIS — Y990 Civilian activity done for income or pay: Secondary | ICD-10-CM | POA: Insufficient documentation

## 2014-06-13 DIAGNOSIS — Z79899 Other long term (current) drug therapy: Secondary | ICD-10-CM | POA: Diagnosis not present

## 2014-06-13 DIAGNOSIS — S99922A Unspecified injury of left foot, initial encounter: Secondary | ICD-10-CM | POA: Diagnosis present

## 2014-06-13 DIAGNOSIS — Y9389 Activity, other specified: Secondary | ICD-10-CM | POA: Insufficient documentation

## 2014-06-13 DIAGNOSIS — Y9289 Other specified places as the place of occurrence of the external cause: Secondary | ICD-10-CM | POA: Insufficient documentation

## 2014-06-13 DIAGNOSIS — Z8679 Personal history of other diseases of the circulatory system: Secondary | ICD-10-CM | POA: Insufficient documentation

## 2014-06-13 MED ORDER — IBUPROFEN 600 MG PO TABS: 600.0000 mg | ORAL_TABLET | Freq: Three times a day (TID) | ORAL | Status: DC | PRN

## 2014-06-13 MED ORDER — OXYCODONE-ACETAMINOPHEN 5-325 MG PO TABS
1.0000 | ORAL_TABLET | Freq: Once | ORAL | Status: AC
  Administered 2014-06-13: 1 via ORAL
  Filled 2014-06-13: qty 1

## 2014-06-13 MED ORDER — IBUPROFEN 400 MG PO TABS
600.0000 mg | ORAL_TABLET | Freq: Once | ORAL | Status: AC
  Administered 2014-06-13: 600 mg via ORAL
  Filled 2014-06-13 (×2): qty 1

## 2014-06-13 MED ORDER — OXYCODONE-ACETAMINOPHEN 5-325 MG PO TABS: 1.0000 | ORAL_TABLET | Freq: Four times a day (QID) | ORAL | Status: DC | PRN

## 2014-06-13 NOTE — ED Notes (Signed)
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

## 2014-06-13 NOTE — Discharge Instructions (Signed)
Use ice and elevation for the first 2 days after injury.  Use crutches as needed for walking to help with pain.  Expect to have pain for the next week.  Return to the emergency department for worsening condition or new concerning symptoms   Cryotherapy Cryotherapy means treatment with cold. Ice or gel packs can be used to reduce both pain and swelling. Ice is the most helpful within the first 24 to 48 hours after an injury or flare-up from overusing a muscle or joint. Sprains, strains, spasms, burning pain, shooting pain, and aches can all be eased with ice. Ice can also be used when recovering from surgery. Ice is effective, has very few side effects, and is safe for most people to use. PRECAUTIONS  Ice is not a safe treatment option for people with:  Raynaud phenomenon. This is a condition affecting small blood vessels in the extremities. Exposure to cold may cause your problems to return.  Cold hypersensitivity. There are many forms of cold hypersensitivity, including:  Cold urticaria. Red, itchy hives appear on the skin when the tissues begin to warm after being iced.  Cold erythema. This is a red, itchy rash caused by exposure to cold.  Cold hemoglobinuria. Red blood cells break down when the tissues begin to warm after being iced. The hemoglobin that carry oxygen are passed into the urine because they cannot combine with blood proteins fast enough.  Numbness or altered sensitivity in the area being iced. If you have any of the following conditions, do not use ice until you have discussed cryotherapy with your caregiver:  Heart conditions, such as arrhythmia, angina, or chronic heart disease.  High blood pressure.  Healing wounds or open skin in the area being iced.  Current infections.  Rheumatoid arthritis.  Poor circulation.  Diabetes. Ice slows the blood flow in the region it is applied. This is beneficial when trying to stop inflamed tissues from spreading irritating  chemicals to surrounding tissues. However, if you expose your skin to cold temperatures for too long or without the proper protection, you can damage your skin or nerves. Watch for signs of skin damage due to cold. HOME CARE INSTRUCTIONS Follow these tips to use ice and cold packs safely.  Place a dry or damp towel between the ice and skin. A damp towel will cool the skin more quickly, so you may need to shorten the time that the ice is used.  For a more rapid response, add gentle compression to the ice.  Ice for no more than 10 to 20 minutes at a time. The bonier the area you are icing, the less time it will take to get the benefits of ice.  Check your skin after 5 minutes to make sure there are no signs of a poor response to cold or skin damage.  Rest 20 minutes or more between uses.  Once your skin is numb, you can end your treatment. You can test numbness by very lightly touching your skin. The touch should be so light that you do not see the skin dimple from the pressure of your fingertip. When using ice, most people will feel these normal sensations in this order: cold, burning, aching, and numbness.  Do not use ice on someone who cannot communicate their responses to pain, such as small children or people with dementia. HOW TO MAKE AN ICE PACK Ice packs are the most common way to use ice therapy. Other methods include ice massage, ice baths, and cryosprays.  Muscle creams that cause a cold, tingly feeling do not offer the same benefits that ice offers and should not be used as a substitute unless recommended by your caregiver. To make an ice pack, do one of the following:  Place crushed ice or a bag of frozen vegetables in a sealable plastic bag. Squeeze out the excess air. Place this bag inside another plastic bag. Slide the bag into a pillowcase or place a damp towel between your skin and the bag.  Mix 3 parts water with 1 part rubbing alcohol. Freeze the mixture in a sealable plastic  bag. When you remove the mixture from the freezer, it will be slushy. Squeeze out the excess air. Place this bag inside another plastic bag. Slide the bag into a pillowcase or place a damp towel between your skin and the bag. SEEK MEDICAL CARE IF:  You develop white spots on your skin. This may give the skin a blotchy (mottled) appearance.  Your skin turns blue or pale.  Your skin becomes waxy or hard.  Your swelling gets worse. MAKE SURE YOU:   Understand these instructions.  Will watch your condition.  Will get help right away if you are not doing well or get worse. Document Released: 11/17/2010 Document Revised: 08/07/2013 Document Reviewed: 11/17/2010 [facility] Patient Information 2015 [place], [place]. This information is not intended to replace advice given to you by your health care provider. Make sure you discuss any questions you have with your health care provider.  Contusion A contusion is a deep bruise. Contusions are the result of an injury that caused bleeding under the skin. The contusion may turn blue, purple, or yellow. Minor injuries will give you a painless contusion, but more severe contusions may stay painful and swollen for a few weeks.  CAUSES  A contusion is usually caused by a blow, trauma, or direct force to an area of the body. SYMPTOMS   Swelling and redness of the injured area.  Bruising of the injured area.  Tenderness and soreness of the injured area.  Pain. DIAGNOSIS  The diagnosis can be made by taking a history and physical exam. An X-ray, CT scan, or MRI may be needed to determine if there were any associated injuries, such as fractures. TREATMENT  Specific treatment will depend on what area of the body was injured. In general, the best treatment for a contusion is resting, icing, elevating, and applying cold compresses to the injured area. Over-the-counter medicines may also be recommended for pain control. Ask your caregiver what the best  treatment is for your contusion. HOME CARE INSTRUCTIONS   Put ice on the injured area.  Put ice in a plastic bag.  Place a towel between your skin and the bag.  Leave the ice on for 15-20 minutes, 3-4 times a day, or as directed by your health care provider.  Only take over-the-counter or prescription medicines for pain, discomfort, or fever as directed by your caregiver. Your caregiver may recommend avoiding anti-inflammatory medicines (aspirin, ibuprofen, and naproxen) for 48 hours because these medicines may increase bruising.  Rest the injured area.  If possible, elevate the injured area to reduce swelling. SEEK IMMEDIATE MEDICAL CARE IF:   You have increased bruising or swelling.  You have pain that is getting worse.  Your swelling or pain is not relieved with medicines. MAKE SURE YOU:   Understand these instructions.  Will watch your condition.  Will get help right away if you are not doing well or get  worse. Document Released: 12/31/2004 Document Revised: 03/28/2013 Document Reviewed: 01/26/2011 [facility] Patient Information 2015 [place], [place]. This information is not intended to replace advice given to you by your health care provider. Make sure you discuss any questions you have with your health care provider.

## 2014-06-13 NOTE — ED Provider Notes (Signed)
CSN: [id number]     Arrival date & time 06/13/14  0122 History  This chart was scribed for Marisa Severin, MD by Bronson Curb, ED Scribe. This patient was seen in room B15C/B15C and the patient's care was started at 2:22 AM.   Chief Complaint  Patient presents with  . Foot Injury    The history is provided by the patient. No language interpreter was used.     HPI Comments: Juan Tanner is a 49 y.o. male who presents to the Emergency Department complaining of left foot injury that occurred PTA. Patient was at work when a coworker accidentally ran over his left foot with a forklift. There is associated constant, 7/10 left foot pain and swelling along with tenderness to the left ankle. No treatments tried PTA. He denies any other injuries.    Past Medical History  Diagnosis Date  . Premature ventricular contraction     outflow tract pvcs  . GERD (gastroesophageal reflux disease)    Past Surgical History  Procedure Laterality Date  . None     Family History  Problem Relation Age of Onset  . Heart attack Neg Hx    History  Substance Use Topics  . Smoking status: Never Smoker   . Smokeless tobacco: Never Used  . Alcohol Use: No    Review of Systems  Constitutional: Negative for fever and chills.  Musculoskeletal: Positive for myalgias, joint swelling and arthralgias.      Allergies  Review of patient's allergies indicates no known allergies.  Home Medications   Prior to Admission medications   Medication Sig Start Date End Date Taking? Authorizing Provider  esomeprazole (NEXIUM) 40 MG capsule Take 1 capsule (40 mg total) by mouth daily. 02/03/14   Carmelina Dane, MD  sucralfate (CARAFATE) 1 G tablet Take one 1 hr ac and hs 02/03/14   Carmelina Dane, MD   Triage Vitals: BP 122/87 mmHg  Pulse 82  Temp(Src) 98 F (36.7 C) (Oral)  Resp 17  Ht 5\' 5"  (1.651 m)  Wt 160 lb (72.576 kg)  BMI 26.63 kg/m2  SpO2 99%  Physical Exam  Constitutional: He is oriented  to person, place, and time. He appears well-developed and well-nourished. No distress.  HENT:  Head: Normocephalic and atraumatic.  Eyes: Conjunctivae and EOM are normal.  Neck: Neck supple. No tracheal deviation present.  Cardiovascular: Normal rate.   Pulmonary/Chest: Effort normal. No respiratory distress.  Musculoskeletal: Normal range of motion. He exhibits edema and tenderness.  Soft tissue swelling on medial an lateral malleoli with tenderness.  Good sensation and good movement.  Neurological: He is alert and oriented to person, place, and time.  Skin: Skin is warm and dry.  Psychiatric: He has a normal mood and affect. His behavior is normal.  Nursing note and vitals reviewed.   ED Course  Procedures (including critical care time)  DIAGNOSTIC STUDIES: Oxygen Saturation is 99% on room air, normal by my interpretation.    COORDINATION OF CARE: At 0223 Discussed treatment plan with patient which includes brace and crutches. Patient agrees.   Labs Review Labs Reviewed - No data to display  Imaging Review Dg Ankle Complete Left  06/13/2014   CLINICAL DATA:  Ankle pain and swelling after injury.  EXAM: LEFT ANKLE COMPLETE - 3+ VIEW  COMPARISON:  None.  FINDINGS: There is no evidence of fracture, dislocation, or joint effusion. There is no evidence of arthropathy or other focal bone abnormality. Soft tissues are unremarkable.  IMPRESSION: Negative.  Electronically Signed   By: Burman Nieves M.D.   On: 06/13/2014 02:10   Dg Foot Complete Left  06/13/2014   CLINICAL DATA:  Pain in the ankle and top of foot after a fork truck ran over the ankle.  EXAM: LEFT FOOT - COMPLETE 3+ VIEW  COMPARISON:  None.  FINDINGS: There is no evidence of fracture or dislocation. There is no evidence of arthropathy or other focal bone abnormality. Soft tissues are unremarkable.  IMPRESSION: Negative.   Electronically Signed   By: Burman Nieves M.D.   On: 06/13/2014 02:10     EKG  Interpretation None      MDM   Final diagnoses:  Crush injury, ankle, left, initial encounter  Contusion, ankle, left, initial encounter    49 year old male with work-related injury tonight.  Forklift versus left ankle.  No fracture seen on x-ray.  Soft tissue swelling on exam.  We'll place him in a Aircast and crutches.  Patient has been instructed on ice and elevation.  He'll be given light duty for 10 days.  I personally performed the services described in this documentation, which was scribed in my presence. The recorded information has been reviewed and is accurate.     Marisa Severin, MD 06/13/14 [phone]

## 2014-08-20 ENCOUNTER — Ambulatory Visit (INDEPENDENT_AMBULATORY_CARE_PROVIDER_SITE_OTHER): Payer: BLUE CROSS/BLUE SHIELD | Admitting: Internal Medicine

## 2014-08-20 VITALS — BP 108/64 | HR 69 | Temp 98.8°F | Resp 14 | Ht 67.0 in | Wt 166.4 lb

## 2014-08-20 DIAGNOSIS — A059 Bacterial foodborne intoxication, unspecified: Secondary | ICD-10-CM

## 2014-08-20 DIAGNOSIS — K529 Noninfective gastroenteritis and colitis, unspecified: Secondary | ICD-10-CM | POA: Diagnosis not present

## 2014-08-20 DIAGNOSIS — R110 Nausea: Secondary | ICD-10-CM | POA: Diagnosis not present

## 2014-08-20 MED ORDER — CILIDINIUM-CHLORDIAZEPOXIDE 2.5-5 MG PO CAPS: 1.0000 | ORAL_CAPSULE | Freq: Three times a day (TID) | ORAL | Status: DC

## 2014-08-20 NOTE — Progress Notes (Signed)
   Subjective:    Patient ID: Juan Tanner, male    DOB: [birth year], 49 y.o.   MRN: [id number]  HPI 49 Year old male complains of abdominal pain, cramps and diarrhea. He ate some old food out of the fridge yesterday and shortly after symptoms started.He has no fever, chills or blood in stool. He does have watery brown stools. Having nausea but no vomiting. He is able to tolorate fluids. He saw Dr. Russella Dar Gastroenterologist years ago fore ulcers and was given Nexium. Last labs in October were good.  Labs reviwed, all normal October.  Review of Systems     Objective:   Physical Exam  Constitutional: He is oriented to person, place, and time. He appears well-developed and well-nourished. No distress.  HENT:  Head: Normocephalic.  Mouth/Throat: Oropharynx is clear and moist.  Eyes: EOM are normal. Pupils are equal, round, and reactive to light. No scleral icterus.  Cardiovascular: Normal rate, regular rhythm and normal heart sounds.   Pulmonary/Chest: Effort normal.  Abdominal: Soft. Normal appearance. He exhibits no distension and no mass. Bowel sounds are increased. There is tenderness in the left lower quadrant. There is no rebound, no guarding and no CVA tenderness.  Musculoskeletal: Normal range of motion.  Neurological: He is alert and oriented to person, place, and time. He exhibits normal muscle tone. Coordination normal.  Psychiatric: He has a normal mood and affect.  Vitals reviewed.         Assessment & Plan:  Food poisoning/Diarrhea/abdominal pain Clear liquids/Peptobismol/Brat RTC 48hrs if not well

## 2014-08-20 NOTE — Patient Instructions (Addendum)
Diarrhea Diarrhea is frequent loose and watery bowel movements. It can cause you to feel weak and dehydrated. Dehydration can cause you to become tired and thirsty, have a dry mouth, and have decreased urination that often is dark yellow. Diarrhea is a sign of another problem, most often an infection that will not last long. In most cases, diarrhea typically lasts 2-3 days. However, it can last longer if it is a sign of something more serious. It is important to treat your diarrhea as directed by your caregiver to lessen or prevent future episodes of diarrhea. CAUSES  Some common causes include:  Gastrointestinal infections caused by viruses, bacteria, or parasites.  Food poisoning or food allergies.  Certain medicines, such as antibiotics, chemotherapy, and laxatives.  Artificial sweeteners and fructose.  Digestive disorders. HOME CARE INSTRUCTIONS  Ensure adequate fluid intake (hydration): Have 1 cup (8 oz) of fluid for each diarrhea episode. Avoid fluids that contain simple sugars or sports drinks, fruit juices, whole milk products, and sodas. Your urine should be clear or pale yellow if you are drinking enough fluids. Hydrate with an oral rehydration solution that you can purchase at pharmacies, retail stores, and online. You can prepare an oral rehydration solution at home by mixing the following ingredients together:   - tsp table salt.   tsp baking soda.   tsp salt substitute containing potassium chloride.  1  tablespoons sugar.  1 L (34 oz) of water.  Certain foods and beverages may increase the speed at which food moves through the gastrointestinal (GI) tract. These foods and beverages should be avoided and include:  Caffeinated and alcoholic beverages.  High-fiber foods, such as raw fruits and vegetables, nuts, seeds, and whole grain breads and cereals.  Foods and beverages sweetened with sugar alcohols, such as xylitol, sorbitol, and mannitol.  Some foods may be well  tolerated and may help thicken stool including:  Starchy foods, such as rice, toast, pasta, low-sugar cereal, oatmeal, grits, baked potatoes, crackers, and bagels.  Bananas.  Applesauce.  Add probiotic-rich foods to help increase healthy bacteria in the GI tract, such as yogurt and fermented milk products.  Wash your hands well after each diarrhea episode.  Only take over-the-counter or prescription medicines as directed by your caregiver.  Take a warm bath to relieve any burning or pain from frequent diarrhea episodes. SEEK IMMEDIATE MEDICAL CARE IF:   You are unable to keep fluids down.  You have persistent vomiting.  You have blood in your stool, or your stools are black and tarry.  You do not urinate in 6-8 hours, or there is only a small amount of very dark urine.  You have abdominal pain that increases or localizes.  You have weakness, dizziness, confusion, or light-headedness.  You have a severe headache.  Your diarrhea gets worse or does not get better.  You have a fever or persistent symptoms for more than 2-3 days.  You have a fever and your symptoms suddenly get worse. MAKE SURE YOU:   Understand these instructions.  Will watch your condition.  Will get help right away if you are not doing well or get worse. Document Released: 03/13/2002 Document Revised: 08/07/2013 Document Reviewed: 11/29/2011 [facility] Patient Information 2015 [place], [place]. This information is not intended to replace advice given to you by your health care provider. Make sure you discuss any questions you have with your health care provider. Food Poisoning Food poisoning is an illness caused by something you ate or drank. There are over  250 known causes of food poisoning. However, many other causes are unknown.You can be treated even if the exact cause of your food poisoning is not known. In most cases, food poisoning is mild and lasts 1 to 2 days. However, some cases can be serious,  especially for people with low immune systems, the elderly, children and infants, and pregnant women. CAUSES  Poor personal hygiene, improper cleaning of storage and preparation areas, and unclean utensils can cause infection or tainting (contamination) of foods. The causes of food poisoning are numerous.Infectious agents, such as viruses, bacteria, or parasites, can cause harm by infecting the intestine and disrupting the absorption of nutrients and water. This can cause diarrhea and lead to dehydration. Viruses are responsible for most of the food poisonings in which an agent is found. Parasites are less likely to cause food poisoning. Toxic agents, such as poisonous mushrooms, marine algae, and pesticides can also cause food poisoning.  Viral causes of food poisoning include:  Norovirus.  Rotavirus.  Hepatitis A.  Bacterial causes of food poisoning include:  Salmonellae.  Campylobacter.  Bacillus cereus.  Escherichia coli (E. coli).  Shigella.  Listeria monocytogenes.  Clostridium botulinum (botulism).  Vibrio cholerae.  Parasites that can cause food poisoning include:  Giardia.  Cryptosporidium.  Toxoplasma. SYMPTOMS Symptoms may appear several hours or longer after consuming the contaminated food or drink. Symptoms may include:  Nausea.  Vomiting.  Cramping.  Diarrhea.  Fever and chills.  Muscle aches. DIAGNOSIS Your health care provider may be able to diagnose food poisoning from a list of what you have recently eaten and results from lab tests. Diagnostic tests may include an exam of the feces. TREATMENT In most cases, treatment focuses on helping to relieve your symptoms and staying well hydrated. Antibiotic medicines are rarely needed. In severe cases, hospitalization may be required. HOME CARE INSTRUCTIONS   Drink enough water and fluids to keep your urine clear or pale yellow. Drink small amounts of fluids frequently and increase as  tolerated.  Ask your health care provider for specific rehydration instructions.  Avoid:  Foods high in sugar.  Alcohol.  Carbonated drinks.  Tobacco.  Juice.  Caffeine drinks.  Extremely hot or cold fluids.  Fatty, greasy foods.  Too much intake of anything at one time.  Dairy products until 24 to 48 hours after diarrhea stops.  You may consume probiotics. Probiotics are active cultures of beneficial bacteria. They may lessen the amount and number of diarrheal stools in adults. Probiotics can be found in yogurt with active cultures and in supplements.  Wash your hands well to avoid spreading the bacteria.  Take medicines only as directed by your health care provider. Do not give your child aspirin because of the association with Reye's syndrome.  Ask your health care provider if you should continue to take your regular prescribed and over-the-counter medicines. PREVENTION   Wash your hands, food preparation surfaces, and utensils thoroughly before and after handling raw foods.  Keep refrigerated foods below 1F (5C).  Serve hot foods immediately or keep them heated above 11F (60C).  Divide large volumes of food into small portions for rapid cooling in the refrigerator. Hot, bulky foods in the refrigerator can raise the temperature of other foods that have already cooled.  Follow approved canning procedures.  Heat canned foods thoroughly before tasting.  When in doubt, throw it out.  Infants, the elderly, women who are pregnant, and people with compromised immune systems are especially susceptible to food poisoning. These  people should never consume unpasteurized cheese, unpasteurized cider, raw fish, raw seafood, or raw meat-type products. SEEK IMMEDIATE MEDICAL CARE IF:   You have difficulty breathing, swallowing, talking, or moving.  You develop blurred vision.  You are unable to keep fluids down.  You faint or nearly faint.  Your eyes turn  yellow.  Vomiting or diarrhea develops or becomes persistent.  Abdominal pain develops, increases, or localizes in one small area.  You have a fever.  The diarrhea becomes excessive or contains blood or mucus.  You develop excessive weakness, dizziness, or extreme thirst.  You have no urine for 8 hours. MAKE SURE YOU:   Understand these instructions.  Will watch your condition.  Will get help right away if you are not doing well or get worse. Document Released: 12/20/2003 Document Revised: 08/07/2013 Document Reviewed: 08/07/2010 [facility] Patient Information 2015 [place], [place]. This information is not intended to replace advice given to you by your health care provider. Make sure you discuss any questions you have with your health care provider. Food Choices to Help Relieve Diarrhea When you have diarrhea, the foods you eat and your eating habits are very important. Choosing the right foods and drinks can help relieve diarrhea. Also, because diarrhea can last up to 7 days, you need to replace lost fluids and electrolytes (such as sodium, potassium, and chloride) in order to help prevent dehydration.  WHAT GENERAL GUIDELINES DO I NEED TO FOLLOW?  Slowly drink 1 cup (8 oz) of fluid for each episode of diarrhea. If you are getting enough fluid, your urine will be clear or pale yellow.  Eat starchy foods. Some good choices include white rice, white toast, pasta, low-fiber cereal, baked potatoes (without the skin), saltine crackers, and bagels.  Avoid large servings of any cooked vegetables.  Limit fruit to two servings per day. A serving is  cup or 1 small piece.  Choose foods with less than 2 g of fiber per serving.  Limit fats to less than 8 tsp (38 g) per day.  Avoid fried foods.  Eat foods that have probiotics in them. Probiotics can be found in certain dairy products.  Avoid foods and beverages that may increase the speed at which food moves through the stomach and  intestines (gastrointestinal tract). Things to avoid include:  High-fiber foods, such as dried fruit, raw fruits and vegetables, nuts, seeds, and whole grain foods.  Spicy foods and high-fat foods.  Foods and beverages sweetened with high-fructose corn syrup, honey, or sugar alcohols such as xylitol, sorbitol, and mannitol. WHAT FOODS ARE RECOMMENDED? Grains White rice. White, [place], or pita breads (fresh or toasted), including plain rolls, buns, or bagels. White pasta. Saltine, soda, or graham crackers. Pretzels. Low-fiber cereal. Cooked cereals made with water (such as cornmeal, farina, or cream cereals). Plain muffins. Matzo. Melba toast. Zwieback.  Vegetables Potatoes (without the skin). Strained tomato and vegetable juices. Most well-cooked and canned vegetables without seeds. Tender lettuce. Fruits Cooked or canned applesauce, apricots, cherries, fruit cocktail, grapefruit, peaches, pears, or plums. Fresh bananas, apples without skin, cherries, grapes, cantaloupe, grapefruit, peaches, oranges, or plums.  Meat and Other Protein Products Baked or boiled chicken. Eggs. Tofu. Fish. Seafood. Smooth peanut butter. Ground or well-cooked tender beef, ham, veal, lamb, pork, or poultry.  Dairy Plain yogurt, kefir, and unsweetened liquid yogurt. Lactose-free milk, buttermilk, or soy milk. Plain hard cheese. Beverages Sport drinks. Clear broths. Diluted fruit juices (except prune). Regular, caffeine-free sodas such as ginger ale. Water. Decaffeinated teas. Oral  rehydration solutions. Sugar-free beverages not sweetened with sugar alcohols. Other Bouillon, broth, or soups made from recommended foods.  The items listed above may not be a complete list of recommended foods or beverages. Contact your dietitian for more options. WHAT FOODS ARE NOT RECOMMENDED? Grains Whole grain, whole wheat, bran, or rye breads, rolls, pastas, crackers, and cereals. Wild or brown rice. Cereals that contain more than 2  g of fiber per serving. Corn tortillas or taco shells. Cooked or dry oatmeal. Granola. Popcorn. Vegetables Raw vegetables. Cabbage, broccoli, Brussels sprouts, artichokes, baked beans, beet greens, corn, kale, legumes, peas, sweet potatoes, and yams. Potato skins. Cooked spinach and cabbage. Fruits Dried fruit, including raisins and dates. Raw fruits. Stewed or dried prunes. Fresh apples with skin, apricots, mangoes, pears, raspberries, and strawberries.  Meat and Other Protein Products Chunky peanut butter. Nuts and seeds. Beans and lentils. Tomasa Blase.  Dairy High-fat cheeses. Milk, chocolate milk, and beverages made with milk, such as milk shakes. Cream. Ice cream. Sweets and Desserts Sweet rolls, doughnuts, and sweet breads. Pancakes and waffles. Fats and Oils Butter. Cream sauces. Margarine. Salad oils. Plain salad dressings. Olives. Avocados.  Beverages Caffeinated beverages (such as coffee, tea, soda, or energy drinks). Alcoholic beverages. Fruit juices with pulp. Prune juice. Soft drinks sweetened with high-fructose corn syrup or sugar alcohols. Other Coconut. Hot sauce. Chili powder. Mayonnaise. Gravy. Cream-based or milk-based soups.  The items listed above may not be a complete list of foods and beverages to avoid. Contact your dietitian for more information. WHAT SHOULD I DO IF I BECOME DEHYDRATED? Diarrhea can sometimes lead to dehydration. Signs of dehydration include dark urine and dry mouth and skin. If you think you are dehydrated, you should rehydrate with an oral rehydration solution. These solutions can be purchased at pharmacies, retail stores, or online.  Drink -1 cup (120-240 mL) of oral rehydration solution each time you have an episode of diarrhea. If drinking this amount makes your diarrhea worse, try drinking smaller amounts more often. For example, drink 1-3 tsp (5-15 mL) every 5-10 minutes.  A general rule for staying hydrated is to drink 1-2 L of fluid per day. Talk to  your health care provider about the specific amount you should be drinking each day. Drink enough fluids to keep your urine clear or pale yellow. Document Released: 06/13/2003 Document Revised: 03/28/2013 Document Reviewed: 02/13/2013 [facility] Patient Information 2015 [place], [place]. This information is not intended to replace advice given to you by your health care provider. Make sure you discuss any questions you have with your health care provider.

## 2015-02-26 ENCOUNTER — Ambulatory Visit (INDEPENDENT_AMBULATORY_CARE_PROVIDER_SITE_OTHER): Payer: BLUE CROSS/BLUE SHIELD | Admitting: Family Medicine

## 2015-02-26 VITALS — BP 120/80 | HR 57 | Temp 98.3°F | Resp 16 | Ht 67.0 in | Wt 170.0 lb

## 2015-02-26 DIAGNOSIS — Z23 Encounter for immunization: Secondary | ICD-10-CM | POA: Diagnosis not present

## 2015-02-26 DIAGNOSIS — K2100 Gastro-esophageal reflux disease with esophagitis, without bleeding: Secondary | ICD-10-CM

## 2015-02-26 DIAGNOSIS — Z130 Encounter for screening for diseases of the blood and blood-forming organs and certain disorders involving the immune mechanism: Secondary | ICD-10-CM

## 2015-02-26 DIAGNOSIS — Z0000 Encounter for general adult medical examination without abnormal findings: Secondary | ICD-10-CM

## 2015-02-26 DIAGNOSIS — Z136 Encounter for screening for cardiovascular disorders: Secondary | ICD-10-CM

## 2015-02-26 DIAGNOSIS — Z1389 Encounter for screening for other disorder: Secondary | ICD-10-CM

## 2015-02-26 DIAGNOSIS — Z1329 Encounter for screening for other suspected endocrine disorder: Secondary | ICD-10-CM | POA: Diagnosis not present

## 2015-02-26 DIAGNOSIS — Z1383 Encounter for screening for respiratory disorder NEC: Secondary | ICD-10-CM | POA: Diagnosis not present

## 2015-02-26 DIAGNOSIS — Z113 Encounter for screening for infections with a predominantly sexual mode of transmission: Secondary | ICD-10-CM | POA: Diagnosis not present

## 2015-02-26 LAB — CBC
HCT: 40.3 % (ref 39.0–52.0)
Hemoglobin: 13.7 g/dL (ref 13.0–17.0)
MCH: 28 pg (ref 26.0–34.0)
MCHC: 34 g/dL (ref 30.0–36.0)
MCV: 82.4 fL (ref 78.0–100.0)
MPV: 10.9 fL (ref 8.6–12.4)
PLATELETS: 268 10*3/uL (ref 150–400)
RBC: 4.89 MIL/uL (ref 4.22–5.81)
RDW: 13.1 % (ref 11.5–15.5)
WBC: 5 10*3/uL (ref 4.0–10.5)

## 2015-02-26 LAB — COMPREHENSIVE METABOLIC PANEL
ALK PHOS: 93 U/L (ref 40–115)
ALT: 14 U/L (ref 9–46)
AST: 15 U/L (ref 10–40)
Albumin: 4.2 g/dL (ref 3.6–5.1)
BUN: 13 mg/dL (ref 7–25)
CALCIUM: 9.1 mg/dL (ref 8.6–10.3)
CHLORIDE: 103 mmol/L (ref 98–110)
CO2: 27 mmol/L (ref 20–31)
Creat: 0.67 mg/dL (ref 0.60–1.35)
Glucose, Bld: 92 mg/dL (ref 65–99)
POTASSIUM: 4.6 mmol/L (ref 3.5–5.3)
Sodium: 138 mmol/L (ref 135–146)
Total Bilirubin: 0.7 mg/dL (ref 0.2–1.2)
Total Protein: 7.4 g/dL (ref 6.1–8.1)

## 2015-02-26 LAB — POC MICROSCOPIC URINALYSIS (UMFC): Mucus: ABSENT

## 2015-02-26 LAB — POCT URINALYSIS DIP (MANUAL ENTRY)
Bilirubin, UA: NEGATIVE
Blood, UA: NEGATIVE
GLUCOSE UA: NEGATIVE
Ketones, POC UA: NEGATIVE
LEUKOCYTES UA: NEGATIVE
NITRITE UA: NEGATIVE
Protein Ur, POC: NEGATIVE
Spec Grav, UA: 1.015
Urobilinogen, UA: 0.2
pH, UA: 7.5

## 2015-02-26 LAB — LIPID PANEL
CHOL/HDL RATIO: 4.8 ratio (ref <=5.0)
Cholesterol: 171 mg/dL (ref 125–200)
HDL: 36 mg/dL — ABNORMAL LOW (ref >=40)
LDL CALC: 93 mg/dL (ref <130)
TRIGLYCERIDES: 212 mg/dL — AB (ref <150)
VLDL: 42 mg/dL — AB (ref <30)

## 2015-02-26 LAB — HEPATITIS C ANTIBODY: HCV AB: NEGATIVE

## 2015-02-26 LAB — HIV ANTIBODY (ROUTINE TESTING W REFLEX): HIV: NONREACTIVE

## 2015-02-26 LAB — TSH: TSH: 0.719 u[IU]/mL (ref 0.350–4.500)

## 2015-02-26 LAB — RPR

## 2015-02-26 MED ORDER — ESOMEPRAZOLE MAGNESIUM 40 MG PO CPDR: 40.0000 mg | DELAYED_RELEASE_CAPSULE | Freq: Every day | ORAL | Status: DC

## 2015-02-26 NOTE — Patient Instructions (Signed)

## 2015-02-26 NOTE — Progress Notes (Addendum)
Subjective:  This chart was scribed for Norberto Sorenson MD, by Veverly Fells, at [facility].  This patient was seen in room 3 and the patient's care was started at 8:50 AM.   Chief Complaint  Patient presents with  . Annual Exam    Complete Physical  . Immunizations    flu     Patient ID: Juan Tanner, male    DOB: 04/29/1965, 49 y.o.   MRN: [id number]  HPI HPI Comments: Juan Tanner is a 49 y.o. male who presents to the [facility] for a complete physical exam.  He has not eaten today. Patient no longer has any Nexium ( has been out of it for the past two months) and complains of mild heartburn occasionally.  He feels that he is doing well when on this medication.  He saw a gastroenterologist "a long time ago" and states that he would be willing to see them again if he has abdominal complaints. He is not taking any over the counter medications.  Denies nausea/vomitting, weight change, blood in stool.  He does not smoke or drink alcohol. 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   Immunization/ screening: He has not yet received his flu shot and is willing to get one today as well as be screened for STI's.  He would also like a tetanus shot today.   ----- Had a negative serum H pylori last year.   He had a complete cardiac work up two years ago and has not had complete STI testing for over three years.      Past Medical History  Diagnosis Date  . Premature ventricular contraction     outflow tract pvcs  . GERD (gastroesophageal reflux disease)     Current Outpatient Prescriptions on File Prior to Visit  Medication Sig Dispense Refill  . clidinium-chlordiazePOXIDE (LIBRAX) 5-2.5 MG per capsule Take 1 capsule by mouth 3 (three) times daily before meals. (Patient not taking: Reported on 02/26/2015) 30 capsule 3  . esomeprazole (NEXIUM) 40 MG capsule Take 1 capsule (40 mg total) by mouth daily. (Patient not taking: Reported on 02/26/2015) 30  capsule 5  . ibuprofen (ADVIL,MOTRIN) 600 MG tablet Take 1 tablet (600 mg total) by mouth every 8 (eight) hours as needed for mild pain or moderate pain. (Patient not taking: Reported on 08/20/2014) 30 tablet 0  . oxyCODONE-acetaminophen (PERCOCET/ROXICET) 5-325 MG per tablet Take 1-2 tablets by mouth every 6 (six) hours as needed for severe pain. (Patient not taking: Reported on 08/20/2014) 20 tablet 0  . sucralfate (CARAFATE) 1 G tablet Take one 1 hr ac and hs (Patient not taking: Reported on 08/20/2014) 120 tablet 0   No current facility-administered medications on file prior to visit.    No Known Allergies  Past Surgical History  Procedure Laterality Date  . None     Family History  Problem Relation Age of Onset  . Heart attack Neg Hx    Social History   Social History  . Marital Status: Married    Spouse Name: N/A  . Number of Children: N/A  . Years of Education: N/A   Social History Main Topics  . Smoking status: Never Smoker   . Smokeless tobacco: Never Used  . Alcohol Use: No  . Drug Use: No  . Sexual Activity: Not Asked   Other Topics Concern  . None   Social History Narrative   Lives in [place] with spouse and children.  Works in [employer]     Review of Systems  Constitutional: Negative for appetite change and unexpected weight change.  Eyes: Negative for redness.  Respiratory: Negative for cough, choking and shortness of breath.   Gastrointestinal: Negative for nausea, vomiting and blood in stool.  Musculoskeletal: Negative for neck pain and neck stiffness.  Neurological: Negative for syncope and speech difficulty.  All other systems reviewed and are negative.      Objective:   Physical Exam  Constitutional: He is oriented to person, place, and time. He appears well-developed and well-nourished. No distress.  HENT:  Head: Normocephalic and atraumatic.  Right Ear: External ear normal.  Left Ear: External ear normal.  Mouth/Throat:  Oropharynx is clear and moist.  Eyes: Pupils are equal, round, and reactive to light.  Neck: Normal range of motion.  No cervical adenopathy Question of mildly enlarged thyroid.   Cardiovascular: Normal rate, regular rhythm and normal heart sounds.  Exam reveals no gallop and no friction rub.   No murmur heard. Pulmonary/Chest: Effort normal and breath sounds normal. No respiratory distress. He has no wheezes. He has no rales.  Abdominal: Soft. Bowel sounds are normal. He exhibits no distension. There is no tenderness.  Musculoskeletal: Normal range of motion.  Neurological: He is alert and oriented to person, place, and time.  Skin: Skin is warm and dry.  Psychiatric: He has a normal mood and affect.    Filed Vitals:   02/26/15 0839  BP: 120/80  Pulse: 57  Temp: 98.3 F (36.8 C)  TempSrc: Oral  Resp: 16  Height: 5\' 7"  (1.702 m)  Weight: 170 lb (77.111 kg)  SpO2: 98%         Assessment & Plan:   1. Annual physical exam   2. Routine screening for STI (sexually transmitted infection)   3. Screening for cardiovascular, respiratory, and genitourinary diseases   4. Screening for deficiency anemia   5. Screening for thyroid disorder   6. Gastroesophageal reflux disease with esophagitis   7. Need for prophylactic vaccination and inoculation against influenza   8. Need for Tdap vaccination     Orders Placed This Encounter  Procedures  . GC/Chlamydia Probe Amp  . Flu Vaccine QUAD 36+ mos IM  . Tdap vaccine greater than or equal to 7yo IM  . CBC  . HIV antibody  . TSH  . RPR  . Comprehensive metabolic panel    Order Specific Question:  Has the patient fasted?    Answer:  Yes  . Lipid panel    Order Specific Question:  Has the patient fasted?    Answer:  Yes  . Hepatitis C antibody  . POCT urinalysis dipstick  . POCT Microscopic Urinalysis ([facility])    Meds ordered this encounter  Medications  . esomeprazole (NEXIUM) 40 MG capsule    Sig: Take 1 capsule (40 mg  total) by mouth daily.    Dispense:  30 capsule    Refill:  11    I personally performed the services described in this documentation, which was scribed in my presence. The recorded information has been reviewed and considered, and addended by me as needed.  , MD MPH

## 2015-02-27 ENCOUNTER — Encounter: Payer: Self-pay | Admitting: Family Medicine

## 2015-02-27 LAB — GC/CHLAMYDIA PROBE AMP
CT Probe RNA: NEGATIVE
GC Probe RNA: NEGATIVE

## 2015-08-28 ENCOUNTER — Ambulatory Visit (INDEPENDENT_AMBULATORY_CARE_PROVIDER_SITE_OTHER): Payer: BLUE CROSS/BLUE SHIELD

## 2015-08-28 ENCOUNTER — Ambulatory Visit (INDEPENDENT_AMBULATORY_CARE_PROVIDER_SITE_OTHER): Payer: BLUE CROSS/BLUE SHIELD | Admitting: Physician Assistant

## 2015-08-28 VITALS — BP 118/70 | HR 64 | Temp 97.3°F | Resp 18 | Ht 67.0 in | Wt 169.0 lb

## 2015-08-28 DIAGNOSIS — R059 Cough, unspecified: Secondary | ICD-10-CM

## 2015-08-28 DIAGNOSIS — R05 Cough: Secondary | ICD-10-CM

## 2015-08-28 NOTE — Patient Instructions (Signed)
     IF you received an x-ray today, you will receive an invoice from [facility]. Please contact [facility] at [phone] with questions or concerns regarding your invoice.   IF you received labwork today, you will receive an invoice from [facility]. Please contact [facility] at [phone] with questions or concerns regarding your invoice.   Our billing staff will not be able to assist you with questions regarding bills from these companies.  You will be contacted with the lab results as soon as they are available. The fastest way to get your results is to activate your My Chart account. Instructions are located on the last page of this paperwork. If you have not heard from us regarding the results in 2 weeks, please contact this office.      

## 2015-08-28 NOTE — Progress Notes (Signed)
08/28/2015 2:02 PM   DOB: 1966/01/13 / MRN: [id number]  SUBJECTIVE:  Juan Tanner is a 50 y.o. male presenting for pleuritic chest pain.  Reports this has been present for about a week and he has had a mild cough for about 3 weeks now.  Denies a history of asthma.  He denies SOB and new DOE.  He feels that he is no worse or better.  He has not tried anything for his symptoms. He works in a factory with a lot of dust. He has a history of GERD however he takes PPI and has had no problems with this.    He has No Known Allergies.   He  has a past medical history of Premature ventricular contraction and GERD (gastroesophageal reflux disease).    He  reports that he has never smoked. He has never used smokeless tobacco. He reports that he does not drink alcohol or use illicit drugs. He  has no sexual activity history on file. The patient  has past surgical history that includes none.  His family history is negative for Heart attack.  Review of Systems  Constitutional: Negative for fever and chills.  Eyes: Negative for blurred vision.  Respiratory: Negative for cough and shortness of breath.   Cardiovascular: Negative for chest pain.  Gastrointestinal: Negative for nausea and abdominal pain.  Genitourinary: Negative for dysuria, urgency and frequency.  Musculoskeletal: Negative for myalgias.  Skin: Negative for rash.  Neurological: Negative for dizziness, tingling and headaches.  Psychiatric/Behavioral: Negative for depression. The patient is not nervous/anxious.     Problem list and medications reviewed and updated by myself where necessary, and exist elsewhere in the encounter.   OBJECTIVE:  BP 118/70 mmHg  Pulse 64  Temp(Src) 97.3 F (36.3 C) (Oral)  Resp 18  Ht 5\' 7"  (1.702 m)  Wt 169 lb (76.658 kg)  BMI 26.46 kg/m2  SpO2 99%  Physical Exam  Constitutional: He is oriented to person, place, and time. He appears well-developed. He does not appear ill.  Eyes: Conjunctivae and  EOM are normal. Pupils are equal, round, and reactive to light.  Cardiovascular: Normal rate and regular rhythm.   Pulmonary/Chest: Effort normal and breath sounds normal. No respiratory distress. He has no wheezes. He has no rales. He exhibits no tenderness.  Abdominal: He exhibits no distension.  Musculoskeletal: Normal range of motion.  Neurological: He is alert and oriented to person, place, and time. No cranial nerve deficit. Coordination normal.  Skin: Skin is warm and dry. He is not diaphoretic.  Psychiatric: He has a normal mood and affect.  Nursing note and vitals reviewed.   No results found for this or any previous visit (from the past 72 hour(s)).  Dg Chest 2 View  08/28/2015  CLINICAL DATA:  Cough and chest pain for 3 weeks EXAM: CHEST  2 VIEW COMPARISON:  May 09, 2012 FINDINGS: The heart size and mediastinal contours are within normal limits. There is no focal infiltrate, pulmonary edema, or pleural effusion. The visualized skeletal structures are unremarkable. IMPRESSION: No active cardiopulmonary disease. Electronically Signed   By: May 11, 2012 M.D.   On: 08/28/2015 13:50    ASSESSMENT AND PLAN  Juan Tanner was seen today for cough.  Diagnoses and all orders for this visit:  Cough: His vitals are great.  Chest rads normal.  PFT's normal for his age.  Provided reassurance that this will pass.   -     DG Chest 2 View; Future  The patient was advised to call or return to clinic if he does not see an improvement in symptoms or to seek the care of the closest emergency department if he worsens with the above plan.   Deliah Boston, MHS, PA-C [facility] 08/28/2015 2:02 PM

## 2016-04-06 ENCOUNTER — Encounter (HOSPITAL_COMMUNITY): Payer: Self-pay | Admitting: Emergency Medicine

## 2016-04-06 ENCOUNTER — Emergency Department (HOSPITAL_COMMUNITY): Payer: Managed Care, Other (non HMO)

## 2016-04-06 ENCOUNTER — Emergency Department (HOSPITAL_COMMUNITY)
Admission: EM | Admit: 2016-04-06 | Discharge: 2016-04-06 | Disposition: A | Discharge status: Home / Self Care | Payer: Managed Care, Other (non HMO) | Attending: Emergency Medicine | Admitting: Emergency Medicine

## 2016-04-06 DIAGNOSIS — R079 Chest pain, unspecified: Secondary | ICD-10-CM

## 2016-04-06 DIAGNOSIS — Z79899 Other long term (current) drug therapy: Secondary | ICD-10-CM | POA: Insufficient documentation

## 2016-04-06 DIAGNOSIS — R51 Headache: Secondary | ICD-10-CM | POA: Diagnosis not present

## 2016-04-06 DIAGNOSIS — R519 Headache, unspecified: Secondary | ICD-10-CM

## 2016-04-06 LAB — COMPREHENSIVE METABOLIC PANEL
ALT: 26 U/L (ref 17–63)
AST: 27 U/L (ref 15–41)
Albumin: 3.6 g/dL (ref 3.5–5.0)
Alkaline Phosphatase: 97 U/L (ref 38–126)
Anion gap: 9 (ref 5–15)
BILIRUBIN TOTAL: 0.9 mg/dL (ref 0.3–1.2)
BUN: 11 mg/dL (ref 6–20)
CO2: 23 mmol/L (ref 22–32)
Calcium: 9.1 mg/dL (ref 8.9–10.3)
Chloride: 106 mmol/L (ref 101–111)
Creatinine, Ser: 0.9 mg/dL (ref 0.61–1.24)
GFR calc Af Amer: >60 mL/min (ref >60)
GFR calc non Af Amer: >60 mL/min (ref >60)
Glucose, Bld: 95 mg/dL (ref 65–99)
Potassium: 3.6 mmol/L (ref 3.5–5.1)
Sodium: 138 mmol/L (ref 135–145)
TOTAL PROTEIN: 7.7 g/dL (ref 6.5–8.1)

## 2016-04-06 LAB — CBC
HEMATOCRIT: 38.9 % — AB (ref 39.0–52.0)
HEMOGLOBIN: 13 g/dL (ref 13.0–17.0)
MCH: 27.6 pg (ref 26.0–34.0)
MCHC: 33.4 g/dL (ref 30.0–36.0)
MCV: 82.6 fL (ref 78.0–100.0)
Platelets: 254 10*3/uL (ref 150–400)
RBC: 4.71 MIL/uL (ref 4.22–5.81)
RDW: 13.5 % (ref 11.5–15.5)
WBC: 12.2 10*3/uL — ABNORMAL HIGH (ref 4.0–10.5)

## 2016-04-06 LAB — I-STAT TROPONIN, ED
TROPONIN I, POC: 0 ng/mL (ref 0.00–0.08)
Troponin i, poc: 0 ng/mL (ref 0.00–0.08)

## 2016-04-06 MED ORDER — KETOROLAC TROMETHAMINE 30 MG/ML IJ SOLN
30.0000 mg | Freq: Once | INTRAMUSCULAR | Status: AC
  Administered 2016-04-06: 30 mg via INTRAVENOUS
  Filled 2016-04-06: qty 1

## 2016-04-06 MED ORDER — DIPHENHYDRAMINE HCL 50 MG/ML IJ SOLN
25.0000 mg | Freq: Once | INTRAMUSCULAR | Status: AC
  Administered 2016-04-06: 25 mg via INTRAVENOUS
  Filled 2016-04-06: qty 1

## 2016-04-06 MED ORDER — METOCLOPRAMIDE HCL 5 MG/ML IJ SOLN
10.0000 mg | Freq: Once | INTRAMUSCULAR | Status: AC
  Administered 2016-04-06: 10 mg via INTRAVENOUS
  Filled 2016-04-06: qty 2

## 2016-04-06 MED ORDER — NAPROXEN 500 MG PO TABS: 500.0000 mg | ORAL_TABLET | Freq: Two times a day (BID) | ORAL | 0 refills | Status: DC

## 2016-04-06 MED ORDER — SODIUM CHLORIDE 0.9 % IV BOLUS (SEPSIS)
1000.0000 mL | Freq: Once | INTRAVENOUS | Status: AC
  Administered 2016-04-06: 1000 mL via INTRAVENOUS

## 2016-04-06 NOTE — Discharge Instructions (Signed)
Tests were good. Medication for pain. Follow-up your primary care doctor or return if worse

## 2016-04-06 NOTE — ED Triage Notes (Signed)
Pt to ER by private vehicle for evaluation of central chest pressure onset yesterday while at rest. Reports associated shortness of breath and nausea. Denies medical hx. Appears to be in NAD. Uninterested in exam/answering questions.

## 2016-04-06 NOTE — ED Notes (Signed)
Pt verbalizes understanding of discharge information. NAD. Denies pain. Ambulatory at discharge, politely refused a wheelchair.

## 2016-04-07 NOTE — ED Provider Notes (Signed)
[facility]-EMER[facility]Y DEPT Provider Note   CSN: [id number] Arrival date & time: 04/06/16  0747     History   Chief Complaint Chief Complaint  Patient presents with  . Chest Pain  . Headache    HPI Juan Tanner is a 51 y.o. male.  Patient complains of general headache for 24 hours c associated chest pain. Chest pain is intermittent without dyspnea, diaphoresis, nausea. No history of cardiac disease. No neuro deficits, fever, chills, stiff neck. Nonsmoker. Severity of symptoms is mild to moderate.  Nothing makes symptoms better or worse      Past Medical History:  Diagnosis Date  . GERD (gastroesophageal reflux disease)   . Premature ventricular contraction    outflow tract pvcs    Patient Active Problem List   Diagnosis Date Noted  . Premature ventricular contractions 06/06/2012  . GERD 02/16/2008  . ABDOMINAL PAIN, LEFT UPPER QUADRANT 02/16/2008  . BLOOD IN STOOL, OCCULT 02/16/2008    Past Surgical History:  Procedure Laterality Date  . none         Home Medications    Prior to Admission medications   Medication Sig Start Date End Date Taking? Authorizing Provider  esomeprazole (NEXIUM) 40 MG capsule Take 1 capsule (40 mg total) by mouth daily. Patient not taking: Reported on 04/06/2016 02/26/15   Sherren Mocha, MD  naproxen (NAPROSYN) 500 MG tablet Take 1 tablet (500 mg total) by mouth 2 (two) times daily. 04/06/16   Donnetta Hutching, MD    Family History Family History  Problem Relation Age of Onset  . Heart attack Neg Hx     Social History Social History  Substance Use Topics  . Smoking status: Never Smoker  . Smokeless tobacco: Never Used  . Alcohol use No     Allergies   Patient has no known allergies.   Review of Systems Review of Systems  All other systems reviewed and are negative.    Physical Exam Updated Vital Signs BP 109/66   Pulse 70   Temp 99 F (37.2 C) (Oral)   Resp 24   Ht 5\' 5"  (1.651 m)   Wt 160 lb (72.6 kg)   SpO2 96%   BMI  26.63 kg/m   Physical Exam  Constitutional: He is oriented to person, place, and time. He appears well-developed and well-nourished.  HENT:  Head: Normocephalic and atraumatic.  Eyes: Conjunctivae are normal.  Neck: Neck supple.  Cardiovascular: Normal rate and regular rhythm.   Pulmonary/Chest: Effort normal and breath sounds normal.  Abdominal: Soft. Bowel sounds are normal.  Musculoskeletal: Normal range of motion.  Neurological: He is alert and oriented to person, place, and time.  Skin: Skin is warm and dry.  Psychiatric: He has a normal mood and affect. His behavior is normal.  Nursing note and vitals reviewed.    ED Treatments / Results  Labs (all labs ordered are listed, but only abnormal results are displayed) Labs Reviewed  CBC - Abnormal; Notable for the following:       Result Value   WBC 12.2 (*)    HCT 38.9 (*)    All other components within normal limits  COMPREHENSIVE METABOLIC PANEL  , ED  Rosezena Sensor, ED    EKG  EKG Interpretation  Date/Time:  Monday April 06 2016 07:57:33 EST Ventricular Rate:  96 PR Interval:    QRS Duration: 84 QT Interval:  325 QTC Calculation: 411 R Axis:   61 Text Interpretation:  Sinus rhythm Consider right  atrial enlargement Borderline T abnormalities, inferior leads Confirmed by Adriana Simas  MD, Tyneshia Stivers ([id number]) on 04/06/2016 8:56:36 AM       Radiology Dg Chest 2 View  Result Date: 04/06/2016 CLINICAL DATA:  Pt c/o central chest pain and pressure, headache, and fever for 2 days. Pt denies cough or congestion. Nonsmoker. EXAM: CHEST  2 VIEW COMPARISON:  08/28/2015 FINDINGS: The heart size and mediastinal contours are within normal limits. Both lungs are clear. No pleural effusion or pneumothorax. The visualized skeletal structures are unremarkable. IMPRESSION: No active cardiopulmonary disease. Electronically Signed   By: Amie Portland M.D.   On: 04/06/2016 08:50    Procedures Procedures (including critical  care time)  Medications Ordered in ED Medications  sodium chloride 0.9 % bolus 1,000 mL (0 mLs Intravenous Stopped 04/06/16 1017)  diphenhydrAMINE (BENADRYL) injection 25 mg (25 mg Intravenous Given 04/06/16 0858)  ketorolac (TORADOL) 30 MG/ML injection 30 mg (30 mg Intravenous Given 04/06/16 0857)  metoCLOPramide (REGLAN) injection 10 mg (10 mg Intravenous Given 04/06/16 0859)     Initial Impression / Assessment and Plan / ED Course  I have reviewed the triage vital signs and the nursing notes.  Pertinent labs & imaging results that were available during my care of the patient were reviewed by me and considered in my medical decision making (see chart for details).  Clinical Course     Patient is low risk for ACS or PE. He feels better after IV fluids, IV Benadryl, IV Reglan, IV Toradol. Discharge medications Naprosyn 500 mg  Final Clinical Impressions(s) / ED Diagnoses   Final diagnoses:  Chest pain, unspecified type  Acute intractable headache, unspecified headache type    New Prescriptions Discharge Medication List as of 04/06/2016 11:06 AM    START taking these medications   Details  naproxen (NAPROSYN) 500 MG tablet Take 1 tablet (500 mg total) by mouth 2 (two) times daily., Starting Mon 04/06/2016, Print         Donnetta Hutching, MD 04/07/16 [phone]

## 2017-04-19 ENCOUNTER — Ambulatory Visit: Payer: BLUE CROSS/BLUE SHIELD | Admitting: Physician Assistant

## 2017-04-19 ENCOUNTER — Ambulatory Visit (INDEPENDENT_AMBULATORY_CARE_PROVIDER_SITE_OTHER): Payer: BLUE CROSS/BLUE SHIELD

## 2017-04-19 ENCOUNTER — Other Ambulatory Visit: Payer: Self-pay

## 2017-04-19 ENCOUNTER — Encounter: Payer: Self-pay | Admitting: Physician Assistant

## 2017-04-19 VITALS — BP 112/80 | HR 56 | Temp 98.2°F | Resp 16 | Ht 65.0 in | Wt 184.2 lb

## 2017-04-19 DIAGNOSIS — R0789 Other chest pain: Secondary | ICD-10-CM | POA: Diagnosis not present

## 2017-04-19 DIAGNOSIS — K219 Gastro-esophageal reflux disease without esophagitis: Secondary | ICD-10-CM | POA: Diagnosis not present

## 2017-04-19 DIAGNOSIS — R8271 Bacteriuria: Secondary | ICD-10-CM

## 2017-04-19 DIAGNOSIS — R079 Chest pain, unspecified: Secondary | ICD-10-CM | POA: Diagnosis not present

## 2017-04-19 LAB — POCT URINALYSIS DIP (MANUAL ENTRY)
GLUCOSE UA: NEGATIVE mg/dL
Nitrite, UA: NEGATIVE
Protein Ur, POC: 30 mg/dL — AB
Spec Grav, UA: >=1.03 — AB (ref 1.010–1.025)
UROBILINOGEN UA: 1 U/dL
pH, UA: 5 (ref 5.0–8.0)

## 2017-04-19 LAB — POCT CBC
Granulocyte percent: 51.8 %G (ref 37–80)
HCT, POC: 42.8 % — AB (ref 43.5–53.7)
Hemoglobin: 14.3 g/dL (ref 14.1–18.1)
LYMPH, POC: 3 (ref 0.6–3.4)
MCH: 37.6 pg — AB (ref 27–31.2)
MCHC: 33.4 g/dL (ref 31.8–35.4)
MCV: 82.6 fL (ref 80–97)
MID (CBC): 0.2 (ref 0–0.9)
MPV: 9.5 fL (ref 0–99.8)
POC Granulocyte: 3.4 (ref 2–6.9)
POC LYMPH %: 44.8 % (ref 10–50)
POC MID %: 3.4 %M (ref 0–12)
Platelet Count, POC: 242 10*3/uL (ref 142–424)
RBC: 5.18 M/uL (ref 4.69–6.13)
RDW, POC: 14.8 %
WBC: 6.6 10*3/uL (ref 4.6–10.2)

## 2017-04-19 LAB — POCT GLYCOSYLATED HEMOGLOBIN (HGB A1C): Hemoglobin A1C: 5.6

## 2017-04-19 MED ORDER — PANTOPRAZOLE SODIUM 40 MG PO TBEC: 40.0000 mg | DELAYED_RELEASE_TABLET | Freq: Every day | ORAL | 0 refills | Status: DC

## 2017-04-19 NOTE — Progress Notes (Signed)
04/19/2017 3:45 PM   DOB: Sep 16, 1965 / MRN: [id number]  SUBJECTIVE:  Juan Tanner is a 52 y.o. male in no obvious distress presenting for multiple symptoms.  He complains of his right leg bothering him.  He denies swelling about this area tells me is not joint pain.  He denies rash about the area.  It seems to get worse when he eats.  He associates her GERD-like symptoms sensation in his chest that does worsen about 30 minutes after eating.  He has had to take Nexium for this in the past, however he has not tried that yet for this illness.  He denies shortness of breath.  The chart shows he also has a history of premature ventricular contraction.  He tells me he is a never smoker and he does not drink alcohol.  He denies smoking any other substances.  He was seen in the ED for similar complaint about a year ago now and labs, including a troponin were negative.  He takes no medicines currently.  He has No Known Allergies.   He  has a past medical history of GERD (gastroesophageal reflux disease) and Premature ventricular contraction.    He  reports that  has never smoked. he has never used smokeless tobacco. He reports that he does not drink alcohol or use drugs. He  has no sexual activity history on file. The patient  has a past surgical history that includes none.  His family history is not on file.  Review of Systems  Respiratory: Negative for cough, hemoptysis, sputum production, shortness of breath and wheezing.   Cardiovascular: Positive for palpitations. Negative for chest pain, orthopnea, claudication, leg swelling and PND.  Gastrointestinal: Positive for abdominal pain and heartburn. Negative for blood in stool, constipation, diarrhea, melena, nausea and vomiting.    The problem list and medications were reviewed and updated by myself where necessary and exist elsewhere in the encounter.   OBJECTIVE:  BP 112/80 (BP Location: Right Arm, Patient Position: Sitting, Cuff Size: Normal)    Pulse (!) 56   Temp 98.2 F (36.8 C) (Oral)   Resp 16   Ht 5\' 5"  (1.651 m)   Wt 184 lb 3.2 oz (83.6 kg)   SpO2 99%   BMI 30.65 kg/m   Physical Exam  Constitutional: He appears well-developed. He is active and cooperative.  Non-toxic appearance.  He is well-appearing.  In no obvious distress.  Cardiovascular: Normal heart sounds. An irregular rhythm present. Frequent extrasystoles are present. PMI is not displaced. Exam reveals no gallop, no friction rub and no decreased pulses.  No murmur heard. Pulmonary/Chest: Effort normal. No tachypnea.  Musculoskeletal:       Legs: Neurological: He is alert.  Skin: Skin is warm and dry. He is not diaphoretic. No pallor.  Vitals reviewed.   Results for orders placed or performed in visit on 04/19/17 (from the past 72 hour(s))  POCT CBC     Status: Abnormal   Collection Time: 04/19/17  2:57 PM  Result Value Ref Range   WBC 6.6 4.6 - 10.2 K/uL   Lymph, poc 3.0 0.6 - 3.4   POC LYMPH PERCENT 44.8 10 - 50 %L   MID (cbc) 0.2 0 - 0.9   POC MID % 3.4 0 - 12 %M   POC Granulocyte 3.4 2 - 6.9   Granulocyte percent 51.8 37 - 80 %G   RBC 5.18 4.69 - 6.13 M/uL   Hemoglobin 14.3 14.1 - 18.1 g/dL  HCT, POC 42.8 (A) 43.5 - 53.7 %   MCV 82.6 80 - 97 fL   MCH, POC 37.6 (A) 27 - 31.2 pg   MCHC 33.4 31.8 - 35.4 g/dL   RDW, POC 50.9 %   Platelet Count, POC 242 142 - 424 K/uL   MPV 9.5 0 - 99.8 fL  POCT urinalysis dipstick     Status: Abnormal   Collection Time: 04/19/17  3:00 PM  Result Value Ref Range   Color, UA yellow yellow   Clarity, UA cloudy (A) clear   Glucose, UA negative negative mg/dL   Bilirubin, UA moderate (A) negative   Ketones, POC UA small (15) (A) negative mg/dL   Spec Grav, UA >=3.267 (A) 1.010 - 1.025   Blood, UA small (A) negative   pH, UA 5.0 5.0 - 8.0   Protein Ur, POC =30 (A) negative mg/dL   Urobilinogen, UA 1.0 0.2 or 1.0 E.U./dL   Nitrite, UA Negative Negative   Leukocytes, UA Small (1+) (A) Negative  POCT  glycosylated hemoglobin (Hb A1C)     Status: None   Collection Time: 04/19/17  3:15 PM  Result Value Ref Range   Hemoglobin A1C 5.6    EKG: In his with frequent preventricular contractions.  T wave inversion in V3 however this is not mood  Dg Chest 2 View  Result Date: 04/19/2017 CLINICAL DATA:  Atypical pleuritic chest pain.  Nonsmoker. EXAM: CHEST  2 VIEW COMPARISON:  Chest x-ray of April 06, 2016 FINDINGS: The lungs are well-expanded and clear. The heart and pulmonary vascularity are normal. The mediastinum is normal in width. There is no pleural effusion. The bony thorax is unremarkable. IMPRESSION: There is no active cardiopulmonary disease. Electronically Signed   By: David  [place] M.D.   On: 04/19/2017 15:17    ASSESSMENT AND PLAN:  Juan Tanner was seen today for gastroesophageal reflux.  Diagnoses and all orders for this visit:  Atypical chest pain: 52 year old male here with chief complaint of leg pain, chest pain, and epigastric.  He is well-appearing.  He has no history of DVT or PE and there are no abnormalities with regard to his leg.  He does have an irregular rhythm however this is not new for him. Given high frequency of PVC will send him back to cards, however patient tells me he had a negative stress test about 5 years ago.   We will add an H. pylori breath test today.  He needs to be back on his PPI.  I will plan to start him on pantoprazole 40 mg before breakfast and see him back in 3 weeks to check his progress.  RTC and ED precautions discussed thoroughly with patient. -     EKG 12-Lead -     DG Chest 2 View; Future -     POCT CBC -     POCT urinalysis dipstick -     Basic Metabolic Panel -     H. pylori breath test       -     AMB cardiology, given frequent PVCs.    The patient is advised to call or return to clinic if he does not see an improvement in symptoms, or to seek the care of the closest emergency department if he worsens with the above plan.   Deliah Boston,  MHS, PA-C [facility] 04/19/2017 3:45 PM

## 2017-04-19 NOTE — Patient Instructions (Addendum)
If at any point he becomes severely short of breath, become so dizzy that you may pass out, or begin to feel a pressure sensation in your chest then please go straight to the nearest emergency department for evaluation.  Please keep your phone on your as we will be calling about your cardiology referral.    IF you received an x-ray today, you will receive an invoice from [facility]. Please contact [facility] at [phone] with questions or concerns regarding your invoice.   IF you received labwork today, you will receive an invoice from [place]. Please contact [facility] at [phone] with questions or concerns regarding your invoice.   Our billing staff will not be able to assist you with questions regarding bills from these companies.  You will be contacted with the lab results as soon as they are available. The fastest way to get your results is to activate your My Chart account. Instructions are located on the last page of this paperwork. If you have not heard from [place] regarding the results in 2 weeks, please contact this office.

## 2017-04-20 LAB — BASIC METABOLIC PANEL
BUN / CREAT RATIO: 11 (ref 9–20)
BUN: 9 mg/dL (ref 6–24)
CO2: 24 mmol/L (ref 20–29)
CREATININE: 0.84 mg/dL (ref 0.76–1.27)
Calcium: 9.1 mg/dL (ref 8.7–10.2)
Chloride: 107 mmol/L — ABNORMAL HIGH (ref 96–106)
GFR calc Af Amer: 117 mL/min/{1.73_m2} (ref >59)
GFR calc non Af Amer: 101 mL/min/{1.73_m2} (ref >59)
Glucose: 91 mg/dL (ref 65–99)
Potassium: 4.4 mmol/L (ref 3.5–5.2)
Sodium: 144 mmol/L (ref 134–144)

## 2017-04-20 LAB — LIPID PANEL
Chol/HDL Ratio: 6.2 ratio — ABNORMAL HIGH (ref 0.0–5.0)
Cholesterol, Total: 212 mg/dL — ABNORMAL HIGH (ref 100–199)
HDL: 34 mg/dL — ABNORMAL LOW
LDL Calculated: 100 mg/dL — ABNORMAL HIGH (ref 0–99)
Triglycerides: 391 mg/dL — ABNORMAL HIGH (ref 0–149)
VLDL Cholesterol Cal: 78 mg/dL — ABNORMAL HIGH (ref 5–40)

## 2017-04-20 LAB — URINE CULTURE: Organism ID, Bacteria: NO GROWTH

## 2017-04-20 LAB — LIPASE: Lipase: 32 U/L (ref 13–78)

## 2017-04-20 LAB — H. PYLORI BREATH TEST: H pylori Breath Test: NEGATIVE

## 2017-04-20 LAB — TSH: TSH: 0.721 u[IU]/mL (ref 0.450–4.500)

## 2017-05-28 ENCOUNTER — Other Ambulatory Visit: Payer: Self-pay | Admitting: Physician Assistant

## 2017-05-28 DIAGNOSIS — K219 Gastro-esophageal reflux disease without esophagitis: Secondary | ICD-10-CM

## 2017-06-07 ENCOUNTER — Ambulatory Visit: Payer: BLUE CROSS/BLUE SHIELD | Admitting: Internal Medicine

## 2017-06-07 ENCOUNTER — Encounter: Payer: Self-pay | Admitting: Internal Medicine

## 2017-06-07 VITALS — BP 126/70 | HR 69 | Ht 65.0 in | Wt 184.0 lb

## 2017-06-07 DIAGNOSIS — R002 Palpitations: Secondary | ICD-10-CM | POA: Diagnosis not present

## 2017-06-07 DIAGNOSIS — I493 Ventricular premature depolarization: Secondary | ICD-10-CM | POA: Diagnosis not present

## 2017-06-07 DIAGNOSIS — K219 Gastro-esophageal reflux disease without esophagitis: Secondary | ICD-10-CM

## 2017-06-07 NOTE — Patient Instructions (Signed)

## 2017-06-07 NOTE — Progress Notes (Signed)
Electrophysiology Office Note   Date:  06/07/2017   ID:  Juan Tanner, DOB [birth year], MRN [id number]  PCP:  Peyton Najjar, MD    Primary Electrophysiologist: Hillis Range, MD    CC: palpitations   History of Present Illness: Juan Tanner is a 52 y.o. male who presents today for electrophysiology evaluation.   He presents today to re-establish for further evaluation and management of palpitations/ PVCs.  I previously saw him in 2014.  He has not been seen since that time.  Primary Care has instructed him to return.  He is actually doing quite well.  No symptoms of arrhythmia.  He has GERD symptoms with spicy foods but denies any exertional chest pain, or exercise induced symptoms.  He was seen in the ED 04/06/16 with symptoms chest pain.  He says he has done well since that time.  Today, he denies symptoms of palpitations, exertional chest pain, shortness of breath, orthopnea, PND, lower extremity edema, claudication, dizziness, presyncope, syncope, bleeding, or neurologic sequela. The patient is tolerating medications without difficulties and is otherwise without complaint today.    Past Medical History:  Diagnosis Date  . GERD (gastroesophageal reflux disease)   . Premature ventricular contraction    outflow tract pvcs   Past Surgical History:  Procedure Laterality Date  . none       Current Outpatient Medications  Medication Sig Dispense Refill  . pantoprazole (PROTONIX) 40 MG tablet TAKE 1 TABLET BY MOUTH DAILY. TAKE 30 MINUTES BEFORE BREAKFAST. DO NOT MISS DOSES (Patient not taking: Reported on 06/07/2017) 42 tablet 0   No current facility-administered medications for this visit.     Allergies:   Patient has no known allergies.   Social History:  The patient  reports that  has never smoked. he has never used smokeless tobacco. He reports that he does not drink alcohol or use drugs.   Family History:  The patient denies FH of arrhythmia or sudden death   ROS:   Please see the history of present illness.   All other systems are personally reviewed and negative.    PHYSICAL EXAM: VS:  BP 126/70   Pulse 69   Ht 5\' 5"  (1.651 m)   Wt 184 lb (83.5 kg)   BMI 30.62 kg/m  , BMI Body mass index is 30.62 kg/m. GEN: Well nourished, well developed, in no acute distress  HEENT: normal  Neck: no JVD, carotid bruits, or masses Cardiac: RRR; no murmurs, rubs, or gallops,no edema  Respiratory:  clear to auscultation bilaterally, normal work of breathing GI: soft, nontender, nondistended, + BS MS: no deformity or atrophy  Skin: warm and dry  Neuro:  Strength and sensation are intact Psych: euthymic mood, full affect  EKG:  EKG is ordered today. The ekg ordered today is personally reviewed and shows sinus rhythm 69 bpm, PR 168 msec, QRS 102, Qtc 417 msec, LVH   Recent Labs: 04/19/2017: BUN 9; Creatinine, Ser 0.84; Hemoglobin 14.3; Potassium 4.4; Sodium 144; TSH 0.721  personally reviewed   Lipid Panel     Component Value Date/Time   CHOL 212 (H) 04/19/2017 1702   TRIG 391 (H) 04/19/2017 1702   HDL 34 (L) 04/19/2017 1702   CHOLHDL 6.2 (H) 04/19/2017 1702   CHOLHDL 4.8 02/26/2015 0923   VLDL 42 (H) 02/26/2015 0923   LDLCALC 100 (H) 04/19/2017 1702   personally reviewed   Wt Readings from Last 3 Encounters:  06/07/17 184 lb (83.5 kg)  04/19/17 184 lb  3.2 oz (83.6 kg)  04/06/16 160 lb (72.6 kg)      Other studies personally reviewed: Additional studies/ records that were reviewed today include: prior echo, prior ED visit, my prior office notes  Review of the above records today demonstrates: as above   ASSESSMENT AND PLAN:  1.  Palpitations/ PVCs Appear clinically improved No PVCs on ekg or exam today Echo 2014 reveals no structural abnormality,  Denies FH of sudden death/ arrhythmias  2. Atypical chest pain GERD symptoms only Improved with PPI No further workup at this time   Follow-up:  Return to see EP PA annually  Current  medicines are reviewed at length with the patient today.   The patient does not have concerns regarding his medicines.  The following changes were made today:  None   Signed, Hillis Range, MD  06/07/2017 3:21 PM     [facility] [address] [place] [address] [phone] (office) [phone] (fax)

## 2017-12-06 NOTE — Progress Notes (Signed)
Juan Tanner  MRN: [id number] DOB: Apr 25, 1965  Subjective:   Juan Tanner is a 52 y.o. male who presents for evaluation of intermittent abdominal pain. Onset was 2 weeks ago. Symptoms have been unchanged. The pain is described as cramping, and is 6/10 in intensity. Pain is located in the suprapubic region without radiation.  Aggravating factors: sitting.  Alleviating factors: standing. Will sometimes have nausea, heartburn, and burning with urination. Notes his urine is more yellow than it typically is.  The more yellow the urine, the more it burns.  Once urine gets lighter in color, the burning subsides.  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 When he drives, he does typically drink more cokes compared to water.  The patient denies anorexia, fever, arthralagias, belching, chills, constipation, diarrhea, dysuria, flatus, frequency, headache, hematochezia, hematuria, melena, myalgias, sweats, rectal pain, testicular pain/swelling, penile discharge, and vomiting. Last normal BM was yesterday, typically has one every other day. Denies alcohol use. Denies NSAID use.  He is sexually active with monogamous wife.  Has PMH of GERD. Used to take nexium but has not taken it in a while.   Review of Systems  Per HPI  Patient Active Problem List   Diagnosis Date Noted  . Premature ventricular contractions 06/06/2012  . GERD 02/16/2008  . ABDOMINAL PAIN, LEFT UPPER QUADRANT 02/16/2008  . BLOOD IN STOOL, OCCULT 02/16/2008    Current Outpatient Medications on File Prior to Visit  Medication Sig Dispense Refill  . pantoprazole (PROTONIX) 40 MG tablet TAKE 1 TABLET BY MOUTH DAILY. TAKE 30 MINUTES BEFORE BREAKFAST. DO NOT MISS DOSES (Patient not taking: Reported on 06/07/2017) 42 tablet 0   No current facility-administered medications on file prior to visit.     No Known Allergies    Social History   Socioeconomic History  . Marital status: Married    Spouse name: Not on file  .  Number of children: 4  . Years of education: Not on file  . Highest education level: Not on file  Occupational History  . Not on file  Social Needs  . Financial resource strain: Not on file  . Food insecurity:    Worry: Not on file    Inability: Not on file  . Transportation needs:    Medical: Not on file    Non-medical: Not on file  Tobacco Use  . Smoking status: Never Smoker  . Smokeless tobacco: Never Used  Substance and Sexual Activity  . Alcohol use: No  . Drug use: No  . Sexual activity: Yes  Lifestyle  . Physical activity:    Days per week: Not on file    Minutes per session: Not on file  . Stress: Not on file  Relationships  . Social connections:    Talks on phone: Not on file    Gets together: Not on file    Attends religious service: Not on file    Active member of club or organization: Not on file    Attends meetings of clubs or organizations: Not on file    Relationship status: Not on file  . Intimate partner violence:    Fear of current or ex partner: Not on file    Emotionally abused: Not on file    Physically abused: Not on file    Forced sexual activity: Not on file  Other Topics Concern  . Not on file  Social History Narrative   Lives in [place] with spouse and children.  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 He appears well-developed and well-nourished. No distress.  HENT:  Head: Normocephalic and atraumatic.  Mouth/Throat: Uvula is midline, oropharynx is clear and moist and mucous membranes are normal. No tonsillar exudate.  Eyes: Conjunctivae are normal.  Neck: Normal range of motion.  Cardiovascular: Normal rate, regular rhythm and normal heart sounds.  Pulmonary/Chest: Effort normal and breath sounds  normal.  Abdominal: Soft. Normal appearance and bowel sounds are normal. He exhibits no distension. There is no hepatosplenomegaly. There is tenderness (mild TTP in suprapubic region). There is no rigidity, no rebound, no guarding, no CVA tenderness, no tenderness at McBurney's point and negative Murphy's sign. No hernia.  Genitourinary: Testes normal and penis normal. Right testis shows no mass, no swelling and no tenderness. Left testis shows no mass, no swelling and no tenderness. Circumcised. No penile tenderness. No discharge found.  Genitourinary Comments: CMA chaperone present for GU exam.   Neurological: He is alert and oriented to person, place, and time.  Skin: Skin is warm and dry.  Psychiatric: He has a normal mood and affect.  Vitals reviewed.     Results for orders placed or performed in visit on 12/07/17 (from the past 24 hour(s))  POCT urinalysis dipstick     Status: Abnormal   Collection Time: 12/07/17  3:38 PM  Result Value Ref Range   Color, UA yellow yellow   Clarity, UA cloudy (A) clear   Glucose, UA negative negative mg/dL   Bilirubin, UA small (A) negative   Ketones, POC UA trace (5) (A) negative mg/dL   Spec Grav, UA 1.025 1.010 - 1.025   Blood, UA trace-intact (A) negative   pH, UA 6.0 5.0 - 8.0   Protein Ur, POC trace (A) negative mg/dL   Urobilinogen, UA 1.0 0.2 or 1.0 E.U./dL   Nitrite, UA Negative Negative   Leukocytes, UA Negative Negative  POCT CBC     Status: Abnormal   Collection Time: 12/07/17  3:46 PM  Result Value Ref Range   WBC 9.3 4.6 - 10.2 K/uL   Lymph, poc 3.7 (A) 0.6 - 3.4   POC LYMPH PERCENT 40.3 10 - 50 %L   MID (cbc) 0.3 0 - 0.9   POC MID % 3.4 0 - 12 %M   POC Granulocyte 5.2 2 - 6.9   Granulocyte percent 56.3 37 - 80 %G   RBC 5.60 4.69 - 6.13 M/uL   Hemoglobin 15.6 14.1 - 18.1 g/dL   HCT, POC 47.7 43.5 - 53.7 %   MCV 85.0 80 - 97 fL   MCH, POC 27.9 27 - 31.2 pg   MCHC 32.8 31.8 - 35.4 g/dL   RDW, POC 13.6 %   Platelet Count,  POC 307 142 - 424 K/uL   MPV 8.2 0 - 99.8 fL  POCT Microscopic Urinalysis ([facility])     Status: Abnormal   Collection Time: 12/07/17  3:52 PM  Result Value Ref Range   WBC,UR,HPF,POC None None WBC/hpf   RBC,UR,HPF,POC None None RBC/hpf   Bacteria None None, Too numerous to count   Mucus Absent Absent   Epithelial Cells, UR Per Microscopy  Few (A) None, Too numerous to count cells/hpf   Dg Abd 1 View  Result Date: 12/07/2017 CLINICAL DATA:  52 year old male with suprapubic pain for the past 2 weeks. Initial encounter. EXAM: ABDOMEN - 1 VIEW COMPARISON:  07/07/2012 CT. FINDINGS: Unremarkable bowel gas pattern without evidence of obstruction. Mild to moderate amount of stool rectosigmoid region. The possibility of free intraperitoneal air cannot be assessed on a supine view. Probable phlebolith left aspect of the pelvis. Minimal sclerosis upper sacroiliac joints. IMPRESSION: Unremarkable bowel gas pattern. Electronically Signed   By: Genia Del M.D.   On: 12/07/2017 15:57   Assessment and Plan :  1. Constipation, unspecified constipation type Pt is overall well appearing, NAD. Vitals stable. Mild TTP with palpation of suprapubic region. Hx suspicious for dehydration secondary to decreased water consumption and increased coca cola consumption along with constipation. UA cloudy, no nitrites, leukocytes, or WBCs.  POC CBC normal.  Plain film of abdomen with mild to moderate amount of stool in rectosigmoid region.  Recommended increase water consumption, decrease coca cola consumption, increase fiber in diet, and use over-the-counter Colace and MiraLAX as needed for constipation.  Advised to return to clinic if symptoms worsen, do not improve, or as needed. Given strict ED precautions.   2. Dehydration 3. Suprapubic pain - POCT CBC - POCT Microscopic Urinalysis ([facility]) - POCT urinalysis dipstick - CMP14+EGFR - DG Abd 1 View; Future  4. Dysuria His description of burning with urination is most  consistent with dehydration.  Suspicion for STD is low.  Labs pending. - GC/Chlamydia Probe Amp - Trichomonas vaginalis, RNA - Hepatitis panel, acute - HIV antibody - RPR  Tenna Delaine PA-C  [facility] 12/07/2017 4:01 PM

## 2017-12-07 ENCOUNTER — Ambulatory Visit: Payer: BLUE CROSS/BLUE SHIELD | Admitting: Physician Assistant

## 2017-12-07 ENCOUNTER — Encounter: Payer: Self-pay | Admitting: Physician Assistant

## 2017-12-07 ENCOUNTER — Ambulatory Visit (INDEPENDENT_AMBULATORY_CARE_PROVIDER_SITE_OTHER): Payer: BLUE CROSS/BLUE SHIELD

## 2017-12-07 ENCOUNTER — Other Ambulatory Visit: Payer: Self-pay

## 2017-12-07 VITALS — BP 122/70 | HR 77 | Temp 98.3°F | Resp 20 | Ht 67.32 in | Wt 175.4 lb

## 2017-12-07 DIAGNOSIS — R300 Dysuria: Secondary | ICD-10-CM | POA: Diagnosis not present

## 2017-12-07 DIAGNOSIS — R1030 Lower abdominal pain, unspecified: Secondary | ICD-10-CM | POA: Diagnosis not present

## 2017-12-07 DIAGNOSIS — R102 Pelvic and perineal pain: Secondary | ICD-10-CM

## 2017-12-07 DIAGNOSIS — K5900 Constipation, unspecified: Secondary | ICD-10-CM

## 2017-12-07 DIAGNOSIS — E860 Dehydration: Secondary | ICD-10-CM | POA: Diagnosis not present

## 2017-12-07 LAB — POCT URINALYSIS DIP (MANUAL ENTRY)
Glucose, UA: NEGATIVE mg/dL
Leukocytes, UA: NEGATIVE
NITRITE UA: NEGATIVE
PH UA: 6 (ref 5.0–8.0)
Spec Grav, UA: 1.025 (ref 1.010–1.025)
UROBILINOGEN UA: 1 U/dL

## 2017-12-07 LAB — POCT CBC
GRANULOCYTE PERCENT: 56.3 % (ref 37–80)
HEMATOCRIT: 47.7 % (ref 43.5–53.7)
Hemoglobin: 15.6 g/dL (ref 14.1–18.1)
LYMPH, POC: 3.7 — AB (ref 0.6–3.4)
MCH, POC: 27.9 pg (ref 27–31.2)
MCHC: 32.8 g/dL (ref 31.8–35.4)
MCV: 85 fL (ref 80–97)
MID (CBC): 0.3 (ref 0–0.9)
MPV: 8.2 fL (ref 0–99.8)
POC GRANULOCYTE: 5.2 (ref 2–6.9)
POC LYMPH %: 40.3 % (ref 10–50)
POC MID %: 3.4 % (ref 0–12)
Platelet Count, POC: 307 10*3/uL (ref 142–424)
RBC: 5.6 M/uL (ref 4.69–6.13)
RDW, POC: 13.6 %
WBC: 9.3 10*3/uL (ref 4.6–10.2)

## 2017-12-07 LAB — POC MICROSCOPIC URINALYSIS (UMFC): MUCUS RE: ABSENT

## 2017-12-07 NOTE — Patient Instructions (Addendum)
Your labs are consistent with dehydration and constipation. We have collected additional labs and should have those results back within a few days. In the meantime, increase your water consumption to at least 64 oz a day. Decrease coke consumption. For constipation   Make sure you are drinking enough water daily. Make sure you are getting enough fiber in your diet - this will make you regular - you can eat high fiber foods or use metamucil as a supplement - it is really important to drink enough water when using fiber supplements.  If your stools are hard or are formed balls or you have to strain a stool softener will help - use colace 2-3 capsule a day  For gentle treatment of constipation Use Miralax 1-2 capfuls a day until your stools are soft and regular and then decrease the usage - you can use this daily  For more aggressive treatment of constipation Use 4 capfuls of Colace and 6 doses of Miralax and drink it in 2 hours - this should result in several watery stools - if it does not repeat the next day and then go to daily miralax for a week to make sure your bowels are clean and retrained to work properly  For the most aggressive treatment of constipation Use 14 capfuls of Miralax in 1 gallon of fluid (gatoraid or water work well or a combination of the two) and drink over 12h - it is ok to eat during this time and then use Miralax 1 capful daily for about 2 weeks to prevent the constipation from returning   If you have lab work done today you will be contacted with your lab results within the next 2 weeks.  If you have not heard from [place] then please contact us. The fastest way to get your results is to register for My Chart.   Constipation, Adult Constipation is when a person:  Poops (has a bowel movement) fewer times in a week than normal.  Has a hard time pooping.  Has poop that is dry, hard, or bigger than normal.  Follow these instructions at home: Eating and drinking   Eat  foods that have a lot of fiber, such as: ? Fresh fruits and vegetables. ? Whole grains. ? Beans.  Eat less of foods that are high in fat, low in fiber, or overly processed, such as: ? [place] fries. ? Hamburgers. ? Cookies. ? Candy. ? Soda.  Drink enough fluid to keep your pee (urine) clear or pale yellow. General instructions  Exercise regularly or as told by your doctor.  Go to the restroom when you feel like you need to poop. Do not hold it in.  Take over-the-counter and prescription medicines only as told by your doctor. These include any fiber supplements.  Do pelvic floor retraining exercises, such as: ? Doing deep breathing while relaxing your lower belly (abdomen). ? Relaxing your pelvic floor while pooping.  Watch your condition for any changes.  Keep all follow-up visits as told by your doctor. This is important. Contact a doctor if:  You have pain that gets worse.  You have a fever.  You have not pooped for 4 days.  You throw up (vomit).  You are not hungry.  You lose weight.  You are bleeding from the anus.  You have thin, pencil-like poop (stool). Get help right away if:  You have a fever, and your symptoms suddenly get worse.  You leak poop or have blood in your poop.  Your belly feels hard or bigger than normal (is bloated).  You have very bad belly pain.  You feel dizzy or you faint. This information is not intended to replace advice given to you by your health care provider. Make sure you discuss any questions you have with your health care provider. Document Released: 09/09/2007 Document Revised: 10/11/2015 Document Reviewed: 09/11/2015 Elsevier Interactive Patient Education  2018 [employer]  Dehydration, Adult Dehydration is when there is not enough fluid or water in your body. This happens when you lose more fluids than you take in. Dehydration can range from mild to very bad. It should be treated right away to keep it from getting  very bad. Symptoms of mild dehydration may include:  Thirst.  Dry lips.  Slightly dry mouth.  Dry, warm skin.  Dizziness. Symptoms of moderate dehydration may include:  Very dry mouth.  Muscle cramps.  Dark pee (urine). Pee may be the color of tea.  Your body making less pee.  Your eyes making fewer tears.  Heartbeat that is uneven or faster than normal (palpitations).  Headache.  Light-headedness, especially when you stand up from sitting.  Fainting (syncope). Symptoms of very bad dehydration may include:  Changes in skin, such as: ? Cold and clammy skin. ? Blotchy (mottled) or pale skin. ? Skin that does not quickly return to normal after being lightly pinched and let go (poor skin turgor).  Changes in body fluids, such as: ? Feeling very thirsty. ? Your eyes making fewer tears. ? Not sweating when body temperature is high, such as in hot weather. ? Your body making very little pee.  Changes in vital signs, such as: ? Weak pulse. ? Pulse that is more than 100 beats a minute when you are sitting still. ? Fast breathing. ? Low blood pressure.  Other changes, such as: ? Sunken eyes. ? Cold hands and feet. ? Confusion. ? Lack of energy (lethargy). ? Trouble waking up from sleep. ? Short-term weight loss. ? Unconsciousness. Follow these instructions at home:  If told by your doctor, drink an ORS: ? Make an ORS by using instructions on the package. ? Start by drinking small amounts, about  cup (120 mL) every 5-10 minutes. ? Slowly drink more until you have had the amount that your doctor said to have.  Drink enough clear fluid to keep your pee clear or pale yellow. If you were told to drink an ORS, finish the ORS first, then start slowly drinking clear fluids. Drink fluids such as: ? Water. Do not drink only water by itself. Doing that can make the salt (sodium) level in your body get too low (hyponatremia). ? Ice chips. ? Fruit juice that you have  added water to (diluted). ? Low-calorie sports drinks.  Avoid: ? Alcohol. ? Drinks that have a lot of sugar. These include high-calorie sports drinks, fruit juice that does not have water added, and soda. ? Caffeine. ? Foods that are greasy or have a lot of fat or sugar.  Take over-the-counter and prescription medicines only as told by your doctor.  Do not take salt tablets. Doing that can make the salt level in your body get too high (hypernatremia).  Eat foods that have minerals (electrolytes). Examples include bananas, oranges, potatoes, tomatoes, and spinach.  Keep all follow-up visits as told by your doctor. This is important. Contact a doctor if:  You have belly (abdominal) pain that: ? Gets worse. ? Stays in one area (localizes).  You have  a rash.  You have a stiff neck.  You get angry or annoyed more easily than normal (irritability).  You are more sleepy than normal.  You have a harder time waking up than normal.  You feel: ? Weak. ? Dizzy. ? Very thirsty.  You have peed (urinated) only a small amount of very dark pee during 6-8 hours. Get help right away if:  You have symptoms of very bad dehydration.  You cannot drink fluids without throwing up (vomiting).  Your symptoms get worse with treatment.  You have a fever.  You have a very bad headache.  You are throwing up or having watery poop (diarrhea) and it: ? Gets worse. ? Does not go away.  You have blood or something green (bile) in your throw-up.  You have blood in your poop (stool). This may cause poop to look black and tarry.  You have not peed in 6-8 hours.  You pass out (faint).  Your heart rate when you are sitting still is more than 100 beats a minute.  You have trouble breathing. This information is not intended to replace advice given to you by your health care provider. Make sure you discuss any questions you have with your health care provider. Document Released: 01/17/2009  Document Revised: 10/11/2015 Document Reviewed: 05/17/2015 Elsevier Interactive Patient Education  2018 [employer].  IF you received an x-ray today, you will receive an invoice from [facility]. Please contact [facility] at [phone] with questions or concerns regarding your invoice.   IF you received labwork today, you will receive an invoice from [place]. Please contact [facility] at [phone] with questions or concerns regarding your invoice.   Our billing staff will not be able to assist you with questions regarding bills from these companies.  You will be contacted with the lab results as soon as they are available. The fastest way to get your results is to activate your My Chart account. Instructions are located on the last page of this paperwork. If you have not heard from [place] regarding the results in 2 weeks, please contact this office.

## 2017-12-08 LAB — CMP14+EGFR
ALBUMIN: 4.9 g/dL (ref 3.5–5.5)
ALK PHOS: 121 IU/L — AB (ref 39–117)
ALT: 26 IU/L (ref 0–44)
AST: 27 IU/L (ref 0–40)
Albumin/Globulin Ratio: 1.3 (ref 1.2–2.2)
BUN / CREAT RATIO: 14 (ref 9–20)
BUN: 12 mg/dL (ref 6–24)
Bilirubin Total: 1 mg/dL (ref 0.0–1.2)
CHLORIDE: 100 mmol/L (ref 96–106)
CO2: 21 mmol/L (ref 20–29)
CREATININE: 0.87 mg/dL (ref 0.76–1.27)
Calcium: 9.9 mg/dL (ref 8.7–10.2)
GFR calc Af Amer: 116 mL/min/{1.73_m2} (ref >59)
GFR calc non Af Amer: 100 mL/min/{1.73_m2} (ref >59)
GLOBULIN, TOTAL: 3.8 g/dL (ref 1.5–4.5)
GLUCOSE: 90 mg/dL (ref 65–99)
Potassium: 4.3 mmol/L (ref 3.5–5.2)
SODIUM: 140 mmol/L (ref 134–144)
Total Protein: 8.7 g/dL — ABNORMAL HIGH (ref 6.0–8.5)

## 2017-12-08 LAB — RPR: RPR Ser Ql: NONREACTIVE

## 2017-12-08 LAB — HEPATITIS PANEL, ACUTE
HEP A IGM: NEGATIVE
HEP B S AG: NEGATIVE
Hep B C IgM: NEGATIVE
Hep C Virus Ab: <0.1 s/co ratio (ref 0.0–0.9)

## 2017-12-08 LAB — HIV ANTIBODY (ROUTINE TESTING W REFLEX): HIV SCREEN 4TH GENERATION: NONREACTIVE

## 2017-12-09 LAB — GC/CHLAMYDIA PROBE AMP
CHLAMYDIA, DNA PROBE: NEGATIVE
NEISSERIA GONORRHOEAE BY PCR: NEGATIVE

## 2017-12-09 LAB — TRICHOMONAS VAGINALIS, PROBE AMP: Trich vag by NAA: NEGATIVE

## 2017-12-13 ENCOUNTER — Ambulatory Visit: Payer: BLUE CROSS/BLUE SHIELD | Admitting: Family Medicine

## 2017-12-21 ENCOUNTER — Ambulatory Visit (INDEPENDENT_AMBULATORY_CARE_PROVIDER_SITE_OTHER): Payer: BLUE CROSS/BLUE SHIELD | Admitting: Physician Assistant

## 2017-12-21 ENCOUNTER — Encounter: Payer: Self-pay | Admitting: Physician Assistant

## 2017-12-21 ENCOUNTER — Other Ambulatory Visit: Payer: Self-pay

## 2017-12-21 VITALS — BP 124/82 | HR 84 | Temp 98.7°F | Resp 18 | Ht 67.72 in | Wt 177.2 lb

## 2017-12-21 DIAGNOSIS — Z1389 Encounter for screening for other disorder: Secondary | ICD-10-CM

## 2017-12-21 DIAGNOSIS — Z1329 Encounter for screening for other suspected endocrine disorder: Secondary | ICD-10-CM

## 2017-12-21 DIAGNOSIS — Z13228 Encounter for screening for other metabolic disorders: Secondary | ICD-10-CM

## 2017-12-21 DIAGNOSIS — Z23 Encounter for immunization: Secondary | ICD-10-CM | POA: Diagnosis not present

## 2017-12-21 DIAGNOSIS — Z130 Encounter for screening for diseases of the blood and blood-forming organs and certain disorders involving the immune mechanism: Secondary | ICD-10-CM | POA: Diagnosis not present

## 2017-12-21 DIAGNOSIS — Z125 Encounter for screening for malignant neoplasm of prostate: Secondary | ICD-10-CM

## 2017-12-21 DIAGNOSIS — Z13220 Encounter for screening for lipoid disorders: Secondary | ICD-10-CM

## 2017-12-21 DIAGNOSIS — Z1211 Encounter for screening for malignant neoplasm of colon: Secondary | ICD-10-CM

## 2017-12-21 DIAGNOSIS — Z0000 Encounter for general adult medical examination without abnormal findings: Secondary | ICD-10-CM | POA: Diagnosis not present

## 2017-12-21 NOTE — Patient Instructions (Addendum)

## 2017-12-21 NOTE — Progress Notes (Signed)
Juan Tanner  MRN: [id number] DOB: Dec 02, 1965  Subjective:  Pt is a 52 y.o. male who presents for annual physical exam. Pt is fasting.   Diet: Rice, vegetables, and lamb. Drinks water and soda. Gets fair amount of calcium. No multivitamin.  Exercise: No structured exercise.  Sleep:5-7 hours BM: Daily Urinary issues: Denies urinary frequency, urgency, weak urinary stream, dribbling, nocturia, and ED. No FH of prostate cancer that he is aware of.   Last dental exam: A while ago, brushes BID Last vision exam: Annually, no Rx eyeglasses. Last colonoscopy: Never   Vaccinations      Tetanus: 2016      Influenza: Last year  Patient Active Problem List   Diagnosis Date Noted  . Premature ventricular contractions 06/06/2012  . GERD 02/16/2008  . ABDOMINAL PAIN, LEFT UPPER QUADRANT 02/16/2008  . BLOOD IN STOOL, OCCULT 02/16/2008    Current Outpatient Medications on File Prior to Visit  Medication Sig Dispense Refill  . pantoprazole (PROTONIX) 40 MG tablet TAKE 1 TABLET BY MOUTH DAILY. TAKE 30 MINUTES BEFORE BREAKFAST. DO NOT MISS DOSES 42 tablet 0   No current facility-administered medications on file prior to visit.     No Known Allergies  Social History   Socioeconomic History  . Marital status: Married    Spouse name: Not on file  . Number of children: 5  . Years of education: Not on file  . Highest education level: Not on file  Occupational History  . Not on file  Social Needs  . Financial resource strain: Not hard at all  . Food insecurity:    Worry: Never true    Inability: Never true  . Transportation needs:    Medical: No    Non-medical: No  Tobacco Use  . Smoking status: Never Smoker  . Smokeless tobacco: Never Used  Substance and Sexual Activity  . Alcohol use: No  . Drug use: No  . Sexual activity: Yes    Partners: Female  Lifestyle  . Physical activity:    Days per week: 0 days    Minutes per session: 0 min  . Stress: Not at all  Relationships   . Social connections:    Talks on phone: Once a week    Gets together: Once a week    Attends religious service: More than 4 times per year    Active member of club or organization: Yes    Attends meetings of clubs or organizations: More than 4 times per year    Relationship status: Married  Other Topics Concern  . Not on file  Social History Narrative   Pt is from [place], moved here in 1998.    Lives in [place] with spouse and children.   Works in [employer]    Past Surgical History:  Procedure Laterality Date  . none      Family History  Problem Relation Age of Onset  . Heart attack Neg Hx     Review of Systems  Constitutional: Negative for activity change, appetite change, chills, diaphoresis, fatigue, fever and unexpected weight change.  HENT: Negative for congestion, dental problem, drooling, ear discharge, ear pain, facial swelling, hearing loss, mouth sores, nosebleeds, postnasal drip, rhinorrhea, sinus pressure, sinus pain, sneezing, sore throat, tinnitus, trouble swallowing and voice change.   Eyes: Negative for photophobia, pain, discharge, redness, itching and visual disturbance.  Respiratory: Negative for apnea, cough, choking, chest tightness, shortness of breath, wheezing and stridor.   Cardiovascular: Negative for chest  pain, palpitations and leg swelling.  Gastrointestinal: Negative for abdominal distention, abdominal pain, anal bleeding, blood in stool, constipation, diarrhea, nausea, rectal pain and vomiting.  Endocrine: Negative for cold intolerance, heat intolerance, polydipsia, polyphagia and polyuria.  Genitourinary: Negative for decreased urine volume, difficulty urinating, discharge, dysuria, enuresis, flank pain, frequency, genital sores, hematuria, penile pain, penile swelling, scrotal swelling, testicular pain and urgency.  Musculoskeletal: Negative for arthralgias, back pain, gait problem, joint swelling, myalgias, neck pain and neck  stiffness.  Skin: Negative for color change, pallor, rash and wound.  Allergic/Immunologic: Negative for environmental allergies, food allergies and immunocompromised state.  Neurological: Negative for dizziness, tremors, seizures, syncope, facial asymmetry, speech difficulty, weakness, light-headedness, numbness and headaches.  Hematological: Negative for adenopathy. Does not bruise/bleed easily.  Psychiatric/Behavioral: Negative for agitation, behavioral problems, confusion, decreased concentration, dysphoric mood, hallucinations, self-injury, sleep disturbance and suicidal ideas. The patient is not nervous/anxious and is not hyperactive.     Objective:  BP 124/82   Pulse 84   Temp 98.7 F (37.1 C) (Oral)   Resp 18   Ht 5' 7.72" (1.72 m)   Wt 177 lb 3.2 oz (80.4 kg)   SpO2 97%   BMI 27.17 kg/m   Physical Exam  Constitutional: He is oriented to person, place, and time. He appears well-developed and well-nourished. No distress.  HENT:  Head: Normocephalic and atraumatic.  Right Ear: Hearing, tympanic membrane, external ear and ear canal normal.  Left Ear: Hearing, tympanic membrane, external ear and ear canal normal.  Nose: Nose normal.  Mouth/Throat: Uvula is midline, oropharynx is clear and moist and mucous membranes are normal. No oropharyngeal exudate.  Eyes: Pupils are equal, round, and reactive to light. Conjunctivae and EOM are normal.  Neck: Trachea normal and normal range of motion.  Cardiovascular: Normal rate, regular rhythm, normal heart sounds and intact distal pulses.  Pulmonary/Chest: Effort normal and breath sounds normal.  Abdominal: Soft. Normal appearance and bowel sounds are normal. There is no tenderness.  Genitourinary:  Genitourinary Comments: Deferred.  Musculoskeletal: Normal range of motion.  Lymphadenopathy:       Head (right side): No submental, no submandibular, no tonsillar, no preauricular, no posterior auricular and no occipital adenopathy present.         Head (left side): No submental, no submandibular, no tonsillar, no preauricular, no posterior auricular and no occipital adenopathy present.    He has no cervical adenopathy.       Right: No supraclavicular adenopathy present.       Left: No supraclavicular adenopathy present.  Neurological: He is alert and oriented to person, place, and time. He has normal strength and normal reflexes.  Skin: Skin is warm and dry.  Vitals reviewed.   Visual Acuity Screening   Right eye Left eye Both eyes  Without correction: _0  With correction:       Assessment and Plan :  Discussed healthy lifestyle, diet, exercise, preventative care, vaccinations, and addressed patient's concerns. Plan for follow up in one year. Otherwise, plan for specific conditions below.  1. Annual physical exam Await lab results.   2. Screening for deficiency anemia - CBC with Differential/Platelet  3. Screening cholesterol level - Lipid panel  4. Screening for prostate cancer - PSA  5. Screening for thyroid disorder - TSH  6. Screening for hematuria or proteinuria - Urinalysis, dipstick only  7. Screen for colon cancer Declines at this time. Pt would like to hold off on colon cancer right now as he  reports he does not think his biological age is 81 but is maybe a few years younger. Reports that when he was born in [place], they did not document the year. His birth year of 40 was a guess. He has been told by his father that he is probably at least 5-6 years younger. Discussed with pt that we can revisit this topic next year.   8. Screening for metabolic disorder - TGA89+KSMM  9. Needs flu shot - Flu Vaccine QUAD 36+ mos IM  Tenna Delaine, PA-C  [facility] 12/21/2017 3:19 PM

## 2017-12-22 LAB — CBC WITH DIFFERENTIAL/PLATELET
Basophils Absolute: 0 10*3/uL (ref 0.0–0.2)
Basos: 0 %
EOS (ABSOLUTE): 0.1 10*3/uL (ref 0.0–0.4)
Eos: 1 %
Hematocrit: 43.1 % (ref 37.5–51.0)
Hemoglobin: 14.7 g/dL (ref 13.0–17.7)
IMMATURE GRANULOCYTES: 0 %
Immature Grans (Abs): 0 10*3/uL (ref 0.0–0.1)
Lymphocytes Absolute: 3.1 10*3/uL (ref 0.7–3.1)
Lymphs: 42 %
MCH: 28.1 pg (ref 26.6–33.0)
MCHC: 34.1 g/dL (ref 31.5–35.7)
MCV: 82 fL (ref 79–97)
MONOS ABS: 0.5 10*3/uL (ref 0.1–0.9)
Monocytes: 7 %
NEUTROS PCT: 50 %
Neutrophils Absolute: 3.7 10*3/uL (ref 1.4–7.0)
PLATELETS: 296 10*3/uL (ref 150–450)
RBC: 5.23 x10E6/uL (ref 4.14–5.80)
RDW: 14.4 % (ref 12.3–15.4)
WBC: 7.4 10*3/uL (ref 3.4–10.8)

## 2017-12-22 LAB — CMP14+EGFR
ALT: 21 IU/L (ref 0–44)
AST: 20 IU/L (ref 0–40)
Albumin/Globulin Ratio: 1.3 (ref 1.2–2.2)
Albumin: 4.7 g/dL (ref 3.5–5.5)
Alkaline Phosphatase: 106 IU/L (ref 39–117)
BUN/Creatinine Ratio: 16 (ref 9–20)
BUN: 14 mg/dL (ref 6–24)
Bilirubin Total: 1 mg/dL (ref 0.0–1.2)
CALCIUM: 9.6 mg/dL (ref 8.7–10.2)
CO2: 23 mmol/L (ref 20–29)
CREATININE: 0.86 mg/dL (ref 0.76–1.27)
Chloride: 102 mmol/L (ref 96–106)
GFR calc Af Amer: 116 mL/min/{1.73_m2} (ref >59)
GFR, EST NON AFRICAN AMERICAN: 100 mL/min/{1.73_m2} (ref >59)
Globulin, Total: 3.5 g/dL (ref 1.5–4.5)
Glucose: 82 mg/dL (ref 65–99)
POTASSIUM: 4.2 mmol/L (ref 3.5–5.2)
Sodium: 140 mmol/L (ref 134–144)
Total Protein: 8.2 g/dL (ref 6.0–8.5)

## 2017-12-22 LAB — LIPID PANEL
Chol/HDL Ratio: 5.1 ratio — ABNORMAL HIGH (ref 0.0–5.0)
Cholesterol, Total: 223 mg/dL — ABNORMAL HIGH (ref 100–199)
HDL: 44 mg/dL (ref >39)
LDL Calculated: 149 mg/dL — ABNORMAL HIGH (ref 0–99)
Triglycerides: 152 mg/dL — ABNORMAL HIGH (ref 0–149)
VLDL Cholesterol Cal: 30 mg/dL (ref 5–40)

## 2017-12-22 LAB — URINALYSIS, DIPSTICK ONLY
Bilirubin, UA: NEGATIVE
Glucose, UA: NEGATIVE
Ketones, UA: NEGATIVE
Leukocytes, UA: NEGATIVE
Nitrite, UA: NEGATIVE
Protein, UA: NEGATIVE
RBC, UA: NEGATIVE
Specific Gravity, UA: 1.009 (ref 1.005–1.030)
Urobilinogen, Ur: 0.2 mg/dL (ref 0.2–1.0)
pH, UA: 6 (ref 5.0–7.5)

## 2017-12-22 LAB — PSA: PROSTATE SPECIFIC AG, SERUM: 0.4 ng/mL (ref 0.0–4.0)

## 2017-12-22 LAB — TSH: TSH: 0.583 u[IU]/mL (ref 0.450–4.500)

## 2017-12-23 ENCOUNTER — Encounter: Payer: Self-pay | Admitting: *Deleted

## 2018-07-17 ENCOUNTER — Emergency Department (HOSPITAL_COMMUNITY)
Admission: EM | Admit: 2018-07-17 | Discharge: 2018-07-17 | Disposition: A | Discharge status: Home / Self Care | Payer: BLUE CROSS/BLUE SHIELD | Attending: Emergency Medicine | Admitting: Emergency Medicine

## 2018-07-17 ENCOUNTER — Emergency Department (HOSPITAL_COMMUNITY): Payer: BLUE CROSS/BLUE SHIELD

## 2018-07-17 ENCOUNTER — Other Ambulatory Visit: Payer: Self-pay

## 2018-07-17 ENCOUNTER — Encounter (HOSPITAL_COMMUNITY): Payer: Self-pay

## 2018-07-17 DIAGNOSIS — R1030 Lower abdominal pain, unspecified: Secondary | ICD-10-CM | POA: Diagnosis not present

## 2018-07-17 DIAGNOSIS — R079 Chest pain, unspecified: Secondary | ICD-10-CM | POA: Diagnosis not present

## 2018-07-17 DIAGNOSIS — R000 Tachycardia, unspecified: Secondary | ICD-10-CM | POA: Diagnosis not present

## 2018-07-17 DIAGNOSIS — R0789 Other chest pain: Secondary | ICD-10-CM | POA: Diagnosis not present

## 2018-07-17 DIAGNOSIS — K219 Gastro-esophageal reflux disease without esophagitis: Secondary | ICD-10-CM

## 2018-07-17 DIAGNOSIS — R110 Nausea: Secondary | ICD-10-CM | POA: Diagnosis not present

## 2018-07-17 LAB — CBC
HCT: 46.3 % (ref 39.0–52.0)
Hemoglobin: 14.8 g/dL (ref 13.0–17.0)
MCH: 27.1 pg (ref 26.0–34.0)
MCHC: 32 g/dL (ref 30.0–36.0)
MCV: 84.6 fL (ref 80.0–100.0)
Platelets: 313 10*3/uL (ref 150–400)
RBC: 5.47 MIL/uL (ref 4.22–5.81)
RDW: 13.1 % (ref 11.5–15.5)
WBC: 9.2 10*3/uL (ref 4.0–10.5)
nRBC: 0 % (ref 0.0–0.2)

## 2018-07-17 LAB — BASIC METABOLIC PANEL
Anion gap: 10 (ref 5–15)
BUN: 8 mg/dL (ref 6–20)
CO2: 23 mmol/L (ref 22–32)
Calcium: 8.9 mg/dL (ref 8.9–10.3)
Chloride: 104 mmol/L (ref 98–111)
Creatinine, Ser: 0.99 mg/dL (ref 0.61–1.24)
GFR calc Af Amer: >60 mL/min (ref >60)
GFR calc non Af Amer: >60 mL/min (ref >60)
Glucose, Bld: 174 mg/dL — ABNORMAL HIGH (ref 70–99)
Potassium: 3 mmol/L — ABNORMAL LOW (ref 3.5–5.1)
Sodium: 137 mmol/L (ref 135–145)

## 2018-07-17 LAB — HEPATIC FUNCTION PANEL
ALT: 38 U/L (ref 0–44)
AST: 56 U/L — ABNORMAL HIGH (ref 15–41)
Albumin: 4.3 g/dL (ref 3.5–5.0)
Alkaline Phosphatase: 101 U/L (ref 38–126)
Bilirubin, Direct: 0.3 mg/dL — ABNORMAL HIGH (ref 0.0–0.2)
Indirect Bilirubin: 1.6 mg/dL — ABNORMAL HIGH (ref 0.3–0.9)
Total Bilirubin: 1.9 mg/dL — ABNORMAL HIGH (ref 0.3–1.2)
Total Protein: 8.3 g/dL — ABNORMAL HIGH (ref 6.5–8.1)

## 2018-07-17 LAB — TROPONIN I: Troponin I: <0.03 ng/mL (ref <0.03)

## 2018-07-17 LAB — LIPASE, BLOOD: Lipase: 26 U/L (ref 11–51)

## 2018-07-17 MED ORDER — IOHEXOL 300 MG/ML  SOLN
100.0000 mL | Freq: Once | INTRAMUSCULAR | Status: AC | PRN
  Administered 2018-07-17: 100 mL via INTRAVENOUS

## 2018-07-17 MED ORDER — PANTOPRAZOLE SODIUM 40 MG PO TBEC: DELAYED_RELEASE_TABLET | ORAL | 0 refills | Status: DC

## 2018-07-17 MED ORDER — SODIUM CHLORIDE 0.9% FLUSH: 3.0000 mL | Freq: Once | INTRAVENOUS | Status: DC

## 2018-07-17 NOTE — ED Provider Notes (Signed)
[facility]   CSN: [id number] Arrival date & time: 07/17/18  1947    History   Chief Complaint Chief Complaint  Patient presents with   Chest Pain   Abdominal Pain    HPI Juan Tanner is a 53 y.o. male.     HPI Patient presents with chest pain abdominal pain.  Has had for the last 2 or 3 weeks.  Pain worse after eating.  No fevers but states he may have had some chills..  Pain is in the chest and in the lower abdomen.  No diarrhea or constipation.  Had previously been on Protonix and Nexium but on neither now.  Pain does not come on with exertion.  Sometimes feels worse when he presses on the left side of his abdomen.  Pain is dull. Past Medical History:  Diagnosis Date   Blood in stool    GERD (gastroesophageal reflux disease)    Premature ventricular contraction    outflow tract pvcs    Patient Active Problem List   Diagnosis Date Noted   Premature ventricular contractions 06/06/2012   GERD 02/16/2008    Past Surgical History:  Procedure Laterality Date   none          Home Medications    Prior to Admission medications   Medication Sig Start Date End Date Taking? Authorizing Provider  pantoprazole (PROTONIX) 40 MG tablet TAKE 1 TABLET BY MOUTH DAILY. TAKE 30 MINUTES BEFORE BREAKFAST. DO NOT MISS DOSES 07/17/18   Benjiman CorePickering, Velita Quirk, MD    Family History Family History  Problem Relation Age of Onset   Heart attack Neg Hx     Social History Social History   Tobacco Use   Smoking status: Never Smoker   Smokeless tobacco: Never Used  Substance Use Topics   Alcohol use: No   Drug use: No     Allergies   Patient has no known allergies.   Review of Systems Review of Systems  Constitutional: Positive for chills. Negative for activity change, fatigue and fever.  HENT: Negative for congestion.   Respiratory: Negative for cough and shortness of breath.   Cardiovascular: Positive for  chest pain.  Gastrointestinal: Positive for abdominal pain. Negative for constipation, nausea and vomiting.  Genitourinary: Negative for flank pain.  Musculoskeletal: Negative for back pain.  Neurological: Negative for weakness.  Psychiatric/Behavioral: Negative for confusion.     Physical Exam Updated Vital Signs BP 112/90    Pulse (!) 58    Temp 97.7 F (36.5 C) (Oral)    Resp (!) 21    SpO2 96%   Physical Exam Neck:     Musculoskeletal: Neck supple.  Cardiovascular:     Rate and Rhythm: Normal rate and regular rhythm.  Pulmonary:     Breath sounds: No wheezing, rhonchi or rales.  Abdominal:     Comments: Left mid to lower abdomen tenderness without rebound or guarding.  No hernia palpated.  Musculoskeletal:     Right lower leg: No edema.     Left lower leg: No edema.  Skin:    Capillary Refill: Capillary refill takes less than 2 seconds.  Neurological:     Mental Status: He is alert and oriented to person, place, and time.      ED Treatments / Results  Labs (all labs ordered are listed, but only abnormal results are displayed) Labs Reviewed  BASIC METABOLIC PANEL - Abnormal; Notable for the following components:  Result Value   Potassium 3.0 (*)    Glucose, Bld 174 (*)    All other components within normal limits  HEPATIC FUNCTION PANEL - Abnormal; Notable for the following components:   Total Protein 8.3 (*)    AST 56 (*)    Total Bilirubin 1.9 (*)    Bilirubin, Direct 0.3 (*)    Indirect Bilirubin 1.6 (*)    All other components within normal limits  CBC  TROPONIN I  LIPASE, BLOOD  URINALYSIS, ROUTINE W REFLEX MICROSCOPIC    EKG EKG Interpretation  Date/Time:  Sunday July 17 2018 19:59:19 EDT Ventricular Rate:  105 PR Interval:  172 QRS Duration: 86 QT Interval:  338 QTC Calculation: 446 R Axis:   56 Text Interpretation:  Sinus tachycardia Otherwise normal ECG No significant change since last tracing Confirmed by Benjiman Core [id number]) on  07/17/2018 9:29:26 PM   Radiology Dg Chest 2 View  Result Date: 07/17/2018 CLINICAL DATA:  Chest pain onset 2 weeks ago and nausea. EXAM: CHEST - 2 VIEW COMPARISON:  Chest x-ray dated 04/19/2017. FINDINGS: The heart size and mediastinal contours are within normal limits. Both lungs are clear. The visualized skeletal structures are unremarkable. IMPRESSION: No active cardiopulmonary disease.  No evidence of pneumonia. Electronically Signed   By: Bary Richard M.D.   On: 07/17/2018 20:33   Ct Abdomen Pelvis W Contrast  Result Date: 07/17/2018 CLINICAL DATA:  Abdominal pain with nausea for several weeks EXAM: CT ABDOMEN AND PELVIS WITH CONTRAST TECHNIQUE: Multidetector CT imaging of the abdomen and pelvis was performed using the standard protocol following bolus administration of intravenous contrast. CONTRAST:  OMNIPAQUE 300 COMPARISON:  None. FINDINGS: Lower chest: No acute abnormality. Hepatobiliary: No focal liver abnormality is seen. No gallstones, gallbladder wall thickening, or biliary dilatation. Pancreas: Unremarkable. No pancreatic ductal dilatation or surrounding inflammatory changes. Spleen: Normal in size without focal abnormality. Adrenals/Urinary Tract: Adrenal glands are within normal limits. Kidneys are well visualized. No renal calculi or obstructive changes are seen. Normal excretion of contrast is noted. The bladder is partially distended. Stomach/Bowel: The appendix is well visualized and air filled. No inflammatory changes are seen. The large and small bowel are without obstructive or inflammatory change. The stomach is within normal limits. Vascular/Lymphatic: No significant vascular findings are present. No enlarged abdominal or pelvic lymph nodes. Reproductive: Prostate is unremarkable. Other: No abdominal wall hernia or abnormality. No abdominopelvic ascites. Musculoskeletal: No acute or significant osseous findings. IMPRESSION: No acute abnormality noted. Electronically Signed    By: Alcide Clever M.D.   On: 07/17/2018 22:38    Procedures Procedures (including critical care time)  Medications Ordered in ED Medications  sodium chloride flush (NS) 0.9 % injection 3 mL (has no administration in time range)  iohexol (OMNIPAQUE) 300 MG/ML solution 100 mL (100 mLs Intravenous Contrast Given 07/17/18 2219)     Initial Impression / Assessment and Plan / ED Course  I have reviewed the triage vital signs and the nursing notes.  Pertinent labs & imaging results that were available during my care of the patient were reviewed by me and considered in my medical decision making (see chart for details).       Patient with chest pain abdominal pain.  Chest pain unlikely cardiac.  Does have some left-sided abdominal tenderness.  CT scan done reassuring.  Bilirubin mildly elevated will need to follow as an outpatient.  Will start patient back on his Protonix.  Discharge home with PCP follow-up.  Final Clinical  Impressions(s) / ED Diagnoses   Final diagnoses:  Atypical chest pain  Lower abdominal pain    ED Discharge Orders         Ordered    pantoprazole (PROTONIX) 40 MG tablet     07/17/18 2306           Benjiman Core, MD 07/17/18 2318

## 2018-07-17 NOTE — ED Triage Notes (Signed)
Onset 2 weeks ago chest pain and nausea.  Eating makes worse, laying makes better.  Nose bleed 2 days ago.

## 2018-07-17 NOTE — ED Notes (Signed)
Pt returned from ct

## 2018-07-17 NOTE — ED Triage Notes (Signed)
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

## 2019-01-08 ENCOUNTER — Emergency Department (HOSPITAL_COMMUNITY): Payer: BLUE CROSS/BLUE SHIELD

## 2019-01-08 ENCOUNTER — Other Ambulatory Visit: Payer: Self-pay

## 2019-01-08 ENCOUNTER — Emergency Department (HOSPITAL_COMMUNITY)
Admission: EM | Admit: 2019-01-08 | Discharge: 2019-01-09 | Disposition: A | Discharge status: Home / Self Care | Payer: BLUE CROSS/BLUE SHIELD | Attending: Emergency Medicine | Admitting: Emergency Medicine

## 2019-01-08 ENCOUNTER — Encounter (HOSPITAL_COMMUNITY): Payer: Self-pay | Admitting: Obstetrics and Gynecology

## 2019-01-08 DIAGNOSIS — K5900 Constipation, unspecified: Secondary | ICD-10-CM

## 2019-01-08 DIAGNOSIS — R1031 Right lower quadrant pain: Secondary | ICD-10-CM | POA: Insufficient documentation

## 2019-01-08 NOTE — ED Triage Notes (Addendum)
Pt reports pain in his left armpit and pain in the RLQ of the abdomen. Patient is alert and oriented and in no active distress. Patient reports he "felt something move in his back" a few weeks ago. Patient denies N/V/D

## 2019-01-09 ENCOUNTER — Emergency Department (HOSPITAL_COMMUNITY): Payer: BLUE CROSS/BLUE SHIELD

## 2019-01-09 ENCOUNTER — Encounter (HOSPITAL_COMMUNITY): Payer: Self-pay

## 2019-01-09 LAB — CBC WITH DIFFERENTIAL/PLATELET
Abs Immature Granulocytes: 0.02 10*3/uL (ref 0.00–0.07)
Basophils Absolute: 0.1 10*3/uL (ref 0.0–0.1)
Basophils Relative: 1 %
Eosinophils Absolute: 0.1 10*3/uL (ref 0.0–0.5)
Eosinophils Relative: 2 %
HCT: 43.8 % (ref 39.0–52.0)
Hemoglobin: 14.1 g/dL (ref 13.0–17.0)
Immature Granulocytes: 0 %
Lymphocytes Relative: 44 %
Lymphs Abs: 3.4 10*3/uL (ref 0.7–4.0)
MCH: 27.9 pg (ref 26.0–34.0)
MCHC: 32.2 g/dL (ref 30.0–36.0)
MCV: 86.7 fL (ref 80.0–100.0)
Monocytes Absolute: 0.7 10*3/uL (ref 0.1–1.0)
Monocytes Relative: 10 %
Neutro Abs: 3.2 10*3/uL (ref 1.7–7.7)
Neutrophils Relative %: 43 %
Platelets: 258 10*3/uL (ref 150–400)
RBC: 5.05 MIL/uL (ref 4.22–5.81)
RDW: 13.1 % (ref 11.5–15.5)
WBC: 7.5 10*3/uL (ref 4.0–10.5)
nRBC: 0 % (ref 0.0–0.2)

## 2019-01-09 LAB — I-STAT CHEM 8, ED
BUN: 14 mg/dL (ref 6–20)
Calcium, Ion: 1.25 mmol/L (ref 1.15–1.40)
Chloride: 104 mmol/L (ref 98–111)
Creatinine, Ser: 0.8 mg/dL (ref 0.61–1.24)
Glucose, Bld: 83 mg/dL (ref 70–99)
HCT: 44 % (ref 39.0–52.0)
Hemoglobin: 15 g/dL (ref 13.0–17.0)
Potassium: 3.7 mmol/L (ref 3.5–5.1)
Sodium: 141 mmol/L (ref 135–145)
TCO2: 25 mmol/L (ref 22–32)

## 2019-01-09 LAB — I-STAT BETA HCG BLOOD, ED (MC, WL, AP ONLY): I-stat hCG, quantitative: <5 m[IU]/mL (ref <5)

## 2019-01-09 MED ORDER — LIDOCAINE VISCOUS HCL 2 % MT SOLN
15.0000 mL | Freq: Once | OROMUCOSAL | Status: AC
  Administered 2019-01-09: 15 mL via ORAL
  Filled 2019-01-09: qty 15

## 2019-01-09 MED ORDER — KETOROLAC TROMETHAMINE 30 MG/ML IJ SOLN
15.0000 mg | Freq: Once | INTRAMUSCULAR | Status: AC
  Administered 2019-01-09: 03:00:00 15 mg via INTRAVENOUS
  Filled 2019-01-09: qty 1

## 2019-01-09 MED ORDER — ALUM & MAG HYDROXIDE-SIMETH 200-200-20 MG/5ML PO SUSP
30.0000 mL | Freq: Once | ORAL | Status: AC
  Administered 2019-01-09: 30 mL via ORAL
  Filled 2019-01-09: qty 30

## 2019-01-09 MED ORDER — IOHEXOL 350 MG/ML SOLN
100.0000 mL | Freq: Once | INTRAVENOUS | Status: AC | PRN
  Administered 2019-01-09: 100 mL via INTRAVENOUS

## 2019-01-09 MED ORDER — IBUPROFEN 800 MG PO TABS: 800.0000 mg | ORAL_TABLET | Freq: Three times a day (TID) | ORAL | 0 refills | Status: DC

## 2019-01-09 NOTE — ED Provider Notes (Signed)
[facility]-EMERGENCY DEPT Provider Note   CSN: [id number] Arrival date & time: 01/08/19  2256     History   Chief Complaint Chief Complaint  Patient presents with   Abdominal Pain    HPI Juan Tanner is a 53 y.o. male.     The history is provided by the patient.  Abdominal Pain Pain location:  RUQ and RLQ (moving sensation) Pain quality comment:  "moving" sensation Pain radiates to:  Does not radiate Pain severity:  Moderate Onset quality:  Gradual Timing:  Constant Progression:  Unchanged Chronicity:  New Context: not alcohol use, not awakening from sleep, not diet changes, not eating and not laxative use   Relieved by:  Nothing Worsened by:  Nothing Ineffective treatments:  None tried Associated symptoms: no anorexia, no chills, no diarrhea, no fatigue, no fever, no flatus, no hematuria, no melena, no nausea, no shortness of breath and no vomiting   Associated symptoms comment:  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  Last BM was yesterday.    Past Medical History:  Diagnosis Date   Blood in stool    GERD (gastroesophageal reflux disease)    Premature ventricular contraction    outflow tract pvcs    Patient Active Problem List   Diagnosis Date Noted   Premature ventricular contractions 06/06/2012   GERD 02/16/2008    Past Surgical History:  Procedure Laterality Date   none          Home Medications    Prior to Admission medications   Medication Sig Start Date End Date Taking? Authorizing Provider  acetaminophen (TYLENOL) 500 MG tablet Take 1,000 mg by mouth every 6 (six) hours as needed for mild pain, moderate pain or headache.   Yes [provider]  pantoprazole (PROTONIX) 40 MG tablet TAKE 1 TABLET BY MOUTH DAILY. TAKE 30 MINUTES BEFORE BREAKFAST. DO NOT MISS DOSES Patient not  taking: Reported on 01/09/2019 07/17/18   Benjiman CorePickering, Nathan, MD    Family History Family History  Problem Relation Age of Onset   Heart attack Neg Hx     Social History Social History   Tobacco Use   Smoking status: Never Smoker   Smokeless tobacco: Never Used  Substance Use Topics   Alcohol use: No   Drug use: No     Allergies   Patient has no known allergies.   Review of Systems Review of Systems  Constitutional: Negative for chills, fatigue and fever.  HENT: Negative for congestion.   Eyes: Negative for visual disturbance.  Respiratory: Negative for shortness of breath.   Gastrointestinal: Positive for abdominal pain. Negative for anorexia, diarrhea, flatus, melena, nausea and vomiting.  Genitourinary: Negative for hematuria.  Neurological: Negative for dizziness.  Psychiatric/Behavioral: Negative for agitation.  All other systems reviewed and are negative.    Physical Exam Updated Vital Signs BP 119/81 (BP Location: Left Arm)    Pulse 63    Temp 98.9 F (37.2 C) (Oral)    Resp 15    SpO2 100%   Physical Exam Vitals signs and nursing note reviewed.  Constitutional:      General: He is not in acute distress.    Appearance: He is normal weight.  HENT:     Head: Normocephalic and atraumatic.     Nose: Nose normal.  Eyes:  Conjunctiva/sclera: Conjunctivae normal.     Pupils: Pupils are equal, round, and reactive to light.  Neck:     Musculoskeletal: Normal range of motion and neck supple.  Cardiovascular:     Rate and Rhythm: Normal rate and regular rhythm.     Pulses: Normal pulses.     Heart sounds: Normal heart sounds.  Pulmonary:     Effort: Pulmonary effort is normal.     Breath sounds: Normal breath sounds.  Abdominal:     General: Abdomen is flat.     Tenderness: There is no abdominal tenderness. There is no guarding or rebound.     Hernia: No hernia is present.     Comments: Gas moving through the right colon  Musculoskeletal: Normal  range of motion.  Skin:    General: Skin is warm and dry.     Capillary Refill: Capillary refill takes less than 2 seconds.  Neurological:     General: No focal deficit present.     Mental Status: He is alert.     Deep Tendon Reflexes: Reflexes normal.  Psychiatric:        Mood and Affect: Mood normal.        Behavior: Behavior normal.      ED Treatments / Results  Labs (all labs ordered are listed, but only abnormal results are displayed) Results for orders placed or performed during the hospital encounter of 01/08/19  CBC with Differential/Platelet  Result Value Ref Range   WBC 7.5 4.0 - 10.5 K/uL   RBC 5.05 4.22 - 5.81 MIL/uL   Hemoglobin 14.1 13.0 - 17.0 g/dL   HCT 18.2 99.3 - 71.6 %   MCV 86.7 80.0 - 100.0 fL   MCH 27.9 26.0 - 34.0 pg   MCHC 32.2 30.0 - 36.0 g/dL   RDW 96.7 89.3 - 81.0 %   Platelets 258 150 - 400 K/uL   nRBC 0.0 0.0 - 0.2 %   Neutrophils Relative % 43 %   Neutro Abs 3.2 1.7 - 7.7 K/uL   Lymphocytes Relative 44 %   Lymphs Abs 3.4 0.7 - 4.0 K/uL   Monocytes Relative 10 %   Monocytes Absolute 0.7 0.1 - 1.0 K/uL   Eosinophils Relative 2 %   Eosinophils Absolute 0.1 0.0 - 0.5 K/uL   Basophils Relative 1 %   Basophils Absolute 0.1 0.0 - 0.1 K/uL   Immature Granulocytes 0 %   Abs Immature Granulocytes 0.02 0.00 - 0.07 K/uL  I-stat chem 8, ED (not at [facility] or [facility])  Result Value Ref Range   Sodium 141 135 - 145 mmol/L   Potassium 3.7 3.5 - 5.1 mmol/L   Chloride 104 98 - 111 mmol/L   BUN 14 6 - 20 mg/dL   Creatinine, Ser 1.75 0.61 - 1.24 mg/dL   Glucose, Bld 83 70 - 99 mg/dL   Calcium, Ion 1.02 5.85 - 1.40 mmol/L   TCO2 25 22 - 32 mmol/L   Hemoglobin 15.0 13.0 - 17.0 g/dL   HCT 27.7 82.4 - 23.5 %  I-Stat beta hCG blood, ED ([facility], [facility], [facility] only)  Result Value Ref Range   I-stat hCG, quantitative <5.0 <5 mIU/mL   Comment 3           Ct Angio Chest Pe W And/or Wo Contrast  Result Date: 01/09/2019 CLINICAL DATA:  Left axillary pain EXAM: CT ANGIOGRAPHY  CHEST WITH CONTRAST TECHNIQUE: Multidetector CT imaging of the chest was performed using the standard protocol during bolus  administration of intravenous contrast. Multiplanar CT image reconstructions and MIPs were obtained to evaluate the vascular anatomy. CONTRAST:  OMNIPAQUE IOHEXOL 350 MG/ML SOLN COMPARISON:  01/08/2019 FINDINGS: Cardiovascular: Satisfactory opacification of the pulmonary arteries to the segmental level. No evidence of pulmonary embolism. Mild cardiomegaly. No pericardial effusion. Nonaneurysmal aorta. Mild reflux of contrast into the hepatic veins, possible elevated right heart pressure. Mediastinum/Nodes: No enlarged mediastinal, hilar, or axillary lymph nodes. Thyroid gland, trachea, and esophagus demonstrate no significant findings. Lungs/Pleura: Lungs are clear. No pleural effusion or pneumothorax. Upper Abdomen: No acute abnormality. Musculoskeletal: No chest wall abnormality. No acute or significant osseous findings. Review of the MIP images confirms the above findings. IMPRESSION: 1. No CT evidence for acute pulmonary embolus. 2. Cardiomegaly. Mild reflux of contrast into the hepatic veins suggesting elevated right heart pressure 3. Clear lung fields Electronically Signed   By: Jasmine Pang M.D.   On: 01/09/2019 02:04   Dg Abdomen Acute W/chest  Result Date: 01/08/2019 CLINICAL DATA:  Right lower quadrant abdominal pain EXAM: DG ABDOMEN ACUTE W/ 1V CHEST COMPARISON:  None. FINDINGS: A moderate amount of right colonic stool is present. Air seen down the level the rectum. There is no evidence of dilated bowel loops or free intraperitoneal air. No radiopaque calculi or other significant radiographic abnormality is seen. Heart size and mediastinal contours are within normal limits. Both lungs are clear. IMPRESSION: Negative abdominal radiographs.  No acute cardiopulmonary disease. Electronically Signed   By: Jonna Clark M.D.   On: 01/08/2019 23:54    EKG None  Radiology Ct  Angio Chest Pe W And/or Wo Contrast  Result Date: 01/09/2019 CLINICAL DATA:  Left axillary pain EXAM: CT ANGIOGRAPHY CHEST WITH CONTRAST TECHNIQUE: Multidetector CT imaging of the chest was performed using the standard protocol during bolus administration of intravenous contrast. Multiplanar CT image reconstructions and MIPs were obtained to evaluate the vascular anatomy. CONTRAST:  OMNIPAQUE IOHEXOL 350 MG/ML SOLN COMPARISON:  01/08/2019 FINDINGS: Cardiovascular: Satisfactory opacification of the pulmonary arteries to the segmental level. No evidence of pulmonary embolism. Mild cardiomegaly. No pericardial effusion. Nonaneurysmal aorta. Mild reflux of contrast into the hepatic veins, possible elevated right heart pressure. Mediastinum/Nodes: No enlarged mediastinal, hilar, or axillary lymph nodes. Thyroid gland, trachea, and esophagus demonstrate no significant findings. Lungs/Pleura: Lungs are clear. No pleural effusion or pneumothorax. Upper Abdomen: No acute abnormality. Musculoskeletal: No chest wall abnormality. No acute or significant osseous findings. Review of the MIP images confirms the above findings. IMPRESSION: 1. No CT evidence for acute pulmonary embolus. 2. Cardiomegaly. Mild reflux of contrast into the hepatic veins suggesting elevated right heart pressure 3. Clear lung fields Electronically Signed   By: Jasmine Pang M.D.   On: 01/09/2019 02:04   Dg Abdomen Acute W/chest  Result Date: 01/08/2019 CLINICAL DATA:  Right lower quadrant abdominal pain EXAM: DG ABDOMEN ACUTE W/ 1V CHEST COMPARISON:  None. FINDINGS: A moderate amount of right colonic stool is present. Air seen down the level the rectum. There is no evidence of dilated bowel loops or free intraperitoneal air. No radiopaque calculi or other significant radiographic abnormality is seen. Heart size and mediastinal contours are within normal limits. Both lungs are clear. IMPRESSION: Negative abdominal radiographs.  No acute  cardiopulmonary disease. Electronically Signed   By: Jonna Clark M.D.   On: 01/08/2019 23:54    Procedures Procedures (including critical care time)  Medications Ordered in ED Medications  alum & mag hydroxide-simeth (MAALOX/MYLANTA) 200-200-20 MG/5ML suspension 30 mL (has no  administration in time range)    And  lidocaine (XYLOCAINE) 2 % viscous mouth solution 15 mL (has no administration in time range)  ketorolac (TORADOL) 30 MG/ML injection 15 mg (has no administration in time range)  iohexol (OMNIPAQUE) 350 MG/ML injection 100 mL (100 mLs Intravenous Contrast Given 01/09/19 0128)    Patient has a moderate amount of stool in the right colon with gas.  The moving is likely the gas.  No signs of surgical abdomen.  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 He is well appearing.  Will recommend miralax for stool and ibuprofen for back pain.   Reilley Lagrand was evaluated in Emergency Department on 01/09/2019 for the symptoms described in the history of present illness. He was evaluated in the context of the global COVID-19 pandemic, which necessitated consideration that the patient might be at risk for infection with the SARS-CoV-2 virus that causes COVID-19. Institutional protocols and algorithms that pertain to the evaluation of patients at risk for COVID-19 are in a state of rapid change based on information released by regulatory bodies including the CDC and federal and state organizations. These policies and algorithms were followed during the patient's care in the ED.  Final Clinical Impressions(s) / ED Diagnoses   Return for intractable cough, coughing up blood,fevers >100.4 unrelieved by medication, shortness of breath, intractable vomiting, chest pain, shortness of breath, weakness,numbness, changes in speech, facial asymmetry,abdominal pain, passing out,Inability to tolerate liquids or food, cough, altered mental status or any concerns. No signs of  systemic illness or infection. The patient is nontoxic-appearing on exam and vital signs are within normal limits.   I have reviewed the triage vital signs and the nursing notes. Pertinent labs &imaging results that were available during my care of the patient were reviewed by me and considered in my medical decision making (see chart for details).After history, exam, and medical workup I feel the patient has beenappropriately medically screened and is safe for discharge home. Pertinent diagnoses were discussed with the patient. Patient was given return precautions.   Quinn Quam, MD 01/09/19 [phone]

## 2019-01-16 ENCOUNTER — Encounter: Payer: BLUE CROSS/BLUE SHIELD | Admitting: Registered Nurse

## 2019-03-29 ENCOUNTER — Encounter: Payer: Self-pay | Admitting: Family Medicine

## 2019-03-29 ENCOUNTER — Other Ambulatory Visit: Payer: Self-pay

## 2019-03-29 ENCOUNTER — Ambulatory Visit: Payer: HRSA Program | Attending: Internal Medicine

## 2019-03-29 ENCOUNTER — Ambulatory Visit (INDEPENDENT_AMBULATORY_CARE_PROVIDER_SITE_OTHER): Payer: Self-pay | Admitting: Family Medicine

## 2019-03-29 VITALS — BP 136/85 | HR 72 | Temp 98.0°F | Ht 64.0 in | Wt 188.8 lb

## 2019-03-29 DIAGNOSIS — Z7184 Encounter for health counseling related to travel: Secondary | ICD-10-CM

## 2019-03-29 DIAGNOSIS — Z20828 Contact with and (suspected) exposure to other viral communicable diseases: Secondary | ICD-10-CM | POA: Insufficient documentation

## 2019-03-29 DIAGNOSIS — Z20822 Contact with and (suspected) exposure to covid-19: Secondary | ICD-10-CM

## 2019-03-29 NOTE — Patient Instructions (Addendum)
It is my understanding that most airlines and countries require the test to be done within 72 hours of travel.  Since you are not flying until Sunday afternoon, that would mean getting your test done tomorrow afternoon which becomes a problem with the holiday weekend.  To be on the safe side, I would recommend that you try and get it done tomorrow afternoon or Saturday at a place that can give you same-day results.  It is my understanding that you can get the travel PCR testing done through the [facility].  I recommend that you go there and talk to them.  You can also try going to the [facility] in the [facility] section of town, but I believe they send it out and it takes several days.  Best wishes on your trip.    If you have lab work done today you will be contacted with your lab results within the next 2 weeks.  If you have not heard from [place] then please contact us. The fastest way to get your results is to register for My Chart.   IF you received an x-ray today, you will receive an invoice from [facility]. Please contact [facility] at [phone] with questions or concerns regarding your invoice.   IF you received labwork today, you will receive an invoice from [place]. Please contact [facility] at [phone] with questions or concerns regarding your invoice.   Our billing staff will not be able to assist you with questions regarding bills from these companies.  You will be contacted with the lab results as soon as they are available. The fastest way to get your results is to activate your My Chart account. Instructions are located on the last page of this paperwork. If you have not heard from [place] regarding the results in 2 weeks, please contact this office.

## 2019-03-29 NOTE — Progress Notes (Signed)
Patient ID: Juan Tanner, male    DOB: [birth year]  Age: 53 y.o. MRN: [id number]  Chief Complaint  Patient presents with  . covid test for travel    [place] leaving Sunday 12/27. will be 1-2 months     Subjective:   Patient is traveling to [place] on Sunday arriving on Monday.  He needs a Covid test prior to travel.  Although he does not have the information with him.  This generally requires a PCR test.  I checked and it is uncertain whether we can get the results back before the holiday weekend since Friday is Christmas.  Technically you should also have it drawn tomorrow to be within the 72-hour window.  We discussed this in detail.  I referenced [place] and it sounds like this is what is needed.  We contacted our lab, [facility], one of the [facility] urgent cares, and attempted to reach the [facility] urgent care.  I believe that this last place is the best option for him.  He has no symptoms.  Current allergies, medications, problem list, past/family and social histories reviewed.  Objective:  BP 136/85   Pulse 72   Temp 98 F (36.7 C) (Temporal)   Ht 5\' 4"  (1.626 m)   Wt 188 lb 12.8 oz (85.6 kg)   SpO2 97%   BMI 32.41 kg/m   Healthy and alert.  Not examined today.  Assessment & Plan:   Assessment: 1. Travel advice encounter       Plan: See instructions that include the advice I gave him.  I also wrote out the address for the [facility] urgent care in [place] so he could google it.  No orders of the defined types were placed in this encounter.   No orders of the defined types were placed in this encounter.        Patient Instructions   It is my understanding that most airlines and countries require the test to be done within 72 hours of travel.  Since you are not flying until Sunday afternoon, that would mean getting your test done tomorrow afternoon which becomes a problem with the holiday weekend.  To be on the safe side, I would recommend that  you try and get it done tomorrow afternoon or Saturday at a place that can give you same-day results.  It is my understanding that you can get the travel PCR testing done through [facility].  I recommend that you go there and talk to them.  You can also try going to the [facility] in the [facility] section of town, but I believe they send it out and it takes several days.  Best wishes on your trip.    If you have lab work done today you will be contacted with your lab results within the next 2 weeks.  If you have not heard from [facility] then please contact us. The fastest way to get your results is to register for My Chart.   IF you received an x-ray today, you will receive an invoice from [facility]. Please contact [facility] at [phone] with questions or concerns regarding your invoice.   IF you received labwork today, you will receive an invoice from [place]. Please contact [facility] at [phone] with questions or concerns regarding your invoice.   Our billing staff will not be able to assist you with questions regarding bills from these companies.  You will be contacted with the lab  results as soon as they are available. The fastest way to get your results is to activate your My Chart account. Instructions are located on the last page of this paperwork. If you have not heard from [place] regarding the results in 2 weeks, please contact this office.         Return if symptoms worsen or fail to improve.   Ruben Reason, MD 03/29/2019

## 2019-03-30 ENCOUNTER — Ambulatory Visit: Payer: HRSA Program | Attending: Internal Medicine

## 2019-03-30 DIAGNOSIS — Z20828 Contact with and (suspected) exposure to other viral communicable diseases: Secondary | ICD-10-CM | POA: Insufficient documentation

## 2019-03-30 DIAGNOSIS — Z20822 Contact with and (suspected) exposure to covid-19: Secondary | ICD-10-CM

## 2019-03-31 LAB — NOVEL CORONAVIRUS, NAA: SARS-CoV-2, NAA: NOT DETECTED

## 2019-04-01 ENCOUNTER — Ambulatory Visit: Payer: Self-pay

## 2019-04-01 LAB — NOVEL CORONAVIRUS, NAA: SARS-CoV-2, NAA: NOT DETECTED

## 2019-04-01 NOTE — Telephone Encounter (Signed)
Pt requesting a fax sent to his employer. Pt is active in [place]. Advised pt to look up test results on desk top tablet or laptop and print results. Pt verbalized understanding.  Answer Assessment - Initial Assessment Questions 1. REASON FOR CALL or QUESTION: "What is your reason for calling today?" or "How can I best help you?" or "What question do you have that I can help answer?"     Needing fax.  Protocols used: INFORMATION ONLY CALL-A-AH

## 2020-04-29 IMAGING — CT CT ABDOMEN AND PELVIS WITH CONTRAST
2 of 5 series · 16 of 46 positions shown, 18 images · IV contrast (Omni 300)
Comparison: None.

CLINICAL DATA: Abdominal pain with nausea for several weeks

EXAM:
CT ABDOMEN AND PELVIS WITH CONTRAST
TECHNIQUE: Multidetector CT imaging of the abdomen and pelvis was performed
using the standard protocol following bolus administration of
intravenous contrast.
CONTRAST:  100mL OMNIPAQUE 300

[Series 3: a/p w/ 5mm · axial · 0.81mm/px · z∈[-510,-80]mm · 13 of 96 slices shown, 15 images]
[im 5/96  soft-tissue]
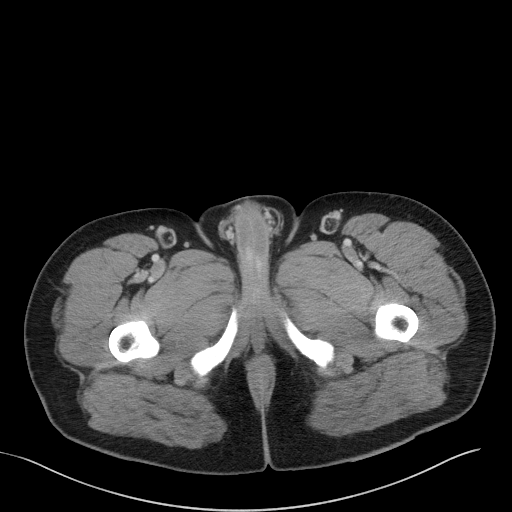
[im 5/96  bone]
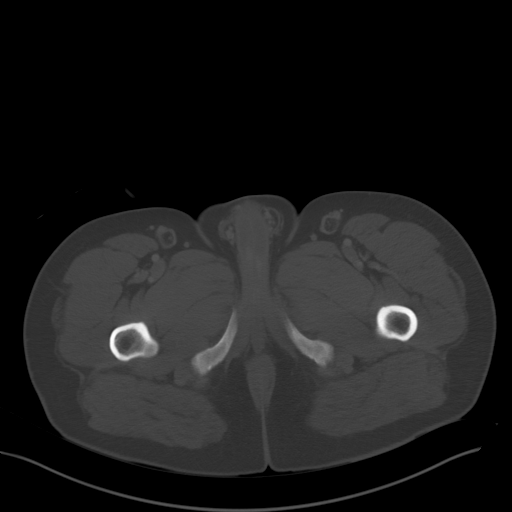
[im 15/96  soft-tissue]
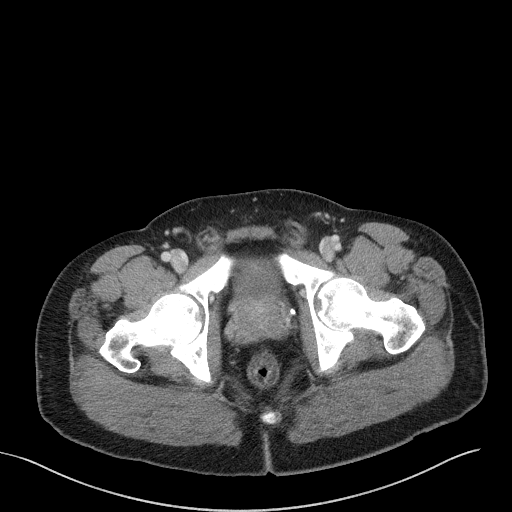
[im 20/96  soft-tissue]
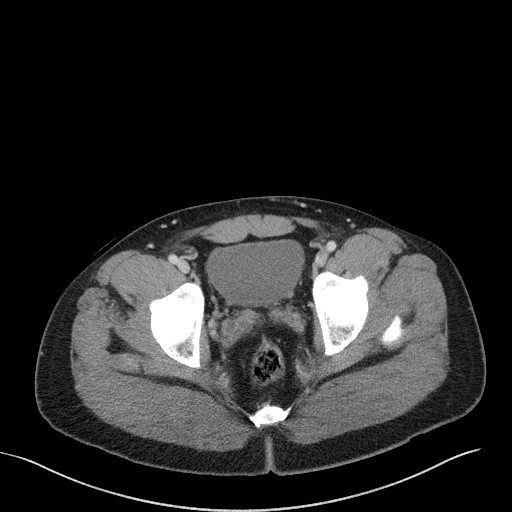
[im 29/96  soft-tissue]
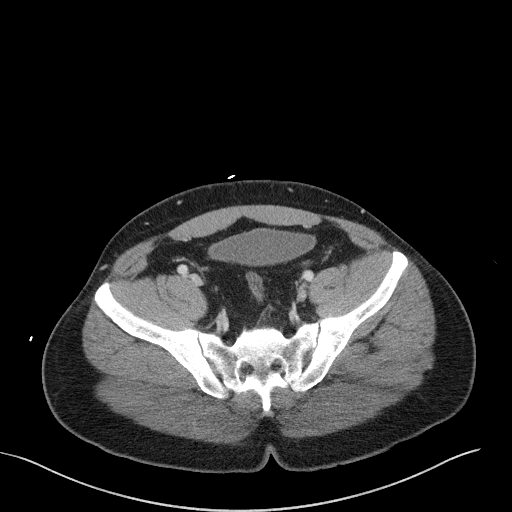
[im 34/96  soft-tissue]
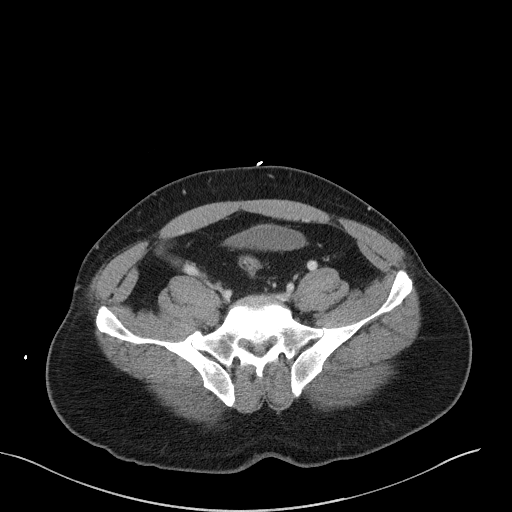
[im 43/96  soft-tissue]
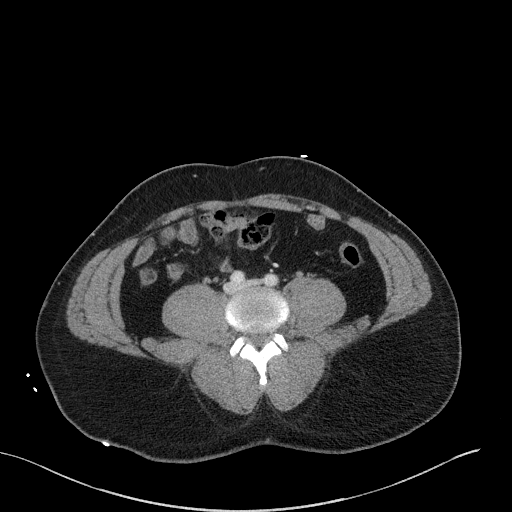
[im 48/96  soft-tissue]
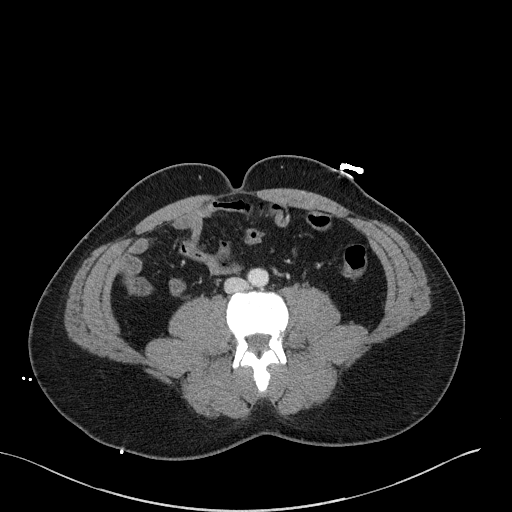
[im 53/96  soft-tissue]
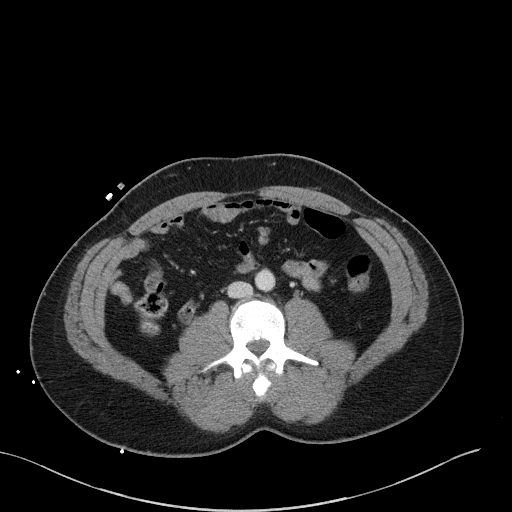
[im 62/96  soft-tissue]
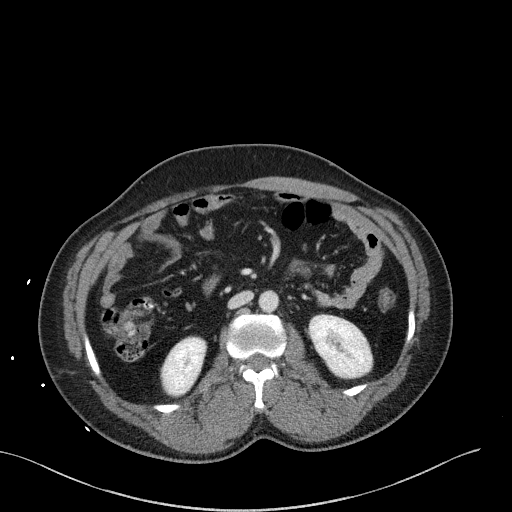
[im 62/96  bone]
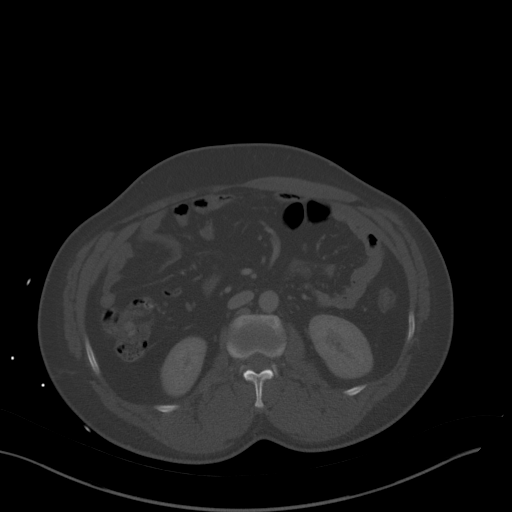
[im 67/96  soft-tissue]
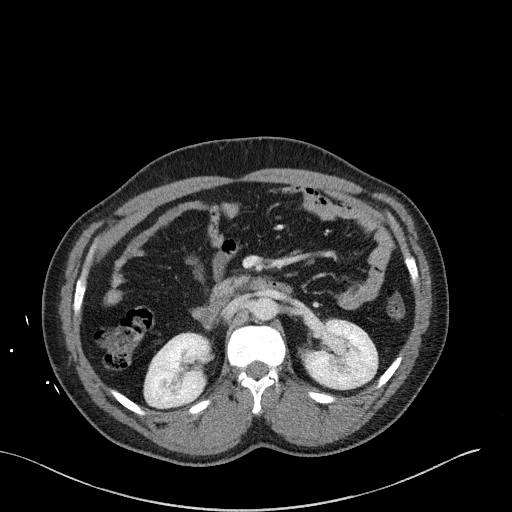
[im 77/96  soft-tissue]
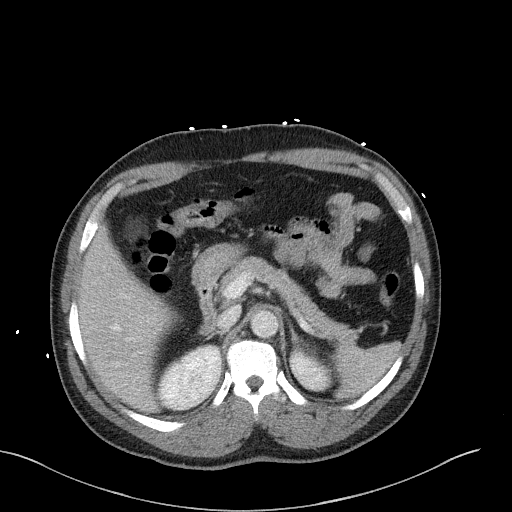
[im 81/96  soft-tissue]
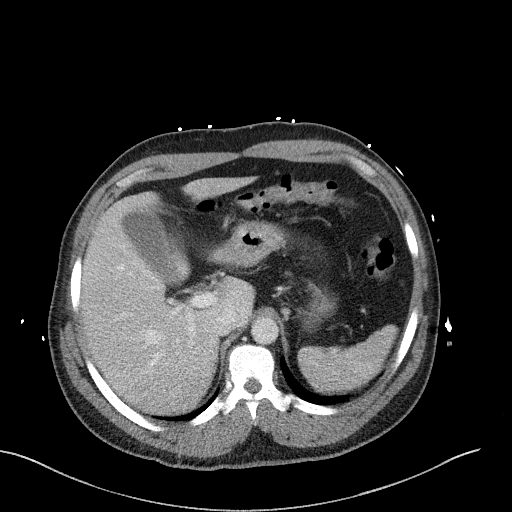
[im 91/96  soft-tissue]
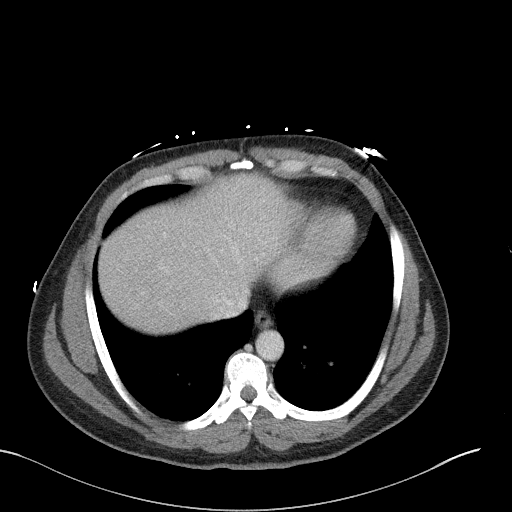

[Series 6: a/p w/ cor · coronal · 0.69mm/px · 3 of 137 slices shown]
[im 46/137  soft-tissue]
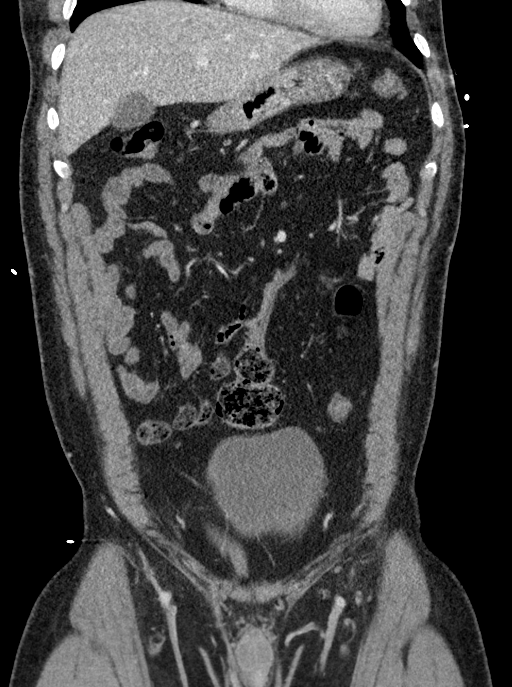
[im 61/137  soft-tissue]
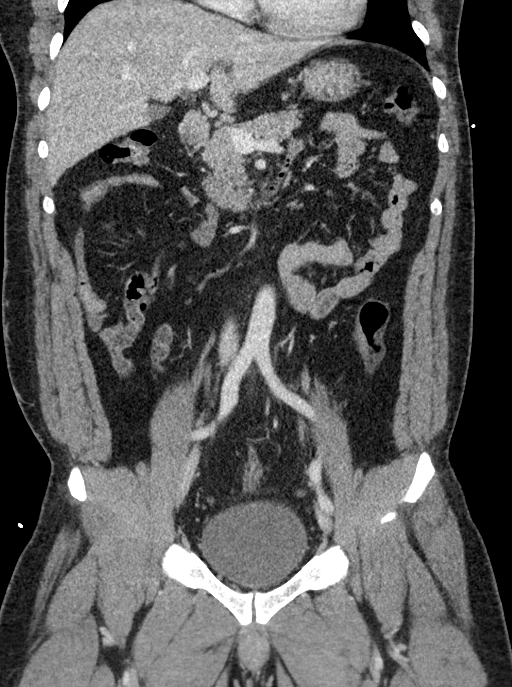
[im 76/137  soft-tissue]
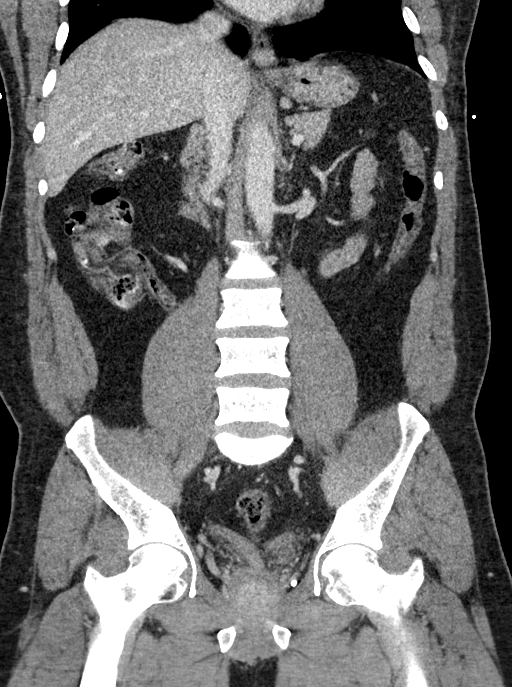

[16 of 46 positions shown; findings below may reference images not displayed]

FINDINGS: Lower chest: No acute abnormality.

Hepatobiliary: No focal liver abnormality is seen. No gallstones,
gallbladder wall thickening, or biliary dilatation.

Pancreas: Unremarkable. No pancreatic ductal dilatation or
surrounding inflammatory changes.

Spleen: Normal in size without focal abnormality.

Adrenals/Urinary Tract: Adrenal glands are within normal limits.
Kidneys are well visualized. No renal calculi or obstructive changes
are seen. Normal excretion of contrast is noted. The bladder is
partially distended.

Stomach/Bowel: The appendix is well visualized and air filled. No
inflammatory changes are seen. The large and small bowel are without
obstructive or inflammatory change. The stomach is within normal
limits.

Vascular/Lymphatic: No significant vascular findings are present. No
enlarged abdominal or pelvic lymph nodes.

Reproductive: Prostate is unremarkable.

Other: No abdominal wall hernia or abnormality. No abdominopelvic
ascites.

Musculoskeletal: No acute or significant osseous findings.
IMPRESSION: No acute abnormality noted.

## 2020-08-23 ENCOUNTER — Emergency Department (HOSPITAL_COMMUNITY)
Admission: EM | Admit: 2020-08-23 | Discharge: 2020-08-24 | Disposition: A | Discharge status: Home / Self Care | Payer: BLUE CROSS/BLUE SHIELD | Attending: Emergency Medicine | Admitting: Emergency Medicine

## 2020-08-23 ENCOUNTER — Other Ambulatory Visit: Payer: Self-pay

## 2020-08-23 ENCOUNTER — Emergency Department (HOSPITAL_COMMUNITY): Payer: BLUE CROSS/BLUE SHIELD

## 2020-08-23 DIAGNOSIS — K219 Gastro-esophageal reflux disease without esophagitis: Secondary | ICD-10-CM | POA: Diagnosis not present

## 2020-08-23 DIAGNOSIS — R079 Chest pain, unspecified: Secondary | ICD-10-CM | POA: Diagnosis not present

## 2020-08-23 DIAGNOSIS — R001 Bradycardia, unspecified: Secondary | ICD-10-CM | POA: Diagnosis not present

## 2020-08-23 NOTE — ED Triage Notes (Signed)
Patient reports L sided chest pain, intermittent x 1 week, denies radiation

## 2020-08-23 NOTE — ED Provider Notes (Signed)
  Emergency Medicine Provider in Triage Note   MSE was initiated and I personally evaluated the patient  11:16 PM on Aug 23, 2020 as provider in triage.   Chief Complaint: chest pain  HPI  Patient is a 55 y.o. who presents to the ED with complaints of chest pain intermittently x 1 week. Left chest, sharp in nature, wraps around to the back at times, lasts a few minutes at a time. No triggers or alleviating/aggravating factors. No pain @ present.    Review of Systems  Positive: Chest pain, back pain Negative: Nausea, vomiting, diaphoresis, syncope, hemoptysis  Physical Exam  BP 134/80 (BP Location: Right Arm)   Pulse 68   Temp 98.1 F (36.7 C) (Oral)   Resp 16   SpO2 97%    Gen:   Awake, no distress   HEENT:  Atraumatic  Resp:  Normal effort  Cardiac:  Normal rate  Abd:   Nondistended, nontender  MSK:   Moves extremities without difficulty  Neuro:  Speech clear   Medical Decision Making   Initiation of care has begun. The patient has been counseled on the process, plan, and necessity for staying for the completion/evaluation, informed that the remainder of the evaluation will be completed by another provider, this initial triage assessment does not replace that evaluation, and the importance of remaining in the ED until their evaluation is complete.  Chest pain work-up initiated.   Clinical Impression  Chest pain        Cherly Anderson, PA-C 08/23/20 2318    Sabas Sous, MD 08/23/20 [phone]

## 2020-08-24 LAB — COMPREHENSIVE METABOLIC PANEL
ALT: 21 U/L (ref 0–44)
AST: 22 U/L (ref 15–41)
Albumin: 3.7 g/dL (ref 3.5–5.0)
Alkaline Phosphatase: 85 U/L (ref 38–126)
Anion gap: 8 (ref 5–15)
BUN: 15 mg/dL (ref 6–20)
CO2: 23 mmol/L (ref 22–32)
Calcium: 8.7 mg/dL — ABNORMAL LOW (ref 8.9–10.3)
Chloride: 104 mmol/L (ref 98–111)
Creatinine, Ser: 1 mg/dL (ref 0.61–1.24)
GFR, Estimated: >60 mL/min (ref >60)
Glucose, Bld: 90 mg/dL (ref 70–99)
Potassium: 4 mmol/L (ref 3.5–5.1)
Sodium: 135 mmol/L (ref 135–145)
Total Bilirubin: 1 mg/dL (ref 0.3–1.2)
Total Protein: 7.4 g/dL (ref 6.5–8.1)

## 2020-08-24 LAB — TROPONIN I (HIGH SENSITIVITY)
Troponin I (High Sensitivity): 4 ng/L (ref <18)
Troponin I (High Sensitivity): 4 ng/L (ref <18)

## 2020-08-24 LAB — CBC
HCT: 41.8 % (ref 39.0–52.0)
Hemoglobin: 13.3 g/dL (ref 13.0–17.0)
MCH: 27.5 pg (ref 26.0–34.0)
MCHC: 31.8 g/dL (ref 30.0–36.0)
MCV: 86.5 fL (ref 80.0–100.0)
Platelets: 295 10*3/uL (ref 150–400)
RBC: 4.83 MIL/uL (ref 4.22–5.81)
RDW: 13.4 % (ref 11.5–15.5)
WBC: 7.1 10*3/uL (ref 4.0–10.5)
nRBC: 0 % (ref 0.0–0.2)

## 2020-08-24 MED ORDER — PANTOPRAZOLE SODIUM 20 MG PO TBEC: 20.0000 mg | DELAYED_RELEASE_TABLET | Freq: Every day | ORAL | 0 refills | Status: DC

## 2020-08-24 MED ORDER — SUCRALFATE 1 GM/10ML PO SUSP: 1.0000 g | Freq: Three times a day (TID) | ORAL | 0 refills | Status: DC

## 2020-08-24 NOTE — ED Provider Notes (Signed)
[facility] EMERGENCY DEPARTMENT Provider Note   CSN: [id number] Arrival date & time: 08/23/20  2219    History Chief Complaint  Patient presents with  . Chest Pain    Juan Tanner is a 55 y.o. male with a hx of GERD who presents to the ED with complaints of chest pain x 1 week. Patient states pain is intermittent, lasts a few minutes at a time, on further questioning typically happens after eating, feels like a sharp/burning pain to the throat, chest and back. No other alleviating/aggravating factors. NO change with deep breath or exertion. He used to take acid refluxe medicine but stopped a year or so ago. Denies fever, chills, N/V, diaphoresis, dyspnea, syncope, melena, abdominal pain, leg pain/swelling, hemoptysis, recent surgery/trauma, recent long travel, hormone use, personal hx of cancer, or hx of DVT/PE. No pain at present.    HPI     Past Medical History:  Diagnosis Date  . Blood in stool   . GERD (gastroesophageal reflux disease)   . Premature ventricular contraction    outflow tract pvcs    Patient Active Problem List   Diagnosis Date Noted  . Premature ventricular contractions 06/06/2012  . GERD 02/16/2008    Past Surgical History:  Procedure Laterality Date  . none         Family History  Problem Relation Age of Onset  . Heart attack Neg Hx     Social History   Tobacco Use  . Smoking status: Never Smoker  . Smokeless tobacco: Never Used  Vaping Use  . Vaping Use: Never used  Substance Use Topics  . Alcohol use: No  . Drug use: No    Home Medications Prior to Admission medications   Medication Sig Start Date End Date Taking? Authorizing Provider  acetaminophen (TYLENOL) 500 MG tablet Take 1,000 mg by mouth every 6 (six) hours as needed for mild pain, moderate pain or headache.    [provider]    Allergies    Patient has no known allergies.  Review of Systems   Review of Systems  Constitutional: Negative  for chills, diaphoresis and fever.  Respiratory: Negative for shortness of breath.   Cardiovascular: Positive for chest pain. Negative for leg swelling.  Gastrointestinal: Negative for abdominal pain, blood in stool, diarrhea, nausea and vomiting.  Genitourinary: Negative for dysuria.  Musculoskeletal: Positive for back pain.  Neurological: Negative for syncope.  All other systems reviewed and are negative.  Physical Exam Updated Vital Signs BP 126/88   Pulse (!) 55   Temp (!) 97.3 F (36.3 C) (Oral)   Resp 16   Ht 5\' 5"  (1.651 m)   Wt 70.3 kg   SpO2 100%   BMI 25.79 kg/m   Physical Exam Vitals and nursing note reviewed.  Constitutional:      General: He is not in acute distress.    Appearance: He is well-developed. He is not toxic-appearing.  HENT:     Head: Normocephalic and atraumatic.  Eyes:     General:        Right eye: No discharge.        Left eye: No discharge.     Conjunctiva/sclera: Conjunctivae normal.  Cardiovascular:     Rate and Rhythm: Regular rhythm. Bradycardia present.  Pulmonary:     Effort: Pulmonary effort is normal. No respiratory distress.     Breath sounds: Normal breath sounds. No wheezing, rhonchi or rales.  Chest:     Chest wall: No  tenderness.  Abdominal:     General: There is no distension.     Palpations: Abdomen is soft.     Tenderness: There is no abdominal tenderness. There is no guarding or rebound.     Comments: Negative murphys. No rigidity.   Musculoskeletal:     Cervical back: Neck supple.     Right lower leg: No tenderness. No edema.     Left lower leg: No tenderness. No edema.  Skin:    General: Skin is warm and dry.     Findings: No rash.  Neurological:     Mental Status: He is alert.     Comments: Clear speech.   Psychiatric:        Behavior: Behavior normal.     ED Results / Procedures / Treatments   Labs (all labs ordered are listed, but only abnormal results are displayed) Labs Reviewed  COMPREHENSIVE  METABOLIC PANEL - Abnormal; Notable for the following components:      Result Value   Calcium 8.7 (*)    All other components within normal limits  CBC  TROPONIN I (HIGH SENSITIVITY)  TROPONIN I (HIGH SENSITIVITY)    EKG None  Radiology DG Chest 2 View  Result Date: 08/23/2020 CLINICAL DATA:  Chest pain. Left-sided pain, intermittent for 1 week. EXAM: CHEST - 2 VIEW COMPARISON:  07/17/2018 FINDINGS: Upper normal heart size. Normal mediastinal contours. Minimal vascular congestion without pulmonary edema. No focal airspace disease, pleural effusion, or pneumothorax. No acute osseous abnormalities are seen. IMPRESSION: Borderline cardiomegaly with minimal vascular congestion. Electronically Signed   By: Narda Rutherford M.D.   On: 08/23/2020 23:39    Procedures Procedures   Medications Ordered in ED Medications - No data to display  ED Course  I have reviewed the triage vital signs and the nursing notes.  Pertinent labs & imaging results that were available during my care of the patient were reviewed by me and considered in my medical decision making (see chart for details).    MDM Rules/Calculators/A&P                         Patient presents to the ED with complaints of chest pain.  Patient is nontoxic, mildly bradycardic, vitals otherwise fairly unremarkable.  Exam is benign.  Additional history obtained:  Additional history obtained from chart review & nursing note review.   EKG: No STEMI, no acute ischemia noted.  Lab Tests:  I Ordered, reviewed, and interpreted labs, which included:  CBC: Unremarkable. CMP: Mild hypocalcemia. No significant electrolyte derangement.  Troponin: No significant elevation.   Imaging Studies ordered:  I ordered imaging studies which included CXR, I independently reviewed, formal radiology impression shows borderline cardiomegaly with minimal vascular congestion.  ED Course:  Low risk Wells, low suspicion for PE.  Troponins are flat, EKG  without acute ischemia, not suspect ACS.  Chest x-ray without pneumonia or pneumothorax.  Chest x-ray with borderline cardiomegaly and minimal vascular congestion per radiology read, patient is not short of breath, he denies orthopnea/PND, he has no peripheral edema & his lungs are clear on exam, will have him follow-up with PCP for this but does not seem like an acute CHF presentation.  Given pain occurs after eating and he has not been on his GERD medication feel this is the most likely, however overall unclear definitive etiology.  Abdomen is nontender without peritoneal signs, no current pain, do not suspect acute surgical process.  Will restart PPI  and Carafate with PCP follow-up. I discussed results, treatment plan, need for follow-up, and return precautions with the patient. Provided opportunity for questions, patient confirmed understanding and is in agreement with plan.   Portions of this note were generated with Scientist, clinical (histocompatibility and immunogenetics). Dictation errors may occur despite best attempts at proofreading.  Final Clinical Impression(s) / ED Diagnoses Final diagnoses:  Chest pain, unspecified type    Rx / DC Orders ED Discharge Orders         Ordered    sucralfate (CARAFATE) 1 GM/10ML suspension  3 times daily with meals & bedtime        08/24/20 0644    pantoprazole (PROTONIX) 20 MG tablet  Daily        08/24/20 0644           Cherly Anderson, PA-C 08/24/20 8416    Geoffery Lyons, MD 08/25/20 [phone]

## 2020-08-24 NOTE — ED Notes (Signed)
Pt discharged and ambulated out of the ED without difficulty. 

## 2020-08-24 NOTE — Discharge Instructions (Signed)
You are seen in the ER today for chest pain, your labs and EKG were overall reassuring.  Your chest x-ray showed that your heart looks mildly enlarged, we would like you to follow-up with your primary care provider in regards to this, you may benefit from an echocardiogram which is an ultrasound to look at your heart. We are sending you home with the following medications to help with your symptoms:  - Protonix- please take 1 tablet in the morning prior to any meals to help with stomach acidity/pain.  - Carafate- please take prior to each meal and prior to bedtime to help with stomach acidity/pain.   We have prescribed you new medication(s) today. Discuss the medications prescribed today with your pharmacist as they can have adverse effects and interactions with your other medicines including over the counter and prescribed medications. Seek medical evaluation if you start to experience new or abnormal symptoms after taking one of these medicines, seek care immediately if you start to experience difficulty breathing, feeling of your throat closing, facial swelling, or rash as these could be indications of a more serious allergic reaction  Please follow attached diet guidelines.   Follow up with your primary care provider within 3 days for re-evaluation.  Return to the ER for new or worsening symptoms including but not limited to worsened pain, new pain, inability to keep fluids down, blood in vomit/stool, passing out, trouble breathing, or any other concerns.

## 2020-11-01 ENCOUNTER — Other Ambulatory Visit: Payer: Self-pay

## 2020-11-01 ENCOUNTER — Ambulatory Visit (HOSPITAL_COMMUNITY)
Admission: EM | Admit: 2020-11-01 | Discharge: 2020-11-01 | Disposition: A | Discharge status: Home / Self Care | Payer: BLUE CROSS/BLUE SHIELD

## 2020-11-01 ENCOUNTER — Ambulatory Visit (INDEPENDENT_AMBULATORY_CARE_PROVIDER_SITE_OTHER): Payer: BLUE CROSS/BLUE SHIELD

## 2020-11-01 ENCOUNTER — Encounter (HOSPITAL_COMMUNITY): Payer: Self-pay

## 2020-11-01 DIAGNOSIS — M1009 Idiopathic gout, multiple sites: Secondary | ICD-10-CM | POA: Diagnosis not present

## 2020-11-01 DIAGNOSIS — M25532 Pain in left wrist: Secondary | ICD-10-CM

## 2020-11-01 DIAGNOSIS — M25521 Pain in right elbow: Secondary | ICD-10-CM | POA: Diagnosis not present

## 2020-11-01 MED ORDER — PREDNISONE 20 MG PO TABS: 40.0000 mg | ORAL_TABLET | Freq: Every day | ORAL | 0 refills | Status: AC

## 2020-11-01 MED ORDER — KETOROLAC TROMETHAMINE 30 MG/ML IJ SOLN
INTRAMUSCULAR | Status: AC
  Filled 2020-11-01: qty 1

## 2020-11-01 MED ORDER — KETOROLAC TROMETHAMINE 30 MG/ML IJ SOLN
30.0000 mg | Freq: Once | INTRAMUSCULAR | Status: AC
  Administered 2020-11-01: 30 mg via INTRAMUSCULAR

## 2020-11-01 NOTE — ED Triage Notes (Signed)
Pt c/o bilateral arm and bilateral leg pain that started two days ago. Left wrist swollen  Interventions: Tylenol- somewhat helpful

## 2020-11-01 NOTE — ED Provider Notes (Signed)
[facility]    CSN: [id number] Arrival date & time: 11/01/20  1221      History   Chief Complaint Chief Complaint  Patient presents with   generalized pain     HPI Juan Tanner is a 55 y.o. male presenting with generalized pain of multiple joints for the last 2 days.  Medical history noncontributory.  Notes initially pain in the right knee, this has improved but now he is experiencing pain in the left knee, right elbow , and severe left wrist pain. Advil providing minima relief. Denies trauma, recent travel, fever/chills, chest pain, shortness of breath, sensation changes, insect bites.  Denies history of gout.  Recent seafood but no recent red meat, alcohol consumption.  HPI  Past Medical History:  Diagnosis Date   Blood in stool    GERD (gastroesophageal reflux disease)    Premature ventricular contraction    outflow tract pvcs    Patient Active Problem List   Diagnosis Date Noted   Premature ventricular contractions 06/06/2012   GERD 02/16/2008    Past Surgical History:  Procedure Laterality Date   none         Home Medications    Prior to Admission medications   Medication Sig Start Date End Date Taking? Authorizing Provider  acetaminophen (TYLENOL) 500 MG tablet Take 1,000 mg by mouth every 6 (six) hours as needed for mild pain, moderate pain or headache.   Yes [provider]  predniSONE (DELTASONE) 20 MG tablet Take 2 tablets (40 mg total) by mouth daily for 5 days. 11/01/20 11/06/20 Yes Rhys Martini, PA-C  pantoprazole (PROTONIX) 20 MG tablet Take 1 tablet (20 mg total) by mouth daily. 08/24/20   Petrucelli, Pleas Koch, PA-C    Family History Family History  Problem Relation Age of Onset   Heart attack Neg Hx     Social History Social History   Tobacco Use   Smoking status: Never   Smokeless tobacco: Never  Vaping Use   Vaping Use: Never used  Substance Use Topics   Alcohol use: No   Drug use: No     Allergies    Patient has no known allergies.   Review of Systems Review of Systems  Musculoskeletal:        Pain of multiple joints  All other systems reviewed and are negative.   Physical Exam Triage Vital Signs ED Triage Vitals  Enc Vitals Group     BP 11/01/20 1326 (!) 150/95     Pulse Rate 11/01/20 1326 94     Resp 11/01/20 1326 20     Temp 11/01/20 1326 98.5 F (36.9 C)     Temp Source 11/01/20 1326 Oral     SpO2 11/01/20 1326 100 %     Weight --      Height --      Head Circumference --      Peak Flow --      Pain Score 11/01/20 1323 10     Pain Loc --      Pain Edu? --      Excl. in GC? --    No data found.  Updated Vital Signs BP (!) 150/95 (BP Location: Right Arm)   Pulse 94   Temp 98.5 F (36.9 C) (Oral)   Resp 20   SpO2 100%   Visual Acuity Right Eye Distance:   Left Eye Distance:   Bilateral Distance:    Right Eye Near:   Left Eye Near:  Bilateral Near:     Physical Exam Vitals reviewed.  Constitutional:      General: He is not in acute distress.    Appearance: Normal appearance. He is not ill-appearing or diaphoretic.  HENT:     Head: Normocephalic and atraumatic.  Cardiovascular:     Rate and Rhythm: Normal rate and regular rhythm.     Heart sounds: Normal heart sounds.  Pulmonary:     Effort: Pulmonary effort is normal.     Breath sounds: Normal breath sounds.  Musculoskeletal:     Comments: R elbow- TTP over olecranon and lateral epicondyle. Sensation intact. R grip strength 5/5, sensation intact, cap refill <2 seconds, radial pulse 2+.  L wrist- diffusely swollen and TTP, worse over dorsal aspect. ROM limited due to pain. Warm but nonerythematous. Grip strength 2/5, sensation intact, radial pulse 2+, cap refill <2 seconds.   R knee- no tenderness or effucion.   L knee- minimally tender with extension and flexion. No effusion.   No pedal edema or calf swelling/tenderness.  Skin:    General: Skin is warm.  Neurological:     General: No  focal deficit present.     Mental Status: He is alert and oriented to person, place, and time.  Psychiatric:        Mood and Affect: Mood normal.        Behavior: Behavior normal.        Thought Content: Thought content normal.        Judgment: Judgment normal.     UC Treatments / Results  Labs (all labs ordered are listed, but only abnormal results are displayed) Labs Reviewed - No data to display  EKG   Radiology DG Elbow Complete Right  Result Date: 11/01/2020 CLINICAL DATA:  Nontraumatic pain and swelling EXAM: RIGHT ELBOW - COMPLETE 3+ VIEW COMPARISON:  None. FINDINGS: There is no evidence of fracture, dislocation, or joint effusion. There is no evidence of arthropathy or other focal bone abnormality. Soft tissues are unremarkable. IMPRESSION: Negative. Electronically Signed   By: Marlan Palau M.D.   On: 11/01/2020 14:30   DG Wrist Complete Left  Result Date: 11/01/2020 CLINICAL DATA:  Nontraumatic swelling and pain of the left wrist. EXAM: LEFT WRIST - COMPLETE 3+ VIEW COMPARISON:  None. FINDINGS: No signs of acute fracture or dislocation identified. The joint spaces appear normal. No suspicious bone erosions identified. Mild soft tissue swelling is identified along the dorsum of the wrist. IMPRESSION: 1. No acute bone abnormality. 2. Mild dorsal soft tissue swelling. Electronically Signed   By: Signa Kell M.D.   On: 11/01/2020 14:28    Procedures Procedures (including critical care time)  Medications Ordered in UC Medications  ketorolac (TORADOL) 30 MG/ML injection 30 mg (has no administration in time range)    Initial Impression / Assessment and Plan / UC Course  I have reviewed the triage vital signs and the nursing notes.  Pertinent labs & imaging results that were available during my care of the patient were reviewed by me and considered in my medical decision making (see chart for details).     This patient is a very pleasant 55 y.o. year old male  presenting with gout following eating fish. No recent trauma or overuse. Neurovascularly intact. Afebrile, nontachy. Last NSAID 9 hours ago.  Creatinine normal range 08/23/20. Toradol IM today. Prednisone sent.   Xray L wrist- negative. Xray R elbow- negative.  Low purine diet.   ED return precautions discussed. Patient verbalizes  understanding and agreement.   Coding this visit a Level 4 for review of past labs and notes, order and interpretation of labs today, and prescription drug management.  Final Clinical Impressions(s) / UC Diagnoses   Final diagnoses:  Acute idiopathic gout of multiple sites     Discharge Instructions      -Prednisone, 2 pills taken at the same time for 5 days in a row.  Try taking this earlier in the day as it can give you energy. Avoid NSAIDs like ibuprofen and alleve while taking this medication as they can increase your risk of stomach upset and even GI bleeding when in combination with a steroid. You can continue tylenol (acetaminophen) up to 1000mg  3x daily. -Tylenol for pain     ED Prescriptions     Medication Sig Dispense Auth. Provider   predniSONE (DELTASONE) 20 MG tablet Take 2 tablets (40 mg total) by mouth daily for 5 days. 10 tablet , PA-C      PDMP not reviewed this encounter.   Rhys Martini, PA-C 11/01/20 1513

## 2020-11-01 NOTE — Discharge Instructions (Addendum)
-  Prednisone, 2 pills taken at the same time for 5 days in a row.  Try taking this earlier in the day as it can give you energy. Avoid NSAIDs like ibuprofen and alleve while taking this medication as they can increase your risk of stomach upset and even GI bleeding when in combination with a steroid. You can continue tylenol (acetaminophen) up to 1000mg  3x daily. -Tylenol for pain

## 2020-11-03 ENCOUNTER — Emergency Department (HOSPITAL_COMMUNITY)
Admission: EM | Admit: 2020-11-03 | Discharge: 2020-11-03 | Disposition: A | Discharge status: Home / Self Care | Payer: BLUE CROSS/BLUE SHIELD | Attending: Emergency Medicine | Admitting: Emergency Medicine

## 2020-11-03 ENCOUNTER — Telehealth (HOSPITAL_COMMUNITY): Payer: Self-pay | Admitting: Emergency Medicine

## 2020-11-03 ENCOUNTER — Other Ambulatory Visit: Payer: Self-pay

## 2020-11-03 ENCOUNTER — Encounter (HOSPITAL_COMMUNITY): Payer: Self-pay | Admitting: Emergency Medicine

## 2020-11-03 DIAGNOSIS — M25532 Pain in left wrist: Secondary | ICD-10-CM | POA: Diagnosis present

## 2020-11-03 DIAGNOSIS — M109 Gout, unspecified: Secondary | ICD-10-CM | POA: Diagnosis not present

## 2020-11-03 DIAGNOSIS — M25562 Pain in left knee: Secondary | ICD-10-CM | POA: Insufficient documentation

## 2020-11-03 MED ORDER — INDOMETHACIN 25 MG PO CAPS
50.0000 mg | ORAL_CAPSULE | Freq: Once | ORAL | Status: AC
  Administered 2020-11-03: 50 mg via ORAL
  Filled 2020-11-03: qty 2

## 2020-11-03 MED ORDER — ACETAMINOPHEN 500 MG PO TABS
1000.0000 mg | ORAL_TABLET | Freq: Once | ORAL | Status: AC
  Administered 2020-11-03: 1000 mg via ORAL
  Filled 2020-11-03: qty 2

## 2020-11-03 MED ORDER — LIDOCAINE 5 % EX PTCH
1.0000 | MEDICATED_PATCH | CUTANEOUS | 0 refills | Status: DC
Start: 2020-11-03 — End: 2020-11-03

## 2020-11-03 MED ORDER — LIDOCAINE 5 % EX PTCH: 1.0000 | MEDICATED_PATCH | CUTANEOUS | 0 refills | Status: DC

## 2020-11-03 MED ORDER — INDOMETHACIN 25 MG PO CAPS: 25.0000 mg | ORAL_CAPSULE | Freq: Three times a day (TID) | ORAL | 0 refills | Status: DC | PRN

## 2020-11-03 MED ORDER — LIDOCAINE 5 % EX PTCH
1.0000 | MEDICATED_PATCH | CUTANEOUS | Status: DC
  Filled 2020-11-03: qty 1

## 2020-11-03 MED ORDER — INDOMETHACIN 25 MG PO CAPS: 25.0000 mg | ORAL_CAPSULE | Freq: Three times a day (TID) | ORAL | 0 refills | Status: DC

## 2020-11-03 NOTE — ED Triage Notes (Signed)
Patient complaining about swollen left wrist. Patient states he has not had any injury to that wrist. Patient was seen on 11/01/2020 for the samething.

## 2020-11-03 NOTE — ED Provider Notes (Signed)
He contacted the charge nurse to request that his medicines be sent to the [facility] on [address].  I have done that.   Mancel Bale, MD 11/03/20 [phone]

## 2020-11-03 NOTE — ED Provider Notes (Signed)
[facility]-EMERGENCY DEPT Provider Note   CSN: [id number] Arrival date & time: 11/03/20  0509     History Chief Complaint  Patient presents with   Hand Pain    Juan Tanner is a 55 y.o. male.  The history is provided by the patient.  Hand Pain This is a new problem. The current episode started more than 2 days ago (4 days ago started having Left wrist and left knee pain). The problem occurs constantly. The problem has not changed since onset.Pertinent negatives include no chest pain, no abdominal pain, no headaches and no shortness of breath. Nothing aggravates the symptoms. Nothing relieves the symptoms. Treatments tried: prednisone. The treatment provided no relief.      Past Medical History:  Diagnosis Date   Blood in stool    GERD (gastroesophageal reflux disease)    Premature ventricular contraction    outflow tract pvcs    Patient Active Problem List   Diagnosis Date Noted   Premature ventricular contractions 06/06/2012   GERD 02/16/2008    Past Surgical History:  Procedure Laterality Date   none         Family History  Problem Relation Age of Onset   Heart attack Neg Hx     Social History   Tobacco Use   Smoking status: Never   Smokeless tobacco: Never  Vaping Use   Vaping Use: Never used  Substance Use Topics   Alcohol use: No   Drug use: No    Home Medications Prior to Admission medications   Medication Sig Start Date End Date Taking? Authorizing Provider  acetaminophen (TYLENOL) 500 MG tablet Take 1,000 mg by mouth every 6 (six) hours as needed for mild pain, moderate pain or headache.    [provider]  pantoprazole (PROTONIX) 20 MG tablet Take 1 tablet (20 mg total) by mouth daily. 08/24/20   Petrucelli, Samantha R, PA-C  predniSONE (DELTASONE) 20 MG tablet Take 2 tablets (40 mg total) by mouth daily for 5 days. 11/01/20 11/06/20  Rhys Martini, PA-C    Allergies    Patient has no known  allergies.  Review of Systems   Review of Systems  Constitutional:  Negative for fever.  HENT:  Negative for facial swelling.   Eyes:  Negative for redness.  Respiratory:  Negative for shortness of breath.   Cardiovascular:  Negative for chest pain.  Gastrointestinal:  Negative for abdominal pain.  Musculoskeletal:  Positive for arthralgias.  Skin:  Negative for wound.  Neurological:  Negative for headaches.  Psychiatric/Behavioral:  Negative for agitation.   All other systems reviewed and are negative.  Physical Exam Updated Vital Signs BP 130/90 (BP Location: Left Arm)   Pulse 66   Temp 97.8 F (36.6 C) (Oral)   Resp 15   Ht 5\' 6"  (1.676 m)   Wt 74.8 kg   SpO2 97%   BMI 26.63 kg/m   Physical Exam Vitals and nursing note reviewed.  Constitutional:      General: He is not in acute distress.    Appearance: Normal appearance.  HENT:     Head: Normocephalic and atraumatic.     Nose: Nose normal.  Eyes:     Conjunctiva/sclera: Conjunctivae normal.     Pupils: Pupils are equal, round, and reactive to light.  Cardiovascular:     Rate and Rhythm: Normal rate and regular rhythm.     Pulses: Normal pulses.     Heart sounds: Normal heart sounds.  Pulmonary:  Effort: Pulmonary effort is normal.     Breath sounds: Normal breath sounds.  Abdominal:     General: Abdomen is flat. Bowel sounds are normal.     Palpations: Abdomen is soft.     Tenderness: There is no abdominal tenderness. There is no guarding.  Musculoskeletal:     Left wrist: Swelling present. No effusion, lacerations, bony tenderness, snuff box tenderness or crepitus. Normal range of motion. Normal pulse.     Cervical back: Normal range of motion and neck supple.     Comments: Warmth of the right wrist, no fluctuance no erythema.    Skin:    General: Skin is warm and dry.     Capillary Refill: Capillary refill takes less than 2 seconds.     Findings: No erythema.  Neurological:     General: No focal  deficit present.     Mental Status: He is alert and oriented to person, place, and time.     Deep Tendon Reflexes: Reflexes normal.  Psychiatric:        Mood and Affect: Mood normal.        Behavior: Behavior normal.    ED Results / Procedures / Treatments   Labs (all labs ordered are listed, but only abnormal results are displayed) Labs Reviewed - No data to display  EKG None  Radiology DG Elbow Complete Right  Result Date: 11/01/2020 CLINICAL DATA:  Nontraumatic pain and swelling EXAM: RIGHT ELBOW - COMPLETE 3+ VIEW COMPARISON:  None. FINDINGS: There is no evidence of fracture, dislocation, or joint effusion. There is no evidence of arthropathy or other focal bone abnormality. Soft tissues are unremarkable. IMPRESSION: Negative. Electronically Signed   By: Marlan Palau M.D.   On: 11/01/2020 14:30   DG Wrist Complete Left  Result Date: 11/01/2020 CLINICAL DATA:  Nontraumatic swelling and pain of the left wrist. EXAM: LEFT WRIST - COMPLETE 3+ VIEW COMPARISON:  None. FINDINGS: No signs of acute fracture or dislocation identified. The joint spaces appear normal. No suspicious bone erosions identified. Mild soft tissue swelling is identified along the dorsum of the wrist. IMPRESSION: 1. No acute bone abnormality. 2. Mild dorsal soft tissue swelling. Electronically Signed   By: Signa Kell M.D.   On: 11/01/2020 14:28    Procedures Procedures   Medications Ordered in ED Medications  indomethacin (INDOCIN) capsule 50 mg (has no administration in time range)  acetaminophen (TYLENOL) tablet 1,000 mg (has no administration in time range)  lidocaine (LIDODERM) 5 % 1 patch (has no administration in time range)    ED Course  I have reviewed the triage vital signs and the nursing notes.  Pertinent labs & imaging results that were available during my care of the patient were reviewed by me and considered in my medical decision making (see chart for details).   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  Tenderness of the right knee but not swollen.  I will start indomethacin for pain and swelling and refer to community health and wellness for uric acid testing.  Stable for discharge with close follow up.    Juan Tanner was evaluated in Emergency Department on 11/03/2020 for the symptoms described in the history of present illness. He was evaluated in the context of the global COVID-19 pandemic, which necessitated consideration that the patient might be at risk for infection with the SARS-CoV-2 virus that causes COVID-19. Institutional protocols and algorithms that pertain to the evaluation of patients at risk for COVID-19 are  in a state of rapid change based on information released by regulatory bodies including the CDC and federal and state organizations. These policies and algorithms were followed during the patient's care in the ED.  Final Clinical Impression(s) / ED Diagnoses Final diagnoses:  Acute gout, unspecified cause, unspecified site   Return for intractable cough, coughing up blood, fevers > 100.4 unrelieved by medication, shortness of breath, intractable vomiting, chest pain, shortness of breath, weakness, numbness, changes in speech, facial asymmetry, abdominal pain, passing out, Inability to tolerate liquids or food, cough, altered mental status or any concerns. No signs of systemic illness or infection. The patient is nontoxic-appearing on exam and vital signs are within normal limits. I have reviewed the triage vital signs and the nursing notes. Pertinent labs & imaging results that were available during my care of the patient were reviewed by me and considered in my medical decision making (see chart for details). After history, exam, and medical workup I feel the patient has been appropriately medically screened and is safe for discharge home. Pertinent diagnoses were discussed with the patient. Patient was given return precautions   Rx / DC Orders ED  Discharge Orders     None        Haniya Fern, MD 11/03/20 3559

## 2020-11-04 ENCOUNTER — Encounter: Payer: Self-pay | Admitting: Physician Assistant

## 2020-11-04 ENCOUNTER — Ambulatory Visit (INDEPENDENT_AMBULATORY_CARE_PROVIDER_SITE_OTHER): Payer: BLUE CROSS/BLUE SHIELD | Admitting: Physician Assistant

## 2020-11-04 VITALS — BP 153/102 | HR 88 | Temp 98.3°F | Resp 18 | Ht 65.0 in | Wt 182.0 lb

## 2020-11-04 DIAGNOSIS — M109 Gout, unspecified: Secondary | ICD-10-CM

## 2020-11-04 DIAGNOSIS — M25562 Pain in left knee: Secondary | ICD-10-CM

## 2020-11-04 NOTE — Progress Notes (Signed)
Established Patient Office Visit  Subjective:  Patient ID: Juan Tanner, male    DOB: [birth year]  Age: 55 y.o. MRN: [id number]  CC:  Chief Complaint  Patient presents with   Hospitalization Follow-up    Arm Pain R Gout    HPI Juan Tanner reports that he was seen in the emergency department yesterday.    ED Course  I have reviewed the triage vital signs and the nursing notes.   Pertinent labs & imaging results that were available during my care of the patient were reviewed by me and considered in my medical decision making (see chart for details).    Exam is consistent with gout of the left wrist.  Tenderness of the right knee but not swollen.  I will start indomethacin for pain and swelling and refer to community health and wellness for uric acid testing.  Stable for discharge with close follow up.        States today that he continues to have left wrist and left knee pain that started approximately 5 days ago.  Reports that he has started the treatment that was given but states he has not had any relief yet.  Denies injury or trauma to his wrist or knee.  Reports he previously had pain in multiple joints, states these 2 are the last ones with continued pain  Due to language barrier, an interpreter was present during the history-taking and subsequent discussion (and for part of the physical exam) with this patient.      Past Medical History:  Diagnosis Date   Blood in stool    GERD (gastroesophageal reflux disease)    Premature ventricular contraction    outflow tract pvcs    Past Surgical History:  Procedure Laterality Date   none      Family History  Problem Relation Age of Onset   Heart attack Neg Hx     Social History   Socioeconomic History   Marital status: Married    Spouse name: Not on file   Number of children: 5   Years of education: Not on file   Highest education level: Not on file  Occupational History   Not on file  Tobacco Use    Smoking status: Never   Smokeless tobacco: Never  Vaping Use   Vaping Use: Never used  Substance and Sexual Activity   Alcohol use: No   Drug use: No   Sexual activity: Yes    Partners: Female  Other Topics Concern   Not on file  Social History Narrative   Pt is from [place], moved here in 1998.    Lives in [place] with spouse and children.   Works in [employer]   Social Determinants of Corporate investment banker Strain: Not on file  Food Insecurity: Not on file  Transportation Needs: Not on file  Physical Activity: Not on file  Stress: Not on file  Social Connections: Not on file  Intimate Partner Violence: Not on file    Outpatient Medications Prior to Visit  Medication Sig Dispense Refill   acetaminophen (TYLENOL) 500 MG tablet Take 1,000 mg by mouth every 6 (six) hours as needed for mild pain, moderate pain or headache.     indomethacin (INDOCIN) 25 MG capsule Take 1 capsule (25 mg total) by mouth 3 (three) times daily as needed. 30 capsule 0   predniSONE (DELTASONE) 20 MG tablet Take 2 tablets (40 mg total) by mouth daily for 5 days. 10 tablet  0   lidocaine (LIDODERM) 5 % Place 1 patch onto the skin daily. Remove & Discard patch within 12 hours or as directed by MD (Patient not taking: Reported on 11/04/2020) 30 patch 0   pantoprazole (PROTONIX) 20 MG tablet Take 1 tablet (20 mg total) by mouth daily. (Patient not taking: Reported on 11/04/2020) 30 tablet 0   No facility-administered medications prior to visit.    No Known Allergies  ROS Review of Systems  Constitutional:  Negative for chills and fever.  HENT: Negative.    Eyes: Negative.   Respiratory:  Negative for shortness of breath.   Cardiovascular:  Negative for chest pain.  Gastrointestinal: Negative.   Endocrine: Negative.   Genitourinary: Negative.   Musculoskeletal:  Positive for joint swelling.  Skin: Negative.   Allergic/Immunologic: Negative.   Neurological: Negative.    Hematological: Negative.   Psychiatric/Behavioral: Negative.       Objective:    Physical Exam Vitals and nursing note reviewed.  Constitutional:      Appearance: Normal appearance.  HENT:     Head: Normocephalic and atraumatic.     Right Ear: External ear normal.     Left Ear: External ear normal.     Nose: Nose normal.     Mouth/Throat:     Mouth: Mucous membranes are moist.     Pharynx: Oropharynx is clear.  Eyes:     Extraocular Movements: Extraocular movements intact.     Conjunctiva/sclera: Conjunctivae normal.     Pupils: Pupils are equal, round, and reactive to light.  Cardiovascular:     Rate and Rhythm: Normal rate and regular rhythm.     Pulses: Normal pulses.     Heart sounds: Normal heart sounds.  Pulmonary:     Effort: Pulmonary effort is normal.     Breath sounds: Normal breath sounds.  Musculoskeletal:     Right wrist: Normal.     Left wrist: Swelling and tenderness present. Decreased range of motion.     Cervical back: Normal range of motion and neck supple.     Right knee: Normal.     Left knee: No swelling. Tenderness present.     Comments: Left wrist Slight swelling noted, tender to light touch Left knee tender to light touch, no swelling noted   Skin:    General: Skin is warm and dry.  Neurological:     General: No focal deficit present.     Mental Status: He is alert and oriented to person, place, and time.  Psychiatric:        Mood and Affect: Mood normal.        Behavior: Behavior normal.        Thought Content: Thought content normal.        Judgment: Judgment normal.    BP (!) 153/102 (BP Location: Left Arm, Patient Position: Sitting, Cuff Size: Normal)   Pulse 88   Temp 98.3 F (36.8 C) (Temporal)   Resp 18   Ht 5\' 5"  (1.651 m)   Wt 182 lb (82.6 kg)   SpO2 95%   BMI 30.29 kg/m  Wt Readings from Last 3 Encounters:  11/04/20 182 lb (82.6 kg)  11/03/20 165 lb (74.8 kg)  08/23/20 155 lb (70.3 kg)     Health Maintenance Due   Topic Date Due   COVID-19 Vaccine (1) Never done   COLONOSCOPY (Pts 45-44yrs Insurance coverage will need to be confirmed)  Never done   Zoster Vaccines- Shingrix (1 of 2) Never  done   INFLUENZA VACCINE  11/04/2020    There are no preventive care reminders to display for this patient.  Lab Results  Component Value Date   TSH 0.583 12/21/2017   Lab Results  Component Value Date   WBC 7.1 08/23/2020   HGB 13.3 08/23/2020   HCT 41.8 08/23/2020   MCV 86.5 08/23/2020   PLT 295 08/23/2020   Lab Results  Component Value Date   NA 135 08/23/2020   K 4.0 08/23/2020   CO2 23 08/23/2020   GLUCOSE 90 08/23/2020   BUN 15 08/23/2020   CREATININE 1.00 08/23/2020   BILITOT 1.0 08/23/2020   ALKPHOS 85 08/23/2020   AST 22 08/23/2020   ALT 21 08/23/2020   PROT 7.4 08/23/2020   ALBUMIN 3.7 08/23/2020   CALCIUM 8.7 (L) 08/23/2020   ANIONGAP 8 08/23/2020   Lab Results  Component Value Date   CHOL 223 (H) 12/21/2017   Lab Results  Component Value Date   HDL 44 12/21/2017   Lab Results  Component Value Date   LDLCALC 149 (H) 12/21/2017   Lab Results  Component Value Date   TRIG 152 (H) 12/21/2017   Lab Results  Component Value Date   CHOLHDL 5.1 (H) 12/21/2017   Lab Results  Component Value Date   HGBA1C 5.6 04/19/2017      Assessment & Plan:   Problem List Items Addressed This Visit   None  No orders of the defined types were placed in this encounter. 1. Acute gout of left wrist, unspecified cause Encouraged patient to continue prescribed regimen from emergency department visit.  Patient was given appointment to establish care with PCP at [facility].  Red flags given for prompt reevaluation.  2. Acute pain of left knee    I have reviewed the patient's medical history (PMH, PSH, Social History, Family History, Medications, and allergies) , and have been updated if relevant. I spent 30 minutes reviewing chart and  face to face time with  patient.     Follow-up: Return in about 4 weeks (around 12/02/2020) for To establish PCP.    Kasandra Knudsen Mayers, PA-C

## 2020-11-04 NOTE — Progress Notes (Signed)
Patient has taken indocin today. Patient has not eaten today. Patient reports arm pain at at 10 with swelling.

## 2020-11-04 NOTE — Patient Instructions (Signed)
Continue your medications as prescribed by the emergency department.  Make sure you are drinking plenty of water.  Roney Jaffe, PA-C Physician Assistant [facility] [email or web address]   Gout  Gout is a condition that causes painful swelling of the joints. Gout is a type of inflammation of the joints (arthritis). This condition is caused by having too much uric acid in the body. Uric acid is a chemical that forms when the body breaks down substances called purines.Purines are important for building body proteins. When the body has too much uric acid, sharp crystals can form and build up inside the joints. This causes pain and swelling. Gout attacks can happen quickly and may be very painful (acute gout). Over time, the attacks can affect more joints and become more frequent (chronic gout). Gout can also cause uric acid to build up under the skin and inside thekidneys. What are the causes? This condition is caused by too much uric acid in your blood. This can happen because: Your kidneys do not remove enough uric acid from your blood. This is the most common cause. Your body makes too much uric acid. This can happen with some cancers and cancer treatments. It can also occur if your body is breaking down too many red blood cells (hemolytic anemia). You eat too many foods that are high in purines. These foods include organ meats and some seafood. Alcohol, especially beer, is also high in purines. A gout attack may be triggered by trauma or stress. What increases the risk? You are more likely to develop this condition if you: Have a family history of gout. Are male and middle-aged. Are male and have gone through menopause. Are obese. Frequently drink alcohol, especially beer. Are dehydrated. Lose weight too quickly. Have an organ transplant. Have lead poisoning. Take certain medicines, including aspirin, cyclosporine, diuretics,  levodopa, and niacin. Have kidney disease. Have a skin condition called psoriasis. What are the signs or symptoms? An attack of acute gout happens quickly. It usually occurs in just one joint. The most common place is the big toe. Attacks often start at night. Other joints that may be affected include joints of the feet, ankle, knee, fingers, wrist, or elbow. Symptoms of this condition may include: Severe pain. Warmth. Swelling. Stiffness. Tenderness. The affected joint may be very painful to touch. Shiny, red, or purple skin. Chills and fever. Chronic gout may cause symptoms more frequently. More joints may be involved. You may also have white or yellow lumps (tophi) on your hands or feet or in other areas near your joints. How is this diagnosed? This condition is diagnosed based on your symptoms, medical history, and physical exam. You may have tests, such as: Blood tests to measure uric acid levels. Removal of joint fluid with a thin needle (aspiration) to look for uric acid crystals. X-rays to look for joint damage. How is this treated? Treatment for this condition has two phases: treating an acute attack and preventing future attacks. Acute gout treatment may include medicines to reduce pain and swelling, including: NSAIDs. Steroids. These are strong anti-inflammatory medicines that can be taken by mouth (orally) or injected into a joint. Colchicine. This medicine relieves pain and swelling when it is taken soon after an attack. It can be given by mouth or through an IV. Preventive treatment may include: Daily use of smaller doses of NSAIDs or colchicine. Use of a medicine that reduces uric acid levels in your blood. Changes to your diet. You may need  to see a dietitian about what to eat and drink to prevent gout. Follow these instructions at home: During a gout attack  If directed, put ice on the affected area: Put ice in a plastic bag. Place a towel between your skin and the  bag. Leave the ice on for 20 minutes, 2-3 times a day. Raise (elevate) the affected joint above the level of your heart as often as possible. Rest the joint as much as possible. If the affected joint is in your leg, you may be given crutches to use. Follow instructions from your health care provider about eating or drinking restrictions.  Avoiding future gout attacks Follow a low-purine diet as told by your dietitian or health care provider. Avoid foods and drinks that are high in purines, including liver, kidney, anchovies, asparagus, herring, mushrooms, mussels, and beer. Maintain a healthy weight or lose weight if you are overweight. If you want to lose weight, talk with your health care provider. It is important that you do not lose weight too quickly. Start or maintain an exercise program as told by your health care provider. Eating and drinking Drink enough fluids to keep your urine pale yellow. If you drink alcohol: Limit how much you use to: 0-1 drink a day for women. 0-2 drinks a day for men. Be aware of how much alcohol is in your drink. In the [place] one drink equals one 12 oz bottle of beer (355 mL) one 5 oz glass of wine (148 mL), or one 1 oz glass of hard liquor (44 mL). General instructions Take over-the-counter and prescription medicines only as told by your health care provider. Do not drive or use heavy machinery while taking prescription pain medicine. Return to your normal activities as told by your health care provider. Ask your health care provider what activities are safe for you. Keep all follow-up visits as told by your health care provider. This is important. Contact a health care provider if you have: Another gout attack. Continuing symptoms of a gout attack after 10 days of treatment. Side effects from your medicines. Chills or a fever. Burning pain when you urinate. Pain in your lower back or belly. Get help right away if you: Have severe or uncontrolled  pain. Cannot urinate. Summary Gout is painful swelling of the joints caused by inflammation. The most common site of pain is the big toe, but it can affect other joints in the body. Medicines and dietary changes can help to prevent and treat gout attacks. This information is not intended to replace advice given to you by your health care provider. Make sure you discuss any questions you have with your healthcare provider. Document Revised: 10/13/2017 Document Reviewed: 10/13/2017 Elsevier Patient Education  2022 [employer].

## 2020-11-08 ENCOUNTER — Emergency Department (HOSPITAL_COMMUNITY): Payer: BLUE CROSS/BLUE SHIELD

## 2020-11-08 ENCOUNTER — Encounter (HOSPITAL_COMMUNITY): Payer: Self-pay

## 2020-11-08 ENCOUNTER — Other Ambulatory Visit: Payer: Self-pay

## 2020-11-08 ENCOUNTER — Emergency Department (HOSPITAL_BASED_OUTPATIENT_CLINIC_OR_DEPARTMENT_OTHER): Payer: BLUE CROSS/BLUE SHIELD

## 2020-11-08 ENCOUNTER — Inpatient Hospital Stay (HOSPITAL_COMMUNITY)
Admission: EM | Admit: 2020-11-08 | Discharge: 2020-11-19 | DRG: 549 | Disposition: A | Discharge status: Home Health | Payer: BLUE CROSS/BLUE SHIELD | Attending: Internal Medicine | Admitting: Internal Medicine

## 2020-11-08 DIAGNOSIS — M659 Synovitis and tenosynovitis, unspecified: Secondary | ICD-10-CM | POA: Diagnosis present

## 2020-11-08 DIAGNOSIS — M2550 Pain in unspecified joint: Secondary | ICD-10-CM | POA: Diagnosis present

## 2020-11-08 DIAGNOSIS — R7401 Elevation of levels of liver transaminase levels: Secondary | ICD-10-CM | POA: Diagnosis present

## 2020-11-08 DIAGNOSIS — M00232 Other streptococcal arthritis, left wrist: Secondary | ICD-10-CM | POA: Diagnosis present

## 2020-11-08 DIAGNOSIS — E871 Hypo-osmolality and hyponatremia: Secondary | ICD-10-CM | POA: Diagnosis present

## 2020-11-08 DIAGNOSIS — M109 Gout, unspecified: Secondary | ICD-10-CM | POA: Diagnosis present

## 2020-11-08 DIAGNOSIS — M25461 Effusion, right knee: Secondary | ICD-10-CM | POA: Diagnosis present

## 2020-11-08 DIAGNOSIS — Z95828 Presence of other vascular implants and grafts: Secondary | ICD-10-CM

## 2020-11-08 DIAGNOSIS — M79671 Pain in right foot: Secondary | ICD-10-CM

## 2020-11-08 DIAGNOSIS — E669 Obesity, unspecified: Secondary | ICD-10-CM | POA: Diagnosis present

## 2020-11-08 DIAGNOSIS — Z20822 Contact with and (suspected) exposure to covid-19: Secondary | ICD-10-CM | POA: Diagnosis present

## 2020-11-08 DIAGNOSIS — M069 Rheumatoid arthritis, unspecified: Secondary | ICD-10-CM | POA: Diagnosis present

## 2020-11-08 DIAGNOSIS — M1990 Unspecified osteoarthritis, unspecified site: Secondary | ICD-10-CM | POA: Diagnosis not present

## 2020-11-08 DIAGNOSIS — M25432 Effusion, left wrist: Secondary | ICD-10-CM

## 2020-11-08 DIAGNOSIS — E538 Deficiency of other specified B group vitamins: Secondary | ICD-10-CM | POA: Diagnosis present

## 2020-11-08 DIAGNOSIS — Z79899 Other long term (current) drug therapy: Secondary | ICD-10-CM

## 2020-11-08 DIAGNOSIS — L03115 Cellulitis of right lower limb: Secondary | ICD-10-CM | POA: Diagnosis present

## 2020-11-08 DIAGNOSIS — D72829 Elevated white blood cell count, unspecified: Secondary | ICD-10-CM | POA: Diagnosis present

## 2020-11-08 DIAGNOSIS — I10 Essential (primary) hypertension: Secondary | ICD-10-CM | POA: Diagnosis present

## 2020-11-08 DIAGNOSIS — Z6830 Body mass index (BMI) 30.0-30.9, adult: Secondary | ICD-10-CM

## 2020-11-08 DIAGNOSIS — M79604 Pain in right leg: Secondary | ICD-10-CM

## 2020-11-08 DIAGNOSIS — D638 Anemia in other chronic diseases classified elsewhere: Secondary | ICD-10-CM | POA: Diagnosis present

## 2020-11-08 DIAGNOSIS — M609 Myositis, unspecified: Secondary | ICD-10-CM | POA: Diagnosis present

## 2020-11-08 DIAGNOSIS — M009 Pyogenic arthritis, unspecified: Secondary | ICD-10-CM | POA: Diagnosis present

## 2020-11-08 DIAGNOSIS — K219 Gastro-esophageal reflux disease without esophagitis: Secondary | ICD-10-CM | POA: Diagnosis present

## 2020-11-08 DIAGNOSIS — M00261 Other streptococcal arthritis, right knee: Principal | ICD-10-CM | POA: Diagnosis present

## 2020-11-08 DIAGNOSIS — M130 Polyarthritis, unspecified: Secondary | ICD-10-CM | POA: Diagnosis present

## 2020-11-08 DIAGNOSIS — B951 Streptococcus, group B, as the cause of diseases classified elsewhere: Secondary | ICD-10-CM | POA: Diagnosis present

## 2020-11-08 LAB — SYNOVIAL CELL COUNT + DIFF, W/ CRYSTALS
Crystals, Fluid: NONE SEEN
Eosinophils-Synovial: 0 % (ref 0–1)
Lymphocytes-Synovial Fld: 3 % (ref 0–20)
Monocyte-Macrophage-Synovial Fluid: 4 % — ABNORMAL LOW (ref 50–90)
Neutrophil, Synovial: 93 % — ABNORMAL HIGH (ref 0–25)
WBC, Synovial: 15350 /mm3 — ABNORMAL HIGH (ref 0–200)

## 2020-11-08 LAB — RESP PANEL BY RT-PCR (FLU A&B, COVID) ARPGX2
Influenza A by PCR: NEGATIVE
Influenza B by PCR: NEGATIVE
SARS Coronavirus 2 by RT PCR: NEGATIVE

## 2020-11-08 LAB — CBC WITH DIFFERENTIAL/PLATELET
Abs Immature Granulocytes: 1.12 10*3/uL — ABNORMAL HIGH (ref 0.00–0.07)
Basophils Absolute: 0.1 10*3/uL (ref 0.0–0.1)
Basophils Relative: 1 %
Eosinophils Absolute: 0.1 10*3/uL (ref 0.0–0.5)
Eosinophils Relative: 0 %
HCT: 41.4 % (ref 39.0–52.0)
Hemoglobin: 13.8 g/dL (ref 13.0–17.0)
Immature Granulocytes: 5 %
Lymphocytes Relative: 13 %
Lymphs Abs: 2.8 10*3/uL (ref 0.7–4.0)
MCH: 28 pg (ref 26.0–34.0)
MCHC: 33.3 g/dL (ref 30.0–36.0)
MCV: 84.1 fL (ref 80.0–100.0)
Monocytes Absolute: 2.3 10*3/uL — ABNORMAL HIGH (ref 0.1–1.0)
Monocytes Relative: 10 %
Neutro Abs: 15.7 10*3/uL — ABNORMAL HIGH (ref 1.7–7.7)
Neutrophils Relative %: 71 %
Platelets: 527 10*3/uL — ABNORMAL HIGH (ref 150–400)
RBC: 4.92 MIL/uL (ref 4.22–5.81)
RDW: 13.7 % (ref 11.5–15.5)
WBC: 22 10*3/uL — ABNORMAL HIGH (ref 4.0–10.5)
nRBC: 0 % (ref 0.0–0.2)

## 2020-11-08 LAB — TSH: TSH: 1.211 u[IU]/mL (ref 0.350–4.500)

## 2020-11-08 LAB — HIV ANTIBODY (ROUTINE TESTING W REFLEX): HIV Screen 4th Generation wRfx: NONREACTIVE

## 2020-11-08 LAB — COMPREHENSIVE METABOLIC PANEL
ALT: 89 U/L — ABNORMAL HIGH (ref 0–44)
AST: 59 U/L — ABNORMAL HIGH (ref 15–41)
Albumin: 2.1 g/dL — ABNORMAL LOW (ref 3.5–5.0)
Alkaline Phosphatase: 90 U/L (ref 38–126)
Anion gap: 10 (ref 5–15)
BUN: 9 mg/dL (ref 6–20)
CO2: 24 mmol/L (ref 22–32)
Calcium: 7.9 mg/dL — ABNORMAL LOW (ref 8.9–10.3)
Chloride: 103 mmol/L (ref 98–111)
Creatinine, Ser: 0.66 mg/dL (ref 0.61–1.24)
GFR, Estimated: >60 mL/min (ref >60)
Glucose, Bld: 104 mg/dL — ABNORMAL HIGH (ref 70–99)
Potassium: 4.2 mmol/L (ref 3.5–5.1)
Sodium: 137 mmol/L (ref 135–145)
Total Bilirubin: 2.2 mg/dL — ABNORMAL HIGH (ref 0.3–1.2)
Total Protein: 6.5 g/dL (ref 6.5–8.1)

## 2020-11-08 LAB — C-REACTIVE PROTEIN: CRP: 33.9 mg/dL — ABNORMAL HIGH (ref <1.0)

## 2020-11-08 LAB — URIC ACID: Uric Acid, Serum: 4.4 mg/dL (ref 3.7–8.6)

## 2020-11-08 LAB — SEDIMENTATION RATE: Sed Rate: 45 mm/hr — ABNORMAL HIGH (ref 0–16)

## 2020-11-08 LAB — CK: Total CK: 51 U/L (ref 49–397)

## 2020-11-08 MED ORDER — MORPHINE SULFATE (PF) 4 MG/ML IV SOLN: 4.0000 mg | Freq: Once | INTRAVENOUS | Status: DC

## 2020-11-08 MED ORDER — FENTANYL CITRATE (PF) 100 MCG/2ML IJ SOLN
50.0000 ug | Freq: Once | INTRAMUSCULAR | Status: AC
  Administered 2020-11-08: 50 ug via INTRAVENOUS
  Filled 2020-11-08: qty 2

## 2020-11-08 MED ORDER — KETOROLAC TROMETHAMINE 15 MG/ML IJ SOLN
15.0000 mg | Freq: Once | INTRAMUSCULAR | Status: AC
  Administered 2020-11-08: 15 mg via INTRAMUSCULAR
  Filled 2020-11-08: qty 1

## 2020-11-08 MED ORDER — SODIUM CHLORIDE 0.9 % IV BOLUS
1000.0000 mL | Freq: Once | INTRAVENOUS | Status: AC
  Administered 2020-11-08: 1000 mL via INTRAVENOUS

## 2020-11-08 MED ORDER — METHYLPREDNISOLONE ACETATE 40 MG/ML IJ SUSP
40.0000 mg | Freq: Once | INTRAMUSCULAR | Status: AC
  Administered 2020-11-08: 40 mg via INTRA_ARTICULAR
  Filled 2020-11-08 (×2): qty 1

## 2020-11-08 MED ORDER — SODIUM CHLORIDE 0.9 % IV SOLN: 250.0000 mL | INTRAVENOUS | Status: DC | PRN

## 2020-11-08 MED ORDER — MORPHINE SULFATE (PF) 2 MG/ML IV SOLN
1.0000 mg | INTRAVENOUS | Status: DC | PRN
  Administered 2020-11-08 – 2020-11-10 (×3): 1 mg via INTRAVENOUS
  Filled 2020-11-08 (×3): qty 1

## 2020-11-08 MED ORDER — TRAMADOL HCL 50 MG PO TABS
50.0000 mg | ORAL_TABLET | Freq: Three times a day (TID) | ORAL | Status: DC | PRN
  Administered 2020-11-09 – 2020-11-17 (×6): 50 mg via ORAL
  Filled 2020-11-08 (×7): qty 1

## 2020-11-08 MED ORDER — ENOXAPARIN SODIUM 40 MG/0.4ML IJ SOSY
40.0000 mg | PREFILLED_SYRINGE | INTRAMUSCULAR | Status: DC
  Administered 2020-11-08 – 2020-11-09 (×2): 40 mg via SUBCUTANEOUS
  Filled 2020-11-08 (×2): qty 0.4

## 2020-11-08 MED ORDER — SODIUM CHLORIDE 0.9% FLUSH: 3.0000 mL | INTRAVENOUS | Status: DC | PRN

## 2020-11-08 MED ORDER — ACETAMINOPHEN 325 MG PO TABS
650.0000 mg | ORAL_TABLET | Freq: Four times a day (QID) | ORAL | Status: DC | PRN
  Administered 2020-11-09 – 2020-11-15 (×12): 650 mg via ORAL
  Filled 2020-11-08 (×13): qty 2

## 2020-11-08 MED ORDER — OXYCODONE HCL 5 MG PO TABS
5.0000 mg | ORAL_TABLET | ORAL | Status: DC | PRN
  Administered 2020-11-09 – 2020-11-13 (×15): 5 mg via ORAL
  Filled 2020-11-08 (×16): qty 1

## 2020-11-08 MED ORDER — ACETAMINOPHEN 325 MG PO TABS
650.0000 mg | ORAL_TABLET | Freq: Four times a day (QID) | ORAL | Status: DC | PRN
  Administered 2020-11-08: 650 mg via ORAL
  Filled 2020-11-08: qty 2

## 2020-11-08 MED ORDER — BUPIVACAINE HCL (PF) 0.5 % IJ SOLN
10.0000 mL | Freq: Once | INTRAMUSCULAR | Status: AC
  Administered 2020-11-08: 10 mL
  Filled 2020-11-08: qty 10

## 2020-11-08 MED ORDER — SODIUM CHLORIDE 0.9% FLUSH
3.0000 mL | Freq: Two times a day (BID) | INTRAVENOUS | Status: DC
  Administered 2020-11-08 – 2020-11-18 (×17): 3 mL via INTRAVENOUS

## 2020-11-08 MED ORDER — SENNOSIDES-DOCUSATE SODIUM 8.6-50 MG PO TABS: 1.0000 | ORAL_TABLET | Freq: Every evening | ORAL | Status: DC | PRN

## 2020-11-08 MED ORDER — ACETAMINOPHEN 500 MG PO TABS
1000.0000 mg | ORAL_TABLET | Freq: Once | ORAL | Status: AC
  Administered 2020-11-08: 1000 mg via ORAL
  Filled 2020-11-08: qty 2

## 2020-11-08 MED ORDER — MORPHINE SULFATE (PF) 4 MG/ML IV SOLN
4.0000 mg | Freq: Once | INTRAVENOUS | Status: AC
  Administered 2020-11-08: 4 mg via INTRAVENOUS
  Filled 2020-11-08: qty 1

## 2020-11-08 MED ORDER — SODIUM CHLORIDE 0.9 % IV SOLN
1.0000 g | INTRAVENOUS | Status: DC
  Administered 2020-11-08 – 2020-11-13 (×6): 1 g via INTRAVENOUS
  Filled 2020-11-08 (×2): qty 10
  Filled 2020-11-08: qty 1
  Filled 2020-11-08: qty 10
  Filled 2020-11-08 (×3): qty 1

## 2020-11-08 MED ORDER — OXYCODONE HCL 5 MG PO TABS
10.0000 mg | ORAL_TABLET | Freq: Once | ORAL | Status: AC
  Administered 2020-11-08: 10 mg via ORAL
  Filled 2020-11-08: qty 2

## 2020-11-08 MED ORDER — DICLOFENAC SODIUM 1 % EX GEL
2.0000 g | Freq: Four times a day (QID) | CUTANEOUS | Status: DC
  Administered 2020-11-08 – 2020-11-19 (×37): 2 g via TOPICAL
  Filled 2020-11-08 (×3): qty 100

## 2020-11-08 NOTE — ED Provider Notes (Signed)
[facility] EMERGENCY DEPARTMENT Provider Note   CSN: [id number] Arrival date & time: 11/08/20  7829     History Chief Complaint  Patient presents with   Leg Pain   Hand Pain   Flank Pain    Juan Tanner is a 55 y.o. male.   Leg Pain Associated symptoms: no back pain and no fever   Hand Pain Pertinent negatives include no chest pain, no abdominal pain and no shortness of breath.  Flank Pain Pertinent negatives include no chest pain, no abdominal pain and no shortness of breath.    55 yo M with no pertinent PMHx presenting to the ED with right knee pain and left wrist pain. Patient reports that his wife made a fish containing meal around 6 days ago. He states the next day, he developed significant pain and swelling in his left wrist and right knee. He states that he has been seen in multiple care settings since that time and has been told he has gout. He denies any draining of fluid from his joints. He states he has been on steroids, indomethacin, and colchicine without any improvement in his symptoms. He states that the left wrist pain and swelling are largely stable, but the pain in his right knee is worsening. The swelling is now involving his entire right lower leg. He denies any fevers or weight loss. He denies any history of similar symptoms in the past.   Past Medical History:  Diagnosis Date   Blood in stool    GERD (gastroesophageal reflux disease)    Premature ventricular contraction    outflow tract pvcs    Patient Active Problem List   Diagnosis Date Noted   Cellulitis of right lower extremity 11/09/2020   Cellulitis of right lower leg 11/08/2020   Effusion of right knee 11/08/2020   Joint pain 11/08/2020   Transaminitis 11/08/2020   Leukocytosis 11/08/2020   Acute gout of left wrist 11/04/2020   Acute pain of left knee 11/04/2020   Premature ventricular contractions 06/06/2012   GERD 02/16/2008    Past Surgical History:  Procedure  Laterality Date   none         Family History  Problem Relation Age of Onset   Heart attack Neg Hx     Social History   Tobacco Use   Smoking status: Never   Smokeless tobacco: Never  Vaping Use   Vaping Use: Never used  Substance Use Topics   Alcohol use: No   Drug use: No    Home Medications Prior to Admission medications   Medication Sig Start Date End Date Taking? Authorizing Provider  HYDROcodone-acetaminophen (NORCO/VICODIN) 5-325 MG tablet Take 1 tablet by mouth every 6 (six) hours as needed for moderate pain.   Yes [provider]  indomethacin (INDOCIN) 25 MG capsule Take 1 capsule (25 mg total) by mouth 3 (three) times daily as needed. Patient taking differently: Take 25 mg by mouth 3 (three) times daily as needed for mild pain. 11/03/20  Yes Mancel Bale, MD  acetaminophen (TYLENOL) 500 MG tablet Take 1,000 mg by mouth every 6 (six) hours as needed for mild pain, moderate pain or headache. Patient not taking: Reported on 11/08/2020    [provider]  lidocaine (LIDODERM) 5 % Place 1 patch onto the skin daily. Remove & Discard patch within 12 hours or as directed by MD Patient not taking: No sig reported 11/03/20   Mancel Bale, MD  pantoprazole (PROTONIX) 20 MG tablet Take  1 tablet (20 mg total) by mouth daily. Patient not taking: No sig reported 08/24/20   Petrucelli, Pleas Koch, PA-C    Allergies    Patient has no known allergies.  Review of Systems   Review of Systems  Constitutional:  Negative for chills and fever.  HENT:  Negative for ear pain and sore throat.   Eyes:  Negative for pain and visual disturbance.  Respiratory:  Negative for cough and shortness of breath.   Cardiovascular:  Negative for chest pain and palpitations.  Gastrointestinal:  Negative for abdominal pain and vomiting.  Genitourinary:  Negative for dysuria, flank pain and hematuria.  Musculoskeletal:  Positive for arthralgias and joint swelling. Negative for back  pain.  Skin:  Negative for color change and rash.  Neurological:  Negative for seizures and syncope.  All other systems reviewed and are negative.  Physical Exam Updated Vital Signs BP (!) 157/98 (BP Location: Right Arm)   Pulse (!) 106   Temp 98.2 F (36.8 C) (Oral)   Resp 16   Ht 5\' 5"  (1.651 m)   Wt 82.8 kg   SpO2 96%   BMI 30.38 kg/m   Physical Exam Vitals and nursing note reviewed.  Constitutional:      General: He is not in acute distress.    Appearance: He is well-developed and normal weight. He is not ill-appearing or toxic-appearing.     Comments: Uncomfortable appearing  HENT:     Head: Normocephalic and atraumatic.  Eyes:     Conjunctiva/sclera: Conjunctivae normal.  Cardiovascular:     Rate and Rhythm: Normal rate and regular rhythm.     Heart sounds: No murmur heard. Pulmonary:     Effort: Pulmonary effort is normal. No respiratory distress.     Breath sounds: Normal breath sounds.  Abdominal:     Palpations: Abdomen is soft.     Tenderness: There is no abdominal tenderness.  Musculoskeletal:        General: Swelling and tenderness present.     Cervical back: Neck supple.     Comments: Mild left wrist swelling, but diffuse left hand swelling. ROM in the hand joints limited due to pain and swelling. 2+ radial pulse. No redness.  Right knee with mild suprapatellar swelling, minimal effusion. Significant swelling distal to the right knee of the right lower leg diffusely and the right foot. It is warm, no significant erythema. No wounds. 2+ DP pulse.   Skin:    General: Skin is warm and dry.     Capillary Refill: Capillary refill takes less than 2 seconds.  Neurological:     Mental Status: He is alert and oriented to person, place, and time.    ED Results / Procedures / Treatments   Labs (all labs ordered are listed, but only abnormal results are displayed) Labs Reviewed  CBC WITH DIFFERENTIAL/PLATELET - Abnormal; Notable for the following components:       Result Value   WBC 22.0 (*)    Platelets 527 (*)    Neutro Abs 15.7 (*)    Monocytes Absolute 2.3 (*)    Abs Immature Granulocytes 1.12 (*)    All other components within normal limits  SEDIMENTATION RATE - Abnormal; Notable for the following components:   Sed Rate 45 (*)    All other components within normal limits  C-REACTIVE PROTEIN - Abnormal; Notable for the following components:   CRP 33.9 (*)    All other components within normal limits  COMPREHENSIVE METABOLIC PANEL -  Abnormal; Notable for the following components:   Glucose, Bld 104 (*)    Calcium 7.9 (*)    Albumin 2.1 (*)    AST 59 (*)    ALT 89 (*)    Total Bilirubin 2.2 (*)    All other components within normal limits  SYNOVIAL CELL COUNT + DIFF, W/ CRYSTALS - Abnormal; Notable for the following components:   Appearance-Synovial CLOUDY (*)    WBC, Synovial 15,350 (*)    Neutrophil, Synovial 93 (*)    Monocyte-Macrophage-Synovial Fluid 4 (*)    All other components within normal limits  RHEUMATOID FACTOR - Abnormal; Notable for the following components:   Rhuematoid fact SerPl-aCnc 30.4 (*)    All other components within normal limits  COMPREHENSIVE METABOLIC PANEL - Abnormal; Notable for the following components:   Sodium 131 (*)    Glucose, Bld 143 (*)    Calcium 8.5 (*)    Albumin 2.2 (*)    ALT 72 (*)    All other components within normal limits  CBC - Abnormal; Notable for the following components:   WBC 26.7 (*)    Hemoglobin 12.8 (*)    HCT 38.9 (*)    Platelets 543 (*)    All other components within normal limits  BODY FLUID CULTURE W GRAM STAIN  RESP PANEL BY RT-PCR (FLU A&B, COVID) ARPGX2  CULTURE, BLOOD (ROUTINE X 2)  CULTURE, BLOOD (ROUTINE X 2)  CK  URIC ACID  TSH  HIV ANTIBODY (ROUTINE TESTING W REFLEX)  HEPATITIS PANEL, ACUTE  ANA W/REFLEX IF POSITIVE  CYCLIC CITRUL PEPTIDE ANTIBODY, IGG/IGA  URINE CYTOLOGY ANCILLARY ONLY    EKG None  Radiology DG Knee 2 Views Right  Result  Date: 11/08/2020 CLINICAL DATA:  Evaluate for effusion. EXAM: RIGHT KNEE - 1-2 VIEW COMPARISON:  None. FINDINGS: There is a small suprapatellar joint effusion. No acute fracture or dislocation. No radio-opaque foreign bodies are soft tissue calcifications. IMPRESSION: Small suprapatellar joint effusion. Electronically Signed   By: Signa Kell M.D.   On: 11/08/2020 11:09   VAS [place] LOWER EXTREMITY VENOUS (DVT) (ONLY [facility] & [facility] 7a-7p)  Result Date: 11/08/2020  Lower Venous DVT Study Patient Name:  FENDER HERDER  Date of Exam:   11/08/2020 Medical Rec #: [id number]       Accession #:    [id number] Date of Birth: 06-Sep-1965        Patient Gender: M Patient Age:   43 years Exam Location:  [facility] Procedure:      VAS [place] LOWER EXTREMITY VENOUS (DVT) Referring Phys: Benjiman Core --------------------------------------------------------------------------------  Indications: Pain in RT foot, wound on lateral aspect of foot.  Comparison Study: No prior studies. Performing Technologist: Jean Rosenthal RDMS,RVT  Examination Guidelines: A complete evaluation includes B-mode imaging, spectral Doppler, color Doppler, and power Doppler as needed of all accessible portions of each vessel. Bilateral testing is considered an integral part of a complete examination. Limited examinations for reoccurring indications may be performed as noted. The reflux portion of the exam is performed with the patient in reverse Trendelenburg.  +---------+---------------+---------+-----------+----------+--------------+ RIGHT    CompressibilityPhasicitySpontaneityPropertiesThrombus Aging +---------+---------------+---------+-----------+----------+--------------+ CFV      Full           Yes      Yes                                 +---------+---------------+---------+-----------+----------+--------------+ SFJ  Full                                                         +---------+---------------+---------+-----------+----------+--------------+ FV Prox  Full                                                        +---------+---------------+---------+-----------+----------+--------------+ FV Mid   Full                                                        +---------+---------------+---------+-----------+----------+--------------+ FV DistalFull                                                        +---------+---------------+---------+-----------+----------+--------------+ PFV      Full                                                        +---------+---------------+---------+-----------+----------+--------------+ POP      Full           Yes      Yes                                 +---------+---------------+---------+-----------+----------+--------------+ PTV      Full                                                        +---------+---------------+---------+-----------+----------+--------------+ PERO     Full                                                        +---------+---------------+---------+-----------+----------+--------------+ Gastroc  Full                                                        +---------+---------------+---------+-----------+----------+--------------+   +----+---------------+---------+-----------+----------+--------------+ LEFTCompressibilityPhasicitySpontaneityPropertiesThrombus Aging +----+---------------+---------+-----------+----------+--------------+ CFV Full           Yes      Yes                                 +----+---------------+---------+-----------+----------+--------------+  Summary: RIGHT: - There is no evidence of deep vein thrombosis in the lower extremity.  - No cystic structure found in the popliteal fossa.  LEFT: - No evidence of common femoral vein obstruction.  *See table(s) above for measurements and observations. Electronically signed by Heath Lark on  11/08/2020 at 5:29:17 PM.    Final     Procedures Procedures   Medications Ordered in ED Medications  morphine 4 MG/ML injection 4 mg (4 mg Intravenous Not Given 11/08/20 1355)  enoxaparin (LOVENOX) injection 40 mg (40 mg Subcutaneous Given 11/08/20 1839)  sodium chloride flush (NS) 0.9 % injection 3 mL (3 mLs Intravenous Given 11/09/20 1006)  sodium chloride flush (NS) 0.9 % injection 3 mL (has no administration in time range)  0.9 %  sodium chloride infusion (has no administration in time range)  traMADol (ULTRAM) tablet 50 mg (has no administration in time range)  oxyCODONE (Oxy IR/ROXICODONE) immediate release tablet 5 mg (5 mg Oral Given 11/09/20 0639)  morphine 2 MG/ML injection 1 mg (1 mg Intravenous Given 11/09/20 0323)  senna-docusate (Senokot-S) tablet 1 tablet (has no administration in time range)  cefTRIAXone (ROCEPHIN) 1 g in sodium chloride 0.9 % 100 mL IVPB (1 g Intravenous New Bag/Given 11/08/20 1839)  acetaminophen (TYLENOL) tablet 650 mg (650 mg Oral Given 11/09/20 0833)  diclofenac Sodium (VOLTAREN) 1 % topical gel 2 g (2 g Topical Given 11/09/20 1442)  0.9 %  sodium chloride infusion ( Intravenous New Bag/Given 11/09/20 1444)  ketorolac (TORADOL) 15 MG/ML injection 15 mg (15 mg Intramuscular Given 11/08/20 1052)  oxyCODONE (Oxy IR/ROXICODONE) immediate release tablet 10 mg (10 mg Oral Given 11/08/20 1053)  acetaminophen (TYLENOL) tablet 1,000 mg (1,000 mg Oral Given 11/08/20 1053)  fentaNYL (SUBLIMAZE) injection 50 mcg (50 mcg Intravenous Given 11/08/20 1052)  sodium chloride 0.9 % bolus 1,000 mL (0 mLs Intravenous Stopped 11/08/20 1200)  morphine 4 MG/ML injection 4 mg (4 mg Intravenous Given 11/08/20 1355)  bupivacaine (MARCAINE) 0.5 % injection 10 mL (10 mLs Infiltration Given 11/08/20 1500)  methylPREDNISolone acetate (DEPO-MEDROL) injection 40 mg (40 mg Intra-articular Given 11/08/20 1500)  sodium chloride 0.9 % bolus 500 mL (500 mLs Intravenous New Bag/Given 11/09/20 0058)    ED Course  I have  reviewed the triage vital signs and the nursing notes.  Pertinent labs & imaging results that were available during my care of the patient were reviewed by me and considered in my medical decision making (see chart for details).    MDM Rules/Calculators/A&P                           55 yo M with polyarthritis x6 days after eating fish. DDX includes gout, septic arthritis, RA, fracture. Patient denies any trauma. Physical exam notable for swelling to left wrist and minimal right knee effusion, but marked swelling distal to right knee. Patient has been seen 5 times for these symptoms and empirically diagnosed with gout, although I see no arthrocentesis results documented. Empirici gout therapies have provided no relief and patient's pain is worsening. He has a leukocytosis and elevated inflammatory markers on previous blood work. Given his swelling of his right leg distal to this right knee out of proportion to his right knee swelling, will obtain DVT study and repeat labs. Will consult Ortho for further eval and recommendations, as patient's presentation is not entirely consistent with gout. Seems less likely to be septic arthritis given multiple joints involved, no fever despite  duration of symptoms.   DVT study negative. Worsening leukocytosis of 22, but improving inflammatory markers. Ortho performed arthrocentesis at bedside, but even if it returns positive for crystal, patient will need admission for pain control as he has failed outpatient management despite adequate regimens and adherence at home. Hospitalist consulted for admission. Hand off given, they will follow up results of knee tap.  Final Clinical Impression(s) / ED Diagnoses Final diagnoses:  Right leg pain   Rx / DC Orders ED Discharge Orders     None        Lenard Lance, MD 11/09/20 1610    Benjiman Core, MD 11/10/20 [phone]

## 2020-11-08 NOTE — ED Notes (Signed)
Pt up to [facility] w wife

## 2020-11-08 NOTE — Consult Note (Signed)
Reason for Consult:Right knee pain Referring Physician: Benjiman Core Time called: 1350 Time at bedside: 1355   Juan Tanner is an 55 y.o. male.  HPI: [name] comes in with a 1 week hx/o right knee pain. He says it began after eating some fish. The pain has been severe and progressive and associated with swelling. He's still able to ambulate but with great difficulty. He denies prior hx/o similar. He has experienced subjective fevers but no chills, sweats, or N/V. He works as a Naval [employer].  Past Medical History:  Diagnosis Date   Blood in stool    GERD (gastroesophageal reflux disease)    Premature ventricular contraction    outflow tract pvcs    Past Surgical History:  Procedure Laterality Date   none      Family History  Problem Relation Age of Onset   Heart attack Neg Hx     Social History:  reports that he has never smoked. He has never used smokeless tobacco. He reports that he does not drink alcohol and does not use drugs.  Allergies: No Known Allergies  Medications: I have reviewed the patient's current medications.  Results for orders placed or performed during the hospital encounter of 11/08/20 (from the past 48 hour(s))  CBC with Differential     Status: Abnormal   Collection Time: 11/08/20 10:05 AM  Result Value Ref Range   WBC 22.0 (H) 4.0 - 10.5 K/uL   RBC 4.92 4.22 - 5.81 MIL/uL   Hemoglobin 13.8 13.0 - 17.0 g/dL   HCT 26.9 48.5 - 46.2 %   MCV 84.1 80.0 - 100.0 fL   MCH 28.0 26.0 - 34.0 pg   MCHC 33.3 30.0 - 36.0 g/dL   RDW 70.3 50.0 - 93.8 %   Platelets 527 (H) 150 - 400 K/uL   nRBC 0.0 0.0 - 0.2 %   Neutrophils Relative % 71 %   Neutro Abs 15.7 (H) 1.7 - 7.7 K/uL   Lymphocytes Relative 13 %   Lymphs Abs 2.8 0.7 - 4.0 K/uL   Monocytes Relative 10 %   Monocytes Absolute 2.3 (H) 0.1 - 1.0 K/uL   Eosinophils Relative 0 %   Eosinophils Absolute 0.1 0.0 - 0.5 K/uL   Basophils Relative 1 %   Basophils Absolute 0.1 0.0 - 0.1 K/uL   Immature  Granulocytes 5 %   Abs Immature Granulocytes 1.12 (H) 0.00 - 0.07 K/uL    Comment: Performed at [facility], [address] [place], [place] [address]  Sedimentation rate     Status: Abnormal   Collection Time: 11/08/20 10:05 AM  Result Value Ref Range   Sed Rate 45 (H) 0 - 16 mm/hr    Comment: Performed at [facility], [address], [place], [place] [address]  C-reactive protein     Status: Abnormal   Collection Time: 11/08/20 10:05 AM  Result Value Ref Range   CRP 33.9 (H) <1.0 mg/dL    Comment: Performed at [facility], [address], [place], [place] [address]  CK     Status: None   Collection Time: 11/08/20 12:30 PM  Result Value Ref Range   Total CK 51 49 - 397 U/L    Comment: Performed at [facility], [address] [place], [place] [address]  Comprehensive metabolic panel     Status: Abnormal   Collection Time: 11/08/20 12:30 PM  Result Value Ref Range   Sodium 137 135 - 145  mmol/L   Potassium 4.2 3.5 - 5.1 mmol/L   Chloride 103 98 - 111 mmol/L   CO2 24 22 - 32 mmol/L   Glucose, Bld 104 (H) 70 - 99 mg/dL    Comment: Glucose reference range applies only to samples taken after fasting for at least 8 hours.   BUN 9 6 - 20 mg/dL   Creatinine, Ser 1.61 0.61 - 1.24 mg/dL   Calcium 7.9 (L) 8.9 - 10.3 mg/dL   Total Protein 6.5 6.5 - 8.1 g/dL   Albumin 2.1 (L) 3.5 - 5.0 g/dL   AST 59 (H) 15 - 41 U/L   ALT 89 (H) 0 - 44 U/L   Alkaline Phosphatase 90 38 - 126 U/L   Total Bilirubin 2.2 (H) 0.3 - 1.2 mg/dL   GFR, Estimated >09 >60 mL/min    Comment: (NOTE) Calculated using the CKD-EPI Creatinine Equation (2021)    Anion gap 10 5 - 15    Comment: Performed at [facility], [address], [place], [place] [address]    DG Knee 2 Views Right  Result Date: 11/08/2020 CLINICAL DATA:  Evaluate for effusion. EXAM: RIGHT KNEE - 1-2 VIEW COMPARISON:  None. FINDINGS: There is a small suprapatellar joint effusion. No acute fracture or dislocation.  No radio-opaque foreign bodies are soft tissue calcifications. IMPRESSION: Small suprapatellar joint effusion. Electronically Signed   By: Signa Kell M.D.   On: 11/08/2020 11:09   VAS [place] LOWER EXTREMITY VENOUS (DVT) (ONLY [facility] & [facility] 7a-7p)  Result Date: 11/08/2020  Lower Venous DVT Study Patient Name:  Juan Tanner  Date of Exam:   11/08/2020 Medical Rec #: [id number]       Accession #:    [id number] Date of Birth: 1965-06-11        Patient Gender: M Patient Age:   59 years Exam Location:  [facility] Procedure:      VAS [place] LOWER EXTREMITY VENOUS (DVT) Referring Phys: Benjiman Core --------------------------------------------------------------------------------  Indications: Pain in RT foot, wound on lateral aspect of foot.  Comparison Study: No prior studies. Performing Technologist: Jean Rosenthal RDMS,RVT  Examination Guidelines: A complete evaluation includes B-mode imaging, spectral Doppler, color Doppler, and power Doppler as needed of all accessible portions of each vessel. Bilateral testing is considered an integral part of a complete examination. Limited examinations for reoccurring indications may be performed as noted. The reflux portion of the exam is performed with the patient in reverse Trendelenburg.  +---------+---------------+---------+-----------+----------+--------------+ RIGHT    CompressibilityPhasicitySpontaneityPropertiesThrombus Aging +---------+---------------+---------+-----------+----------+--------------+ CFV      Full           Yes      Yes                                 +---------+---------------+---------+-----------+----------+--------------+ SFJ      Full                                                        +---------+---------------+---------+-----------+----------+--------------+ FV Prox  Full                                                        +---------+---------------+---------+-----------+----------+--------------+  FV Mid   Full                                                         +---------+---------------+---------+-----------+----------+--------------+ FV DistalFull                                                        +---------+---------------+---------+-----------+----------+--------------+ PFV      Full                                                        +---------+---------------+---------+-----------+----------+--------------+ POP      Full           Yes      Yes                                 +---------+---------------+---------+-----------+----------+--------------+ PTV      Full                                                        +---------+---------------+---------+-----------+----------+--------------+ PERO     Full                                                        +---------+---------------+---------+-----------+----------+--------------+ Gastroc  Full                                                        +---------+---------------+---------+-----------+----------+--------------+   +----+---------------+---------+-----------+----------+--------------+ LEFTCompressibilityPhasicitySpontaneityPropertiesThrombus Aging +----+---------------+---------+-----------+----------+--------------+ CFV Full           Yes      Yes                                 +----+---------------+---------+-----------+----------+--------------+     Summary: RIGHT: - There is no evidence of deep vein thrombosis in the lower extremity.  - No cystic structure found in the popliteal fossa.  LEFT: - No evidence of common femoral vein obstruction.  *See table(s) above for measurements and observations.    Preliminary     Review of Systems  Constitutional:  Positive for fever. Negative for chills and diaphoresis.  HENT:  Negative for ear discharge, ear pain, hearing loss and tinnitus.   Eyes:  Negative for photophobia and pain.  Respiratory:  Negative for cough and shortness of  breath.   Cardiovascular:  Negative for chest pain.  Gastrointestinal:  Negative for abdominal pain, nausea  and vomiting.  Genitourinary:  Negative for dysuria, flank pain, frequency and urgency.  Musculoskeletal:  Positive for arthralgias (Right knee, left wrist). Negative for back pain, myalgias and neck pain.  Neurological:  Negative for dizziness and headaches.  Hematological:  Does not bruise/bleed easily.  Psychiatric/Behavioral:  The patient is not nervous/anxious.   Blood pressure (!) 130/101, pulse (!) 105, temperature 99 F (37.2 C), temperature source Oral, resp. rate 15, height 5\' 5"  (1.651 m), weight 83 kg, SpO2 97 %. Physical Exam Constitutional:      General: He is not in acute distress.    Appearance: He is well-developed. He is not diaphoretic.  HENT:     Head: Normocephalic and atraumatic.  Eyes:     General: No scleral icterus.       Right eye: No discharge.        Left eye: No discharge.     Conjunctiva/sclera: Conjunctivae normal.  Cardiovascular:     Rate and Rhythm: Normal rate and regular rhythm.  Pulmonary:     Effort: Pulmonary effort is normal. No respiratory distress.  Musculoskeletal:     Cervical back: Normal range of motion.     Comments: LLE No traumatic wounds, ecchymosis, or rash  Severe TTP knee, AROM 180-135, PROM 180-85  Mod knee effusion  Sens DPN, SPN, TN intact  Motor EHL, ext, flex, evers 5/5  DP 2+, PT 0, No significant edema  Skin:    General: Skin is warm and dry.  Neurological:     Mental Status: He is alert.  Psychiatric:        Mood and Affect: Mood normal.        Behavior: Behavior normal.    Assessment/Plan: Right knee pain -- I suspect this is gout. Will tap knee to try and establish diagnosis. Septic arthritis seems much less likely given movement of joint. RA is also a possibility given polyarthralgia.     , PA-C Orthopedic Surgery [phone] 11/08/2020, 2:14 PM

## 2020-11-08 NOTE — H&P (Addendum)
History and Physical    Juan Tanner PIR:[id number] DOB: [birth year] DOA: 11/08/2020  PCP: Pcp, No Consultants:  none Patient coming from:  Home - lives with  Chief Complaint: right knee pain.   HPI: Juan Tanner is a 55 y.o. male with medical history significant of GERD, gout who presented to ER with right knee pain. Has been seen numerous time and was told he had gout. He has been on indomethacin, colchicine, oxycodone with no relief. He tells me about one week ago his wife made catfish. He ate it and then took leftovers to work the next day.  After he ate it he started to have itching and bumps on his neck and then pain in his left wrist, left knee and subsequently his right  knee. His right knee continued to swell and become so painful he can hardly walk on it, but is able to ambulate.  The swelling, pain and redness extends into his calf. He denies any trauma to the knee/lower leg, bites/open wounds. He does not drink or smoke. In monogamous relationship with his wife. Denis any headaches, vision changes, chest pain, palpitations, no recorded fevers, but has felt hot at home, no chills, no shortness of breath, no stomach pain, no N/V/D. No rashes.    ED Course: vitals: afebrile, bp: 153/102 HR: 88, RR: 18, oxgyen: 95% room air. Pertinent labs: WBC: 22, platelets: 527, ESR: 45, CRP: 33.9, AST: 59, ALT: 89 Knee xray: small suprapatellar joint effusion. Ortho consulted and right knee aspiration and injection performed. We were asked to admit.   Review of Systems: As per HPI; otherwise review of systems reviewed and negative.   Ambulatory Status:  Ambulates without assistance    Past Medical History:  Diagnosis Date   Blood in stool    GERD (gastroesophageal reflux disease)    Premature ventricular contraction    outflow tract pvcs    Past Surgical History:  Procedure Laterality Date   none      Social History   Socioeconomic History   Marital status: Married    Spouse name:  Not on file   Number of children: 5   Years of education: Not on file   Highest education level: Not on file  Occupational History   Not on file  Tobacco Use   Smoking status: Never   Smokeless tobacco: Never  Vaping Use   Vaping Use: Never used  Substance and Sexual Activity   Alcohol use: No   Drug use: No   Sexual activity: Yes    Partners: Female  Other Topics Concern   Not on file  Social History Narrative   Pt is from [place], moved here in 1998.    Lives in [place] with spouse and children.   Works in [place] Strain: Not on file  Food Insecurity: Not on file  Transportation Needs: Not on file  Physical Activity: Not on file  Stress: Not on file  Social Connections: Not on file  Intimate Partner Violence: Not on file    No Known Allergies  Family History  Problem Relation Age of Onset   Heart attack Neg Hx     Prior to Admission medications   Medication Sig Start Date End Date Taking? Authorizing Provider  acetaminophen (TYLENOL) 500 MG tablet Take 1,000 mg by mouth every 6 (six) hours as needed for mild pain, moderate pain or headache.    [provider]  indomethacin (  INDOCIN) 25 MG capsule Take 1 capsule (25 mg total) by mouth 3 (three) times daily as needed. 11/03/20   Daleen Bo, MD  lidocaine (LIDODERM) 5 % Place 1 patch onto the skin daily. Remove & Discard patch within 12 hours or as directed by MD Patient not taking: Reported on 11/04/2020 11/03/20   Daleen Bo, MD  pantoprazole (PROTONIX) 20 MG tablet Take 1 tablet (20 mg total) by mouth daily. Patient not taking: Reported on 11/04/2020 08/24/20   Amaryllis Dyke, PA-C    Physical Exam: Vitals:   11/08/20 1356 11/08/20 1527 11/08/20 1840 11/08/20 1949  BP: (!) 130/101 (!) 128/95 125/85 (!) 150/101  Pulse: (!) 105 100 98   Resp: _0 Temp:   98 F (36.7 C) (!) 100.6 F (38.1 C)  TempSrc:   Oral  Oral  SpO2: 97% 98% 99% 96%  Weight:    82.8 kg  Height:    _1  (1.651 m)     General:  Appears calm and comfortable and is in NAD Eyes:  PERRL, EOMI, normal lids, iris ENT:  grossly normal hearing, lips & tongue, mmm; appropriate dentition Neck:  no LAD, masses or thyromegaly; no carotid bruits Cardiovascular:  RRR, no m/r/g. No LE edema.  Respiratory:   CTA bilaterally with no wheezes/rales/rhonchi.  Normal respiratory effort. Abdomen:  soft, NT, ND, NABS Back:   normal alignment, no CVAT Skin:  no rash or induration seen on limited exam Musculoskeletal:  right knee: TTP over medial and lateral aspects of knee with mild edema in joint and warmness. No point TTP over patella.  Distal to knee and to mid calf he has impressive edema and erythema and warmth to touch. He can move the knee with extension and flexion. Can not flex past 90 degrees due to pain and swelling. grossly normal tone BUE/BLE, good ROM, no bony abnormality. Left knee: mild TTP  over medial aspect of joint. Full extension and flexion.  Lower extremity:  No LE edema.  Limited foot exam with no ulcerations.  2+ distal pulses. Psychiatric:  grossly normal mood and affect, speech fluent and appropriate, AOx3 Neurologic:  CN 2-12 grossly intact, moves all extremities in coordinated fashion, sensation intact    Radiological Exams on Admission: Independently reviewed - see discussion in A/P where applicable  DG Knee 2 Views Right  Result Date: 11/08/2020 CLINICAL DATA:  Evaluate for effusion. EXAM: RIGHT KNEE - 1-2 VIEW COMPARISON:  None. FINDINGS: There is a small suprapatellar joint effusion. No acute fracture or dislocation. No radio-opaque foreign bodies are soft tissue calcifications. IMPRESSION: Small suprapatellar joint effusion. Electronically Signed   By: Kerby Moors M.D.   On: 11/08/2020 11:09   VAS [place] LOWER EXTREMITY VENOUS (DVT) (ONLY [facility] & [facility] 7a-7p)  Result Date: 11/08/2020  Lower Venous DVT Study Patient  Name:  Juan Tanner  Date of Exam:   11/08/2020 Medical Rec #: [id number]       Accession #:    [id number] Date of Birth: [birth year]        Patient Gender: M Patient Age:   61 years Exam Location:  [facility] Procedure:      VAS [place] LOWER EXTREMITY VENOUS (DVT) Referring Phys: Davonna Belling --------------------------------------------------------------------------------  Indications: Pain in RT foot, wound on lateral aspect of foot.  Comparison Study: No prior studies. Performing Technologist: Darlin Coco RDMS,RVT  Examination Guidelines: A complete evaluation includes B-mode imaging, spectral Doppler, color Doppler, and power Doppler as needed of  all accessible portions of each vessel. Bilateral testing is considered an integral part of a complete examination. Limited examinations for reoccurring indications may be performed as noted. The reflux portion of the exam is performed with the patient in reverse Trendelenburg.  +---------+---------------+---------+-----------+----------+--------------+ RIGHT    CompressibilityPhasicitySpontaneityPropertiesThrombus Aging +---------+---------------+---------+-----------+----------+--------------+ CFV      Full           Yes      Yes                                 +---------+---------------+---------+-----------+----------+--------------+ SFJ      Full                                                        +---------+---------------+---------+-----------+----------+--------------+ FV Prox  Full                                                        +---------+---------------+---------+-----------+----------+--------------+ FV Mid   Full                                                        +---------+---------------+---------+-----------+----------+--------------+ FV DistalFull                                                        +---------+---------------+---------+-----------+----------+--------------+ PFV      Full                                                         +---------+---------------+---------+-----------+----------+--------------+ POP      Full           Yes      Yes                                 +---------+---------------+---------+-----------+----------+--------------+ PTV      Full                                                        +---------+---------------+---------+-----------+----------+--------------+ PERO     Full                                                        +---------+---------------+---------+-----------+----------+--------------+ Gastroc  Full                                                        +---------+---------------+---------+-----------+----------+--------------+   +----+---------------+---------+-----------+----------+--------------+  LEFTCompressibilityPhasicitySpontaneityPropertiesThrombus Aging +----+---------------+---------+-----------+----------+--------------+ CFV Full           Yes      Yes                                 +----+---------------+---------+-----------+----------+--------------+     Summary: RIGHT: - There is no evidence of deep vein thrombosis in the lower extremity.  - No cystic structure found in the popliteal fossa.  LEFT: - No evidence of common femoral vein obstruction.  *See table(s) above for measurements and observations. Electronically signed by Jamelle Haring on 11/08/2020 at 5:29:17 PM.    Final      Labs on Admission: I have personally reviewed the available labs and imaging studies at the time of the admission.  Pertinent labs:  WBC: 22,  platelets: 527,  ESR: 45,  CRP: 33.9,  AST: 59,  ALT: 89 Knee xray: small suprapatellar joint effusion Right doppler: negative for DVT.    Assessment/Plan Principal Problem:   Cellulitis of right lower leg -no known precipitating event, but with leukocytosis and clinical findings, consistent with cellulitis -started on rocephin for moderate  cellulitis per guidelines -doppler negative for DVT -blood cultures pending  -pain control  Active Problems:   Effusion of right knee Ortho following and aspiration done with studies pending. Low clinical suspicion of septic arthritis.  Uric acid pending for gout as well as RF and ANA  -GC/C is pending -f/u on studies -pain control     Joint pain -uric acid, RF pending -has joint pain in various joints with edema, concern for more RA picture -pain has been uncontrolled with oral drugs including oxycodone. -pain control with tramadol, oxycodone and morphine for severe pain.  -avoiding tylenol with transaminitis   -voltaren gel PRN     Leukocytosis ? If secondary to cellulitis vs. Joint vs. Reactive -started on rocephin for cellulitis -ortho following but low clinical suspicion for septic joint -no other obvious source of infection -continue to trend     Transaminitis  -has not had any tylenol and does not drink -hepatitis panel pending -abdominal US if does not trend downward  Elevated blood pressure -no prior history, he is in pain -continue to monitor and treat if indicated  Body mass index is 30.38 kg/m.   Level of care: Med-Surg DVT prophylaxis:  Lovenox  Code Status:  Full - confirmed with patient Family Communication: None present Disposition Plan:  The patient is from: home  Anticipated d/c is to: home   Anticipated d/c date will depend on clinical response to treatment, but possibly as early as tomorrow if he has excellent response to treatment Requires inpatient hospitalization for pain control and is at significant risk of worsening infection, requires constant monitoring, assessment and MDM with specialists.    Consults called: ortho by EDP   Admission status:  observation    Orma Flaming MD [facility]   How to contact the [facility] Attending or Consulting provider [place] or covering provider during after hours [place], for this patient?  Check  the care team in [facility] and look for a) attending/consulting [facility] provider listed and b) the [facility] team listed Log into [email or web address] and use [place] universal password to access. If you do not have the password, please contact the hospital operator. Locate the [facility] provider you are looking for under [facility] and page to a number that you can be directly reached. If you  still have difficulty reaching the provider, please page the [facility] (Director on Call) for the Hospitalists listed on amion for assistance.   11/08/2020, 8:15 PM

## 2020-11-08 NOTE — ED Triage Notes (Signed)
Pt BIB EMS for LEFT hand swelling/pain, RIGHT calf swelling/pain, RIGHT flank pain. Pt recently seen for same. 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 Denies Received 25mg  benadryl en route. L hand and L leg do appear warm and edematous on assessment.

## 2020-11-08 NOTE — ED Notes (Addendum)
Synovial cell count and fluid culture collected by Charma Igo, PA. Walked the sample to main lab at 1455.

## 2020-11-08 NOTE — ED Notes (Signed)
Pt experienced episode of hypotension/dizziness/clammy and sweaty. Informed MD, got bolus order and hung bag, placed on bedside EKG, monitoring closely.

## 2020-11-08 NOTE — Progress Notes (Signed)
Lower extremity venous RT study completed.  Preliminary results relayed to Rubin Payor, MD.  See CV Proc for preliminary results report.   Jean Rosenthal, RDMS, RVT

## 2020-11-08 NOTE — Procedures (Signed)
Procedure: Right knee aspiration and injection   Indication: Right knee effusion(s)   Surgeon: Charma Igo, PA-C   Assist: None   Anesthesia: Topical refrigerant   EBL: None   Complications: None   Findings: After risks/benefits explained patient desires to undergo procedure. Consent obtained and time out performed. The right knee was sterilely prepped and aspirated. 81ml semi-opaque yellow fluid obtained. 60ml 0.5% Marcaine and 40 mg depomedrol instilled. Pt tolerated the procedure well.       Freeman Caldron, PA-C Orthopedic Surgery [phone]

## 2020-11-08 NOTE — ED Notes (Signed)
Pt resting in stretcher, reports feeling better than on arrival, dizziness and hypotension resolved. Pt asking for something to eat. L hand and R leg remain swollen but pain greatly improved so RN holding ordered morphine for the time being, MD aware.

## 2020-11-09 DIAGNOSIS — Z6830 Body mass index (BMI) 30.0-30.9, adult: Secondary | ICD-10-CM | POA: Diagnosis not present

## 2020-11-09 DIAGNOSIS — B951 Streptococcus, group B, as the cause of diseases classified elsewhere: Secondary | ICD-10-CM | POA: Diagnosis not present

## 2020-11-09 DIAGNOSIS — L03115 Cellulitis of right lower limb: Secondary | ICD-10-CM | POA: Diagnosis not present

## 2020-11-09 DIAGNOSIS — M069 Rheumatoid arthritis, unspecified: Secondary | ICD-10-CM | POA: Diagnosis not present

## 2020-11-09 DIAGNOSIS — E871 Hypo-osmolality and hyponatremia: Secondary | ICD-10-CM | POA: Diagnosis not present

## 2020-11-09 DIAGNOSIS — R7401 Elevation of levels of liver transaminase levels: Secondary | ICD-10-CM | POA: Diagnosis not present

## 2020-11-09 DIAGNOSIS — M25432 Effusion, left wrist: Secondary | ICD-10-CM | POA: Diagnosis not present

## 2020-11-09 DIAGNOSIS — D72829 Elevated white blood cell count, unspecified: Secondary | ICD-10-CM | POA: Diagnosis not present

## 2020-11-09 DIAGNOSIS — Z20822 Contact with and (suspected) exposure to covid-19: Secondary | ICD-10-CM | POA: Diagnosis not present

## 2020-11-09 DIAGNOSIS — M00261 Other streptococcal arthritis, right knee: Secondary | ICD-10-CM | POA: Diagnosis not present

## 2020-11-09 DIAGNOSIS — Z79899 Other long term (current) drug therapy: Secondary | ICD-10-CM | POA: Diagnosis not present

## 2020-11-09 DIAGNOSIS — M130 Polyarthritis, unspecified: Secondary | ICD-10-CM | POA: Diagnosis present

## 2020-11-09 DIAGNOSIS — K219 Gastro-esophageal reflux disease without esophagitis: Secondary | ICD-10-CM | POA: Diagnosis not present

## 2020-11-09 DIAGNOSIS — D638 Anemia in other chronic diseases classified elsewhere: Secondary | ICD-10-CM | POA: Diagnosis not present

## 2020-11-09 DIAGNOSIS — M25461 Effusion, right knee: Secondary | ICD-10-CM | POA: Diagnosis not present

## 2020-11-09 DIAGNOSIS — I10 Essential (primary) hypertension: Secondary | ICD-10-CM | POA: Diagnosis not present

## 2020-11-09 DIAGNOSIS — M00232 Other streptococcal arthritis, left wrist: Secondary | ICD-10-CM | POA: Diagnosis not present

## 2020-11-09 DIAGNOSIS — M609 Myositis, unspecified: Secondary | ICD-10-CM | POA: Diagnosis present

## 2020-11-09 DIAGNOSIS — M109 Gout, unspecified: Secondary | ICD-10-CM | POA: Diagnosis present

## 2020-11-09 DIAGNOSIS — M659 Synovitis and tenosynovitis, unspecified: Secondary | ICD-10-CM | POA: Diagnosis present

## 2020-11-09 DIAGNOSIS — E669 Obesity, unspecified: Secondary | ICD-10-CM | POA: Diagnosis not present

## 2020-11-09 DIAGNOSIS — E538 Deficiency of other specified B group vitamins: Secondary | ICD-10-CM | POA: Diagnosis present

## 2020-11-09 DIAGNOSIS — M1990 Unspecified osteoarthritis, unspecified site: Secondary | ICD-10-CM | POA: Diagnosis present

## 2020-11-09 LAB — HEPATITIS PANEL, ACUTE
HCV Ab: NONREACTIVE
Hep A IgM: NONREACTIVE
Hep B C IgM: NONREACTIVE
Hepatitis B Surface Ag: NONREACTIVE

## 2020-11-09 LAB — CBC
HCT: 38.9 % — ABNORMAL LOW (ref 39.0–52.0)
Hemoglobin: 12.8 g/dL — ABNORMAL LOW (ref 13.0–17.0)
MCH: 27.6 pg (ref 26.0–34.0)
MCHC: 32.9 g/dL (ref 30.0–36.0)
MCV: 84 fL (ref 80.0–100.0)
Platelets: 543 10*3/uL — ABNORMAL HIGH (ref 150–400)
RBC: 4.63 MIL/uL (ref 4.22–5.81)
RDW: 13.4 % (ref 11.5–15.5)
WBC: 26.7 10*3/uL — ABNORMAL HIGH (ref 4.0–10.5)
nRBC: 0 % (ref 0.0–0.2)

## 2020-11-09 LAB — COMPREHENSIVE METABOLIC PANEL
ALT: 72 U/L — ABNORMAL HIGH (ref 0–44)
AST: 30 U/L (ref 15–41)
Albumin: 2.2 g/dL — ABNORMAL LOW (ref 3.5–5.0)
Alkaline Phosphatase: 92 U/L (ref 38–126)
Anion gap: 8 (ref 5–15)
BUN: 8 mg/dL (ref 6–20)
CO2: 23 mmol/L (ref 22–32)
Calcium: 8.5 mg/dL — ABNORMAL LOW (ref 8.9–10.3)
Chloride: 100 mmol/L (ref 98–111)
Creatinine, Ser: 0.74 mg/dL (ref 0.61–1.24)
GFR, Estimated: >60 mL/min (ref >60)
Glucose, Bld: 143 mg/dL — ABNORMAL HIGH (ref 70–99)
Potassium: 4.6 mmol/L (ref 3.5–5.1)
Sodium: 131 mmol/L — ABNORMAL LOW (ref 135–145)
Total Bilirubin: 1.2 mg/dL (ref 0.3–1.2)
Total Protein: 7.3 g/dL (ref 6.5–8.1)

## 2020-11-09 LAB — RHEUMATOID FACTOR: Rheumatoid fact SerPl-aCnc: 30.4 IU/mL — ABNORMAL HIGH (ref <14.0)

## 2020-11-09 MED ORDER — SODIUM CHLORIDE 0.9 % IV BOLUS
500.0000 mL | Freq: Once | INTRAVENOUS | Status: AC
  Administered 2020-11-09: 500 mL via INTRAVENOUS

## 2020-11-09 MED ORDER — SODIUM CHLORIDE 0.9 % IV SOLN: INTRAVENOUS | Status: AC

## 2020-11-09 MED ORDER — FUROSEMIDE 10 MG/ML IJ SOLN: 40.0000 mg | Freq: Once | INTRAMUSCULAR | Status: DC

## 2020-11-09 NOTE — Progress Notes (Signed)
[facility] PROGRESS NOTE   Juan Tanner HOZ:[id number] DOB: [birth year] DOA: 11/08/2020  PCP: Pcp, No  Brief History/Interval Summary: 55 year old male with a past medical history of GERD, questionable history of gout who presented with right knee pain and swelling.  Apparently has been seen numerous times for this issue and it has always been attributed to gout.  He also experienced pain in the lower leg and noted to have swelling and redness extending into the calf.  He was subsequently hospitalized for further management.   Consultants: Orthopedics  Procedures: Right knee arthrocentesis  Antibiotics: Anti-infectives (From admission, onward)    Start     Dose/Rate Route Frequency Ordered Stop   11/08/20 1630  cefTRIAXone (ROCEPHIN) 1 g in sodium chloride 0.9 % 100 mL IVPB        1 g 200 mL/hr over 30 Minutes Intravenous Every 24 hours 11/08/20 1628         Subjective/Interval History: Patient mentions that his right knee feels much better after the arthrocentesis though he still has pain.  He also has pain in the left wrist area.  Denies any nausea vomiting.     Assessment/Plan:  Cellulitis of right lower extremity Apart from the joint effusion patient was also noted to have swelling of the right leg.  Doppler studies were negative for DVT.  Concern was for cellulitis and the patient was started on ceftriaxone.  Cultures are pending.  Patient mentions that the leg is doing better today.  Keep the left lower extremity elevated.  We will give him a dose of Lasix since it is quite swollen.  Polyarthritis with right knee effusion Patient with longstanding history of joint pains.  Attributed previously to gout.  Patient underwent arthrocentesis.  Synovial fluid positive for 15,350 WBC which is predominantly neutrophils.  This is not entirely consistent with septic joint.  Gram stain was negative.  Will wait to see if cultures grew out anything. No crystals were seen in the  synovial fluid.  Uric acid level was 4.4.  CRP noted to be elevated at 33.9.  ESR 45. Rheumatoid factor was 30.4.  Anti-CCP will be ordered as well.   If there is no growth in the cultures by tomorrow it may be reasonable to give a trial of steroids.  Patient will benefit from being evaluated by rheumatology in the outpatient setting.  This was discussed in detail with him today.  Transaminitis Mildly elevated AST and ALT was noted on admission.  Noted to be better today.  Reason is unclear.  Hepatitis panel is unremarkable.  HIV screen was negative.  Abdomen is benign.  Leukocytosis Most likely due to acute inflammation and infection.  Continue to trend.  Hyponatremia Normal saline infusion will be provided.  Recheck labs tomorrow.  Normocytic anemia No evidence of overt blood loss.  Check anemia panel in the morning.  Elevated blood pressure No documented history of hypertension.  Elevated blood pressure most likely due to pain.  Continue to monitor for now  Obesity Estimated body mass index is 30.38 kg/m as calculated from the following:   Height as of this encounter: _0  (1.651 m).   Weight as of this encounter: 82.8 kg.    DVT Prophylaxis: Lovenox Code Status: Full code Family Communication: Discussed with the patient Disposition Plan: Hopefully return home when improved  Status is: Observation  The patient will require care spanning > 2 midnights and should be moved to inpatient because: Ongoing active pain requiring inpatient pain  management, IV treatments appropriate due to intensity of illness or inability to take PO, and Inpatient level of care appropriate due to severity of illness  Dispo: The patient is from: Home              Anticipated d/c is to: Home              Patient currently is not medically stable to d/c.   Difficult to place patient No       Medications: Scheduled:  diclofenac Sodium  2 g Topical QID   enoxaparin (LOVENOX) injection  40 mg  Subcutaneous Q24H    morphine injection  4 mg Intravenous Once   sodium chloride flush  3 mL Intravenous Q12H   Continuous:  sodium chloride     cefTRIAXone (ROCEPHIN)  IV 1 g (11/08/20 1839)   TGG:YIRSWN chloride, acetaminophen, morphine injection, oxyCODONE, senna-docusate, sodium chloride flush, traMADol   Objective:  Vital Signs  Vitals:   11/09/20 0135 11/09/20 0334 11/09/20 0735 11/09/20 1127  BP: (!) 139/98 (!) 135/102 (!) 131/93 (!) 157/98  Pulse: (!) 107 (!) 108 (!) 110 (!) 106  Resp: _0 Temp: 98.8 F (37.1 C) 98.2 F (36.8 C) 98.3 F (36.8 C) 98.2 F (36.8 C)  TempSrc: Oral Oral Oral Oral  SpO2: 95% 97% 96% 96%  Weight:      Height:        Intake/Output Summary (Last 24 hours) at 11/09/2020 1322 Last data filed at 11/09/2020 0337 Gross per 24 hour  Intake 120 ml  Output --  Net 120 ml   Filed Weights   11/08/20 0948 11/08/20 1949  Weight: 83 kg 82.8 kg    General appearance: Awake alert.  In no distress Resp: Clear to auscultation bilaterally.  Normal effort Cardio: S1-S2 is normal regular.  No S3-S4.  No rubs murmurs or bruit GI: Abdomen is soft.  Nontender nondistended.  Bowel sounds are present normal.  No masses organomegaly Extremities: Swelling of the right lower extremity with warmth is noted.  Mild swelling of the right knee with good range of motion.  Decreased range of motion of the left wrist with mild swelling of the left wrist is noted. Neurologic: Alert and oriented x3.  No focal neurological deficits.    Lab Results:  Data Reviewed: I have personally reviewed following labs and imaging studies  CBC: Recent Labs  Lab 11/08/20 1005 11/09/20 0041  WBC 22.0* 26.7*  NEUTROABS 15.7*  --   HGB 13.8 12.8*  HCT 41.4 38.9*  MCV 84.1 84.0  PLT 527* 543*    Basic Metabolic Panel: Recent Labs  Lab 11/08/20 1230 11/09/20 0041  NA 137 131*  K 4.2 4.6  CL 103 100  CO2 24 23  GLUCOSE 104* 143*  BUN 9 8  CREATININE 0.66 0.74   CALCIUM 7.9* 8.5*    GFR: Estimated Creatinine Clearance: 104.5 mL/min (by C-G formula based on SCr of 0.74 mg/dL).  Liver Function Tests: Recent Labs  Lab 11/08/20 1230 11/09/20 0041  AST 59* 30  ALT 89* 72*  ALKPHOS 90 92  BILITOT 2.2* 1.2  PROT 6.5 7.3  ALBUMIN 2.1* 2.2*      Cardiac Enzymes: Recent Labs  Lab 11/08/20 1230  CKTOTAL 51     Thyroid Function Tests: Recent Labs    11/08/20 1734  TSH 1.211      Recent Results (from the past 240 hour(s))  Body fluid culture w Gram Stain  Status: None (Preliminary result)   Collection Time: 11/08/20  2:04 PM   Specimen: Body Fluid  Result Value Ref Range Status   Specimen Description FLUID  Final   Special Requests SYNOVIAL RIGHT KNEE  Final   Gram Stain   Final    MODERATE WBC PRESENT, PREDOMINANTLY PMN NO ORGANISMS SEEN    Culture   Final    CULTURE REINCUBATED FOR BETTER GROWTH Performed at [facility], [place] [address] [place], [place] [address]    Report Status PENDING  Incomplete  Resp Panel by RT-PCR (Flu A&B, Covid) Nasopharyngeal Swab     Status: None   Collection Time: 11/08/20  4:00 PM   Specimen: Nasopharyngeal Swab; Nasopharyngeal(NP) swabs in vial transport medium  Result Value Ref Range Status   SARS Coronavirus 2 by RT PCR NEGATIVE NEGATIVE Final    Comment: (NOTE) SARS-CoV-2 target nucleic acids are NOT DETECTED.  The SARS-CoV-2 RNA is generally detectable in upper respiratory specimens during the acute phase of infection. The lowest concentration of SARS-CoV-2 viral copies this assay can detect is 138 copies/mL. A negative result does not preclude SARS-Cov-2 infection and should not be used as the sole basis for treatment or other patient management decisions. A negative result may occur with  improper specimen collection/handling, submission of specimen other than nasopharyngeal swab, presence of viral mutation(s) within the areas targeted by this assay, and inadequate  number of viral copies(<138 copies/mL). A negative result must be combined with clinical observations, patient history, and epidemiological information. The expected result is Negative.  Fact Sheet for Patients:  EntrepreneurPulse.com.au  Fact Sheet for Healthcare Providers:  IncredibleEmployment.be  This test is no t yet approved or cleared by the [place] FDA and  has been authorized for detection and/or diagnosis of SARS-CoV-2 by FDA under an Emergency Use Authorization (EUA). This EUA will remain  in effect (meaning this test can be used) for the duration of the COVID-19 declaration under Section 564(b)(1) of the Act, 21 U.S.C.section 360bbb-3(b)(1), unless the authorization is terminated  or revoked sooner.       Influenza A by PCR NEGATIVE NEGATIVE Final   Influenza B by PCR NEGATIVE NEGATIVE Final    Comment: (NOTE) The Xpert Xpress SARS-CoV-2/FLU/RSV plus assay is intended as an aid in the diagnosis of influenza from Nasopharyngeal swab specimens and should not be used as a sole basis for treatment. Nasal washings and aspirates are unacceptable for Xpert Xpress SARS-CoV-2/FLU/RSV testing.  Fact Sheet for Patients: EntrepreneurPulse.com.au  Fact Sheet for Healthcare Providers: IncredibleEmployment.be  This test is not yet approved or cleared by the [place] FDA and has been authorized for detection and/or diagnosis of SARS-CoV-2 by FDA under an Emergency Use Authorization (EUA). This EUA will remain in effect (meaning this test can be used) for the duration of the COVID-19 declaration under Section 564(b)(1) of the Act, 21 U.S.C. section 360bbb-3(b)(1), unless the authorization is terminated or revoked.  Performed at [facility], [place] [address] [place], [place] [address]   Culture, blood (routine x 2)     Status: None (Preliminary result)   Collection Time: 11/08/20  8:54 PM    Specimen: BLOOD  Result Value Ref Range Status   Specimen Description BLOOD LEFT ANTECUBITAL  Final   Special Requests   Final    BOTTLES DRAWN AEROBIC AND ANAEROBIC Blood Culture adequate volume   Culture   Final    NO GROWTH < 12 HOURS Performed at [facility],  [address], [place], [place] [address]    Report Status PENDING  Incomplete  Culture, blood (routine x 2)     Status: None (Preliminary result)   Collection Time: 11/08/20  8:59 PM   Specimen: BLOOD LEFT HAND  Result Value Ref Range Status   Specimen Description BLOOD LEFT HAND  Final   Special Requests   Final    BOTTLES DRAWN AEROBIC AND ANAEROBIC Blood Culture results may not be optimal due to an inadequate volume of blood received in culture bottles   Culture   Final    NO GROWTH < 12 HOURS Performed at [facility], [place] [address], [place], [place] [address]    Report Status PENDING  Incomplete      Radiology Studies: DG Knee 2 Views Right  Result Date: 11/08/2020 CLINICAL DATA:  Evaluate for effusion. EXAM: RIGHT KNEE - 1-2 VIEW COMPARISON:  None. FINDINGS: There is a small suprapatellar joint effusion. No acute fracture or dislocation. No radio-opaque foreign bodies are soft tissue calcifications. IMPRESSION: Small suprapatellar joint effusion. Electronically Signed   By: Kerby Moors M.D.   On: 11/08/2020 11:09   VAS [place] LOWER EXTREMITY VENOUS (DVT) (ONLY [facility] & [facility] 7a-7p)  Result Date: 11/08/2020  Lower Venous DVT Study Patient Name:  Juan Tanner  Date of Exam:   11/08/2020 Medical Rec #: [id number]       Accession #:    [id number] Date of Birth: January 24, 1966        Patient Gender: M Patient Age:   67 years Exam Location:  [facility] Procedure:      VAS [place] LOWER EXTREMITY VENOUS (DVT) Referring Phys: Davonna Belling --------------------------------------------------------------------------------  Indications: Pain in RT foot, wound on lateral aspect of foot.  Comparison Study: No prior studies.  Performing Technologist: Darlin Coco RDMS,RVT  Examination Guidelines: A complete evaluation includes B-mode imaging, spectral Doppler, color Doppler, and power Doppler as needed of all accessible portions of each vessel. Bilateral testing is considered an integral part of a complete examination. Limited examinations for reoccurring indications may be performed as noted. The reflux portion of the exam is performed with the patient in reverse Trendelenburg.  +---------+---------------+---------+-----------+----------+--------------+ RIGHT    CompressibilityPhasicitySpontaneityPropertiesThrombus Aging +---------+---------------+---------+-----------+----------+--------------+ CFV      Full           Yes      Yes                                 +---------+---------------+---------+-----------+----------+--------------+ SFJ      Full                                                        +---------+---------------+---------+-----------+----------+--------------+ FV Prox  Full                                                        +---------+---------------+---------+-----------+----------+--------------+ FV Mid   Full                                                        +---------+---------------+---------+-----------+----------+--------------+  FV DistalFull                                                        +---------+---------------+---------+-----------+----------+--------------+ PFV      Full                                                        +---------+---------------+---------+-----------+----------+--------------+ POP      Full           Yes      Yes                                 +---------+---------------+---------+-----------+----------+--------------+ PTV      Full                                                        +---------+---------------+---------+-----------+----------+--------------+ PERO     Full                                                         +---------+---------------+---------+-----------+----------+--------------+ Gastroc  Full                                                        +---------+---------------+---------+-----------+----------+--------------+   +----+---------------+---------+-----------+----------+--------------+ LEFTCompressibilityPhasicitySpontaneityPropertiesThrombus Aging +----+---------------+---------+-----------+----------+--------------+ CFV Full           Yes      Yes                                 +----+---------------+---------+-----------+----------+--------------+     Summary: RIGHT: - There is no evidence of deep vein thrombosis in the lower extremity.  - No cystic structure found in the popliteal fossa.  LEFT: - No evidence of common femoral vein obstruction.  *See table(s) above for measurements and observations. Electronically signed by Jamelle Haring on 11/08/2020 at 5:29:17 PM.    Final        LOS: 0 days   [facility] Pager on [email or web address]  11/09/2020, 1:22 PM

## 2020-11-09 NOTE — Progress Notes (Signed)
Labs and synovial fluid panel reviewed.   Serum WBC 26 ESR 45 CRP 33.9  Synovial WBC 15K 93% N Gram stain (-)   Rheumatoid factor (+).   This appears to be inflammatory arthritis. Will follow synovial culture.

## 2020-11-10 ENCOUNTER — Inpatient Hospital Stay (HOSPITAL_COMMUNITY): Payer: BLUE CROSS/BLUE SHIELD

## 2020-11-10 DIAGNOSIS — R7401 Elevation of levels of liver transaminase levels: Secondary | ICD-10-CM

## 2020-11-10 LAB — CBC
HCT: 35.6 % — ABNORMAL LOW (ref 39.0–52.0)
Hemoglobin: 11.5 g/dL — ABNORMAL LOW (ref 13.0–17.0)
MCH: 27.4 pg (ref 26.0–34.0)
MCHC: 32.3 g/dL (ref 30.0–36.0)
MCV: 84.8 fL (ref 80.0–100.0)
Platelets: 500 10*3/uL — ABNORMAL HIGH (ref 150–400)
RBC: 4.2 MIL/uL — ABNORMAL LOW (ref 4.22–5.81)
RDW: 13.5 % (ref 11.5–15.5)
WBC: 27.5 10*3/uL — ABNORMAL HIGH (ref 4.0–10.5)
nRBC: 0 % (ref 0.0–0.2)

## 2020-11-10 LAB — FOLATE: Folate: 5 ng/mL — ABNORMAL LOW (ref >5.9)

## 2020-11-10 LAB — IRON AND TIBC
Iron: 38 ug/dL — ABNORMAL LOW (ref 45–182)
Saturation Ratios: 18 % (ref 17.9–39.5)
TIBC: 206 ug/dL — ABNORMAL LOW (ref 250–450)
UIBC: 168 ug/dL

## 2020-11-10 LAB — COMPREHENSIVE METABOLIC PANEL
ALT: 79 U/L — ABNORMAL HIGH (ref 0–44)
AST: 48 U/L — ABNORMAL HIGH (ref 15–41)
Albumin: 2.2 g/dL — ABNORMAL LOW (ref 3.5–5.0)
Alkaline Phosphatase: 84 U/L (ref 38–126)
Anion gap: 10 (ref 5–15)
BUN: 15 mg/dL (ref 6–20)
CO2: 24 mmol/L (ref 22–32)
Calcium: 8.5 mg/dL — ABNORMAL LOW (ref 8.9–10.3)
Chloride: 100 mmol/L (ref 98–111)
Creatinine, Ser: 0.71 mg/dL (ref 0.61–1.24)
GFR, Estimated: >60 mL/min (ref >60)
Glucose, Bld: 166 mg/dL — ABNORMAL HIGH (ref 70–99)
Potassium: 3.7 mmol/L (ref 3.5–5.1)
Sodium: 134 mmol/L — ABNORMAL LOW (ref 135–145)
Total Bilirubin: 0.7 mg/dL (ref 0.3–1.2)
Total Protein: 7.5 g/dL (ref 6.5–8.1)

## 2020-11-10 LAB — RETICULOCYTES
Immature Retic Fract: 19.2 % — ABNORMAL HIGH (ref 2.3–15.9)
RBC.: 4.19 MIL/uL — ABNORMAL LOW (ref 4.22–5.81)
Retic Count, Absolute: 38.1 10*3/uL (ref 19.0–186.0)
Retic Ct Pct: 0.9 % (ref 0.4–3.1)

## 2020-11-10 LAB — VITAMIN B12: Vitamin B-12: 168 pg/mL — ABNORMAL LOW (ref 180–914)

## 2020-11-10 LAB — FERRITIN: Ferritin: 2654 ng/mL — ABNORMAL HIGH (ref 24–336)

## 2020-11-10 MED ORDER — FOLIC ACID 1 MG PO TABS
1.0000 mg | ORAL_TABLET | Freq: Every day | ORAL | Status: DC
  Administered 2020-11-10 – 2020-11-19 (×10): 1 mg via ORAL
  Filled 2020-11-10 (×10): qty 1

## 2020-11-10 MED ORDER — CYANOCOBALAMIN 1000 MCG/ML IJ SOLN
1000.0000 ug | Freq: Every day | INTRAMUSCULAR | Status: AC
  Administered 2020-11-10 – 2020-11-16 (×7): 1000 ug via INTRAMUSCULAR
  Filled 2020-11-10 (×7): qty 1

## 2020-11-10 MED ORDER — SODIUM CHLORIDE 0.9 % IV SOLN: INTRAVENOUS | Status: AC

## 2020-11-10 NOTE — Progress Notes (Signed)
[facility] PROGRESS NOTE   Juan Tanner ZCH:[id number] DOB: 1966-03-06 DOA: 11/08/2020  PCP: Pcp, No  Brief History/Interval Summary: 55 year old male with a past medical history of GERD, questionable history of gout who presented with right knee pain and swelling.  Apparently has been seen numerous times for this issue and it has always been attributed to gout.  He also experienced pain in the lower leg and noted to have swelling and redness extending into the calf.  He was subsequently hospitalized for further management.   Consultants: Orthopedics  Procedures: Right knee arthrocentesis  Antibiotics: Anti-infectives (From admission, onward)    Start     Dose/Rate Route Frequency Ordered Stop   11/08/20 1630  cefTRIAXone (ROCEPHIN) 1 g in sodium chloride 0.9 % 100 mL IVPB        1 g 200 mL/hr over 30 Minutes Intravenous Every 24 hours 11/08/20 1628         Subjective/Interval History: Patient mentions that his right knee and leg feels much better.  Symptoms have almost resolved.  Swelling is decreasing.  But he is mainly concerned about his left wrist now.  Is quite painful and swollen.      Assessment/Plan:  Cellulitis of right lower extremity Apart from the joint effusion patient was also noted to have swelling of the right leg.  Doppler studies were negative for DVT.  Concern was for cellulitis and the patient was started on ceftriaxone.   Blood cultures are negative but joint fluid culture is growing strep agalactiae.  Continue ceftriaxone.  Keep the leg elevated.  Swelling has decreased.  He eventually did not get furosemide yesterday. Hold off for now.  Polyarthritis with right knee effusion/positive for strep agalactiae Patient with longstanding history of joint pains.  Attributed previously to gout.  Patient underwent arthrocentesis.  Synovial fluid positive for 15,350 WBC which is predominantly neutrophils.  This is not entirely consistent with septic joint.   Gram stain was negative.  However culture is growing strep.  Collect UA.  Orthopedics is following.  They will initially thinking that the patient may need irrigation and drainage.  However considering the clinical improvement they do not think this is indicated at this time.   No crystals were seen in the synovial fluid.  Uric acid level was 4.4.  CRP noted to be elevated at 33.9.  ESR 45. Rheumatoid factor was 30.4.  Anti-CCP is pending. Patient will benefit from being evaluated by rheumatology in the outpatient setting.  This was discussed in detail with him.  We will send an ambulatory referral to Dr. Benjamine Mola.  Left wrist pain and swelling To be evaluated by hand surgeon today.  Transaminitis Mildly elevated AST and ALT was noted on admission.  Reason is unclear.  Hepatitis panel is unremarkable.  HIV screen was negative.  Abdomen is benign.  We will do right upper quadrant ultrasound.  Vitamin O87 and folic acid deficiency Vitamin B12 noted to be 867 and folic acid 5.0.  These will be supplemented.  Leukocytosis Most likely due to acute inflammation and infection.  Continue to trend.  Hyponatremia Improved with normal saline.  Continue to monitor.  Normocytic anemia No evidence of overt blood loss.  Vitamin E72 and folic acid deficiency noted as discussed above.  Elevated blood pressure No documented history of hypertension.  Elevated blood pressure most likely due to pain.  Seems to be better this morning.  Continue to monitor off of antihypertensives.  Obesity Estimated body mass index is 30.38  kg/m as calculated from the following:   Height as of this encounter: 5' 5"  (1.651 m).   Weight as of this encounter: 82.8 kg.    DVT Prophylaxis: Lovenox Code Status: Full code Family Communication: Discussed with the patient Disposition Plan: Hopefully return home when improved  Status is: Inpatient  Remains inpatient appropriate because:Ongoing active pain requiring inpatient  pain management, IV treatments appropriate due to intensity of illness or inability to take PO, and Inpatient level of care appropriate due to severity of illness  Dispo:  Patient From: Home  Planned Disposition: Home  Medically stable for discharge: No          Medications: Scheduled:  cyanocobalamin  1,000 mcg Intramuscular Daily   diclofenac Sodium  2 g Topical QID   folic acid  1 mg Oral Daily    morphine injection  4 mg Intravenous Once   sodium chloride flush  3 mL Intravenous Q12H   Continuous:  sodium chloride     cefTRIAXone (ROCEPHIN)  IV 1 g (11/09/20 1706)   DDU:KGURKY chloride, acetaminophen, morphine injection, oxyCODONE, senna-docusate, sodium chloride flush, traMADol   Objective:  Vital Signs  Vitals:   11/09/20 1127 11/09/20 1802 11/09/20 2306 11/10/20 0441  BP: (!) 157/98 (!) 145/96 117/70 121/87  Pulse: (!) 106 (!) 105 86 79  Resp: 16 16 19 15   Temp: 98.2 F (36.8 C) 98.3 F (36.8 C) 98.9 F (37.2 C) 98.6 F (37 C)  TempSrc: Oral Oral Oral Oral  SpO2: 96% 100% 94% 96%  Weight:      Height:        Intake/Output Summary (Last 24 hours) at 11/10/2020 1103 Last data filed at 11/10/2020 0442 Gross per 24 hour  Intake 1237.5 ml  Output --  Net 1237.5 ml    Filed Weights   11/08/20 0948 11/08/20 1949  Weight: 83 kg 82.8 kg    General appearance: Awake alert.  In no distress Resp: Clear to auscultation bilaterally.  Normal effort Cardio: S1-S2 is normal regular.  No S3-S4.  No rubs murmurs or bruit GI: Abdomen is soft.  Nontender nondistended.  Bowel sounds are present normal.  No masses organomegaly Extremities: Right knee swelling is much improved.  Much improved range of motion.  Right leg swelling is also much better.  Continues to have pain swelling and limited range of motion of the left wrist.  Good peripheral pulses. Neurologic: Alert and oriented x3.  No focal neurological deficits.     Lab Results:  Data Reviewed: I have  personally reviewed following labs and imaging studies  CBC: Recent Labs  Lab 11/08/20 1005 11/09/20 0041 11/10/20 0036  WBC 22.0* 26.7* 27.5*  NEUTROABS 15.7*  --   --   HGB 13.8 12.8* 11.5*  HCT 41.4 38.9* 35.6*  MCV 84.1 84.0 84.8  PLT 527* 543* 500*     Basic Metabolic Panel: Recent Labs  Lab 11/08/20 1230 11/09/20 0041 11/10/20 0036  NA 137 131* 134*  K 4.2 4.6 3.7  CL 103 100 100  CO2 24 23 24   GLUCOSE 104* 143* 166*  BUN 9 8 15   CREATININE 0.66 0.74 0.71  CALCIUM 7.9* 8.5* 8.5*     GFR: Estimated Creatinine Clearance: 104.5 mL/min (by C-G formula based on SCr of 0.71 mg/dL).  Liver Function Tests: Recent Labs  Lab 11/08/20 1230 11/09/20 0041 11/10/20 0036  AST 59* 30 48*  ALT 89* 72* 79*  ALKPHOS 90 92 84  BILITOT 2.2* 1.2 0.7  PROT  6.5 7.3 7.5  ALBUMIN 2.1* 2.2* 2.2*       Cardiac Enzymes: Recent Labs  Lab 11/08/20 1230  CKTOTAL 51      Thyroid Function Tests: Recent Labs    11/08/20 1734  TSH 1.211       Recent Results (from the past 240 hour(s))  Body fluid culture w Gram Stain     Status: None (Preliminary result)   Collection Time: 11/08/20  2:04 PM   Specimen: Body Fluid  Result Value Ref Range Status   Specimen Description FLUID  Final   Special Requests SYNOVIAL RIGHT KNEE  Final   Gram Stain   Final    MODERATE WBC PRESENT, PREDOMINANTLY PMN NO ORGANISMS SEEN    Culture   Final    FEW STREPTOCOCCUS AGALACTIAE SUSCEPTIBILITIES TO FOLLOW Performed at [facility], [place] [address], [place], [place] [address]    Report Status PENDING  Incomplete  Resp Panel by RT-PCR (Flu A&B, Covid) Nasopharyngeal Swab     Status: None   Collection Time: 11/08/20  4:00 PM   Specimen: Nasopharyngeal Swab; Nasopharyngeal(NP) swabs in vial transport medium  Result Value Ref Range Status   SARS Coronavirus 2 by RT PCR NEGATIVE NEGATIVE Final    Comment: (NOTE) SARS-CoV-2 target nucleic acids are NOT DETECTED.  The  SARS-CoV-2 RNA is generally detectable in upper respiratory specimens during the acute phase of infection. The lowest concentration of SARS-CoV-2 viral copies this assay can detect is 138 copies/mL. A negative result does not preclude SARS-Cov-2 infection and should not be used as the sole basis for treatment or other patient management decisions. A negative result may occur with  improper specimen collection/handling, submission of specimen other than nasopharyngeal swab, presence of viral mutation(s) within the areas targeted by this assay, and inadequate number of viral copies(<138 copies/mL). A negative result must be combined with clinical observations, patient history, and epidemiological information. The expected result is Negative.  Fact Sheet for Patients:  EntrepreneurPulse.com.au  Fact Sheet for Healthcare Providers:  IncredibleEmployment.be  This test is no t yet approved or cleared by the [place] FDA and  has been authorized for detection and/or diagnosis of SARS-CoV-2 by FDA under an Emergency Use Authorization (EUA). This EUA will remain  in effect (meaning this test can be used) for the duration of the COVID-19 declaration under Section 564(b)(1) of the Act, 21 U.S.C.section 360bbb-3(b)(1), unless the authorization is terminated  or revoked sooner.       Influenza A by PCR NEGATIVE NEGATIVE Final   Influenza B by PCR NEGATIVE NEGATIVE Final    Comment: (NOTE) The Xpert Xpress SARS-CoV-2/FLU/RSV plus assay is intended as an aid in the diagnosis of influenza from Nasopharyngeal swab specimens and should not be used as a sole basis for treatment. Nasal washings and aspirates are unacceptable for Xpert Xpress SARS-CoV-2/FLU/RSV testing.  Fact Sheet for Patients: EntrepreneurPulse.com.au  Fact Sheet for Healthcare Providers: IncredibleEmployment.be  This test is not yet approved or  cleared by the [place] FDA and has been authorized for detection and/or diagnosis of SARS-CoV-2 by FDA under an Emergency Use Authorization (EUA). This EUA will remain in effect (meaning this test can be used) for the duration of the COVID-19 declaration under Section 564(b)(1) of the Act, 21 U.S.C. section 360bbb-3(b)(1), unless the authorization is terminated or revoked.  Performed at [facility], [place] [address] [place], [place] [address]   Culture, blood (routine x 2)     Status: None (  Preliminary result)   Collection Time: 11/08/20  8:54 PM   Specimen: BLOOD  Result Value Ref Range Status   Specimen Description BLOOD LEFT ANTECUBITAL  Final   Special Requests   Final    BOTTLES DRAWN AEROBIC AND ANAEROBIC Blood Culture adequate volume   Culture   Final    NO GROWTH 2 DAYS Performed at [facility], [address], [place], [place] [address]    Report Status PENDING  Incomplete  Culture, blood (routine x 2)     Status: None (Preliminary result)   Collection Time: 11/08/20  8:59 PM   Specimen: BLOOD LEFT HAND  Result Value Ref Range Status   Specimen Description BLOOD LEFT HAND  Final   Special Requests   Final    BOTTLES DRAWN AEROBIC AND ANAEROBIC Blood Culture results may not be optimal due to an inadequate volume of blood received in culture bottles   Culture   Final    NO GROWTH 2 DAYS Performed at [facility], [place] [address] [place], [place] [address]    Report Status PENDING  Incomplete       Radiology Studies: VAS [place] LOWER EXTREMITY VENOUS (DVT) (ONLY [facility] & [facility] 7a-7p)  Result Date: 11/08/2020  Lower Venous DVT Study Patient Name:  Juan Tanner  Date of Exam:   11/08/2020 Medical Rec #: [id number]       Accession #:    [id number] Date of Birth: 02/09/1966        Patient Gender: M Patient Age:   47 years Exam Location:  [facility] Procedure:      VAS [place] LOWER EXTREMITY VENOUS (DVT) Referring Phys: Davonna Belling  --------------------------------------------------------------------------------  Indications: Pain in RT foot, wound on lateral aspect of foot.  Comparison Study: No prior studies. Performing Technologist: Darlin Coco RDMS,RVT  Examination Guidelines: A complete evaluation includes B-mode imaging, spectral Doppler, color Doppler, and power Doppler as needed of all accessible portions of each vessel. Bilateral testing is considered an integral part of a complete examination. Limited examinations for reoccurring indications may be performed as noted. The reflux portion of the exam is performed with the patient in reverse Trendelenburg.  +---------+---------------+---------+-----------+----------+--------------+ RIGHT    CompressibilityPhasicitySpontaneityPropertiesThrombus Aging +---------+---------------+---------+-----------+----------+--------------+ CFV      Full           Yes      Yes                                 +---------+---------------+---------+-----------+----------+--------------+ SFJ      Full                                                        +---------+---------------+---------+-----------+----------+--------------+ FV Prox  Full                                                        +---------+---------------+---------+-----------+----------+--------------+ FV Mid   Full                                                        +---------+---------------+---------+-----------+----------+--------------+  FV DistalFull                                                        +---------+---------------+---------+-----------+----------+--------------+ PFV      Full                                                        +---------+---------------+---------+-----------+----------+--------------+ POP      Full           Yes      Yes                                 +---------+---------------+---------+-----------+----------+--------------+ PTV       Full                                                        +---------+---------------+---------+-----------+----------+--------------+ PERO     Full                                                        +---------+---------------+---------+-----------+----------+--------------+ Gastroc  Full                                                        +---------+---------------+---------+-----------+----------+--------------+   +----+---------------+---------+-----------+----------+--------------+ LEFTCompressibilityPhasicitySpontaneityPropertiesThrombus Aging +----+---------------+---------+-----------+----------+--------------+ CFV Full           Yes      Yes                                 +----+---------------+---------+-----------+----------+--------------+     Summary: RIGHT: - There is no evidence of deep vein thrombosis in the lower extremity.  - No cystic structure found in the popliteal fossa.  LEFT: - No evidence of common femoral vein obstruction.  *See table(s) above for measurements and observations. Electronically signed by Jamelle Haring on 11/08/2020 at 5:29:17 PM.    Final        LOS: 1 day   [facility] Pager on [email or web address]  11/10/2020, 11:03 AM

## 2020-11-10 NOTE — Progress Notes (Signed)
Subjective: Patient states that he has no pain in his right knee and that it feels completely better. Has pain and swelling in left wrist  Aspirate from knee Friday has grown Strep Agalactiae   Objective: Vital signs in last 24 hours: Temp:  [98.2 F (36.8 C)-98.9 F (37.2 C)] 98.6 F (37 C) (08/07 0441) Pulse Rate:  [79-106] 79 (08/07 0441) Resp:  [15-19] 15 (08/07 0441) BP: (117-157)/(70-98) 121/87 (08/07 0441) SpO2:  [94 %-100 %] 96 % (08/07 0441)  Intake/Output from previous day: 08/06 0701 - 08/07 0700 In: 1237.5 [P.O.:960; I.V.:177.5; IV Piggyback:100] Out: -  Intake/Output this shift: No intake/output data recorded.  Recent Labs    11/08/20 1005 11/09/20 0041 11/10/20 0036  HGB 13.8 12.8* 11.5*   Recent Labs    11/09/20 0041 11/10/20 0036  WBC 26.7* 27.5*  RBC 4.63 4.20*  4.19*  HCT 38.9* 35.6*  PLT 543* 500*   Recent Labs    11/09/20 0041 11/10/20 0036  NA 131* 134*  K 4.6 3.7  CL 100 100  CO2 23 24  BUN 8 15  CREATININE 0.74 0.71  GLUCOSE 143* 166*  CALCIUM 8.5* 8.5*   No results for input(s): LABPT, INR in the last 72 hours.  No cellulitis present Right knee- No warmth or effusion; normal range of motion without pain; no joint line or soft tissue tenderness    Assessment/Plan: Right knee pain- He is completely asymptomatic at this time despite the positive culture from the aspirate 2 days ago. It is likely that the aspiration plus antibiotics was curative. His knee exam is essentially normal now. I was prepared to take him to surgery today for an irrigation and debridement but given his clinical exam, I do not feel that it is indicated. He does have swelling and pain in his left wrist and I have spoken with Dr Everardo Pacific, who is on hand call today and he will evaluate it.   Homero Fellers Othelia Riederer 11/10/2020, 10:32 AM

## 2020-11-10 NOTE — Progress Notes (Signed)
Patient's synovial knee  fluid culture was resulted. Notified MD Osvaldo Shipper.

## 2020-11-10 NOTE — Progress Notes (Signed)
Synovial fluid culture is now growing GBS. NPO order placed. Discussed patient with Dr. Lequita Halt who will take for I&D today.

## 2020-11-11 ENCOUNTER — Inpatient Hospital Stay (HOSPITAL_COMMUNITY): Payer: BLUE CROSS/BLUE SHIELD

## 2020-11-11 DIAGNOSIS — M25432 Effusion, left wrist: Secondary | ICD-10-CM

## 2020-11-11 LAB — COMPREHENSIVE METABOLIC PANEL
ALT: 89 U/L — ABNORMAL HIGH (ref 0–44)
AST: 42 U/L — ABNORMAL HIGH (ref 15–41)
Albumin: 2.3 g/dL — ABNORMAL LOW (ref 3.5–5.0)
Alkaline Phosphatase: 75 U/L (ref 38–126)
Anion gap: 10 (ref 5–15)
BUN: 14 mg/dL (ref 6–20)
CO2: 22 mmol/L (ref 22–32)
Calcium: 8.4 mg/dL — ABNORMAL LOW (ref 8.9–10.3)
Chloride: 100 mmol/L (ref 98–111)
Creatinine, Ser: 0.76 mg/dL (ref 0.61–1.24)
GFR, Estimated: >60 mL/min (ref >60)
Glucose, Bld: 110 mg/dL — ABNORMAL HIGH (ref 70–99)
Potassium: 3.9 mmol/L (ref 3.5–5.1)
Sodium: 132 mmol/L — ABNORMAL LOW (ref 135–145)
Total Bilirubin: 0.8 mg/dL (ref 0.3–1.2)
Total Protein: 7.8 g/dL (ref 6.5–8.1)

## 2020-11-11 LAB — CBC
HCT: 38 % — ABNORMAL LOW (ref 39.0–52.0)
Hemoglobin: 12.2 g/dL — ABNORMAL LOW (ref 13.0–17.0)
MCH: 27.2 pg (ref 26.0–34.0)
MCHC: 32.1 g/dL (ref 30.0–36.0)
MCV: 84.6 fL (ref 80.0–100.0)
Platelets: 530 10*3/uL — ABNORMAL HIGH (ref 150–400)
RBC: 4.49 MIL/uL (ref 4.22–5.81)
RDW: 13.4 % (ref 11.5–15.5)
WBC: 17.2 10*3/uL — ABNORMAL HIGH (ref 4.0–10.5)
nRBC: 0 % (ref 0.0–0.2)

## 2020-11-11 LAB — SYNOVIAL CELL COUNT + DIFF, W/ CRYSTALS
Crystals, Fluid: NONE SEEN
Lymphocytes-Synovial Fld: 1 % (ref 0–20)
Monocyte-Macrophage-Synovial Fluid: 1 % — ABNORMAL LOW (ref 50–90)
Neutrophil, Synovial: 98 % — ABNORMAL HIGH (ref 0–25)
WBC, Synovial: 51 /mm3 (ref 0–200)

## 2020-11-11 LAB — BODY FLUID CULTURE W GRAM STAIN

## 2020-11-11 LAB — URINE CYTOLOGY ANCILLARY ONLY
Chlamydia: NEGATIVE
Comment: NEGATIVE
Comment: NORMAL
Neisseria Gonorrhea: NEGATIVE

## 2020-11-11 LAB — ANA W/REFLEX IF POSITIVE: Anti Nuclear Antibody (ANA): NEGATIVE

## 2020-11-11 MED ORDER — SODIUM CHLORIDE (PF) 0.9 % IJ SOLN
5.0000 mL | Freq: Once | INTRAMUSCULAR | Status: AC
  Administered 2020-11-11: 5 mL

## 2020-11-11 MED ORDER — IOHEXOL 180 MG/ML  SOLN
5.0000 mL | Freq: Once | INTRAMUSCULAR | Status: AC | PRN
  Administered 2020-11-11: 5 mL via INTRA_ARTICULAR

## 2020-11-11 MED ORDER — LIDOCAINE HCL (PF) 1 % IJ SOLN
5.0000 mL | Freq: Once | INTRAMUSCULAR | Status: AC
  Administered 2020-11-11: 5 mL via INTRADERMAL

## 2020-11-11 NOTE — Plan of Care (Signed)
Pt rested some during overnight. Med compliant. Pt received Morphine x 1 not effective. Pt requested not receive anymore. Oxy given x2 after patient agreeable. No tramadol given due to pt refused. Lowest pain level achieved was #6. Hand elevated entire shift and ice pack applied by Riley Lam with oxy at appox. [id number] Problem: Pain Managment: Goal: General experience of comfort will improve Outcome: Progressing   Problem: Education: Goal: Knowledge of General Education information will improve Description: Including pain rating scale, medication(s)/side effects and non-pharmacologic comfort measures Outcome: Progressing   Problem: Health Behavior/Discharge Planning: Goal: Ability to manage health-related needs will improve Outcome: Progressing   Problem: Clinical Measurements: Goal: Ability to maintain clinical measurements within normal limits will improve Outcome: Progressing Goal: Will remain free from infection Outcome: Progressing Goal: Diagnostic test results will improve Outcome: Progressing Goal: Respiratory complications will improve Outcome: Progressing Goal: Cardiovascular complication will be avoided Outcome: Progressing   Problem: Activity: Goal: Risk for activity intolerance will decrease Outcome: Progressing   Problem: Nutrition: Goal: Adequate nutrition will be maintained Outcome: Progressing   Problem: Coping: Goal: Level of anxiety will decrease Outcome: Progressing   Problem: Elimination: Goal: Will not experience complications related to bowel motility Outcome: Progressing Goal: Will not experience complications related to urinary retention Outcome: Progressing    Problem: Safety: Goal: Ability to remain free from injury will improve Outcome: Progressing   Problem: Skin Integrity: Goal: Risk for impaired skin integrity will decrease Outcome: Progressing

## 2020-11-11 NOTE — Progress Notes (Signed)
[facility]  PROGRESS NOTE  Juan Tanner BRA:[id number] DOB: [birth year] DOA: 11/08/2020 PCP: Pcp, No   Brief HPI:   55 year old male with medical history of GERD, gout presents with right knee swelling and swelling of left wrist.    Subjective   Patient seen and examined, right knee swelling has significantly improved, still has swelling of the left wrist.  Abdominal ultrasound was unremarkable.   Assessment/Plan:    Cellulitis of right lower extremity -Significantly improved with IV antibiotics -Venous duplex was negative for DVT -Edema has improved  Polyarthritis with right knee effusion -Patient has questionable history of gout. -Patient underwent arthrocentesis -Synovial fluid showed WBC 15,350, predominant neutrophils -Joint fluid culture grew Streptococcus agalactiae sensitive to ceftriaxone -No incision and drainage needed as per orthopedics -Uric acid 4.4 CRP 33.9, ESR 45, rheumatoid factor 30.4 -Anti-CCP is pending, he will need rheumatology evaluation as outpatient -Ambulatory referral sent to Dr. Benjamine Mola  Left wrist pain/swelling -Orthopedics was going to consult hand surgery  Transaminitis -Patient had mildly elevated AST/ALT -Unclear etiology -Hepatitis panel unremarkable -Right upper quadrant ultrasound was unremarkable  Vitamin B12/folate deficiency -Vitamin B12 168; folate 5.0 -Continue vitamin B12 1000 mcg IM daily for 7 days -Continue folic acid 1 mg daily  Leukocytosis -Secondary to above -WBC improved to 17,000 today  Normocytic anemia -Likely from B12 and folate deficiency as above -Hemoglobin is stable at 12.2      Scheduled medications:    cyanocobalamin  1,000 mcg Intramuscular Daily   diclofenac Sodium  2 g Topical QID   folic acid  1 mg Oral Daily    morphine injection  4 mg Intravenous Once   sodium chloride flush  3 mL Intravenous Q12H         Data Reviewed:   CBG:  No results for input(s): GLUCAP in the last  168 hours.  SpO2: 98 %    Vitals:   11/10/20 2308 11/11/20 0443 11/11/20 0536 11/11/20 1148  BP: 136/88 (!) 146/101  133/86  Pulse: (!) 101  (!) 108 90  Resp: _0 Temp: 99.1 F (37.3 C) 98.3 F (36.8 C)  99.1 F (37.3 C)  TempSrc: Oral Oral  Oral  SpO2: 96% 97% 98% 98%  Weight:      Height:         Intake/Output Summary (Last 24 hours) at 11/11/2020 1314 Last data filed at 11/11/2020 0931 Gross per 24 hour  Intake 1353.69 ml  Output 450 ml  Net 903.69 ml    08/06 1901 - 08/08 0700 In: 1830.7 [P.O.:1200; I.V.:503.9] Out: 450 [Urine:450]  Filed Weights   11/08/20 0948 11/08/20 1949  Weight: 83 kg 82.8 kg    CBC:  Recent Labs  Lab 11/08/20 1005 11/09/20 0041 11/10/20 0036 11/11/20 0409  WBC 22.0* 26.7* 27.5* 17.2*  HGB 13.8 12.8* 11.5* 12.2*  HCT 41.4 38.9* 35.6* 38.0*  PLT 527* 543* 500* 530*  MCV 84.1 84.0 84.8 84.6  MCH 28.0 27.6 27.4 27.2  MCHC 33.3 32.9 32.3 32.1  RDW 13.7 13.4 13.5 13.4  LYMPHSABS 2.8  --   --   --   MONOABS 2.3*  --   --   --   EOSABS 0.1  --   --   --   BASOSABS 0.1  --   --   --     Complete metabolic panel:  Recent Labs  Lab 11/08/20 1005 11/08/20 1230 11/08/20 1734 11/09/20 0041 11/10/20 0036 11/11/20 0409  NA  --  137  --  131* 134* 132*  K  --  4.2  --  4.6 3.7 3.9  CL  --  103  --  100 100 100  CO2  --  24  --  _0 GLUCOSE  --  104*  --  143* 166* 110*  BUN  --  9  --  _1 CREATININE  --  0.66  --  0.74 0.71 0.76  CALCIUM  --  7.9*  --  8.5* 8.5* 8.4*  AST  --  59*  --  30 48* 42*  ALT  --  89*  --  72* 79* 89*  ALKPHOS  --  90  --  92 84 75  BILITOT  --  2.2*  --  1.2 0.7 0.8  ALBUMIN  --  2.1*  --  2.2* 2.2* 2.3*  CRP 33.9*  --   --   --   --   --   TSH  --   --  1.211  --   --   --     No results for input(s): LIPASE, AMYLASE in the last 168 hours.  Recent Labs  Lab 11/08/20 1005 11/08/20 1600  CRP 33.9*  --   SARSCOV2NAA  --  NEGATIVE     ------------------------------------------------------------------------------------------------------------------ No results for input(s): CHOL, HDL, LDLCALC, TRIG, CHOLHDL, LDLDIRECT in the last 72 hours.  Lab Results  Component Value Date   HGBA1C 5.6 04/19/2017   ------------------------------------------------------------------------------------------------------------------ Recent Labs    11/08/20 1734  TSH 1.211   ------------------------------------------------------------------------------------------------------------------ Recent Labs    11/10/20 0036  VITAMINB12 168*  FOLATE 5.0*  FERRITIN 2,654*  TIBC 206*  IRON 38*  RETICCTPCT 0.9    Coagulation profile No results for input(s): INR, PROTIME in the last 168 hours. No results for input(s): DDIMER in the last 72 hours.  Cardiac Enzymes Recent Labs  Lab 11/08/20 1230  CKTOTAL 51    ------------------------------------------------------------------------------------------------------------------ No results found for: BNP   Antibiotics: Anti-infectives (From admission, onward)    Start     Dose/Rate Route Frequency Ordered Stop   11/08/20 1630  cefTRIAXone (ROCEPHIN) 1 g in sodium chloride 0.9 % 100 mL IVPB        1 g 200 mL/hr over 30 Minutes Intravenous Every 24 hours 11/08/20 1628          Radiology Reports  MR WRIST LEFT WO CONTRAST  Result Date: 11/10/2020 CLINICAL DATA:  Wrist pain and swelling for 1 week. EXAM: MR OF THE LEFT WRIST WITHOUT CONTRAST TECHNIQUE: Multiplanar, multisequence MR imaging of the left wrist was performed. No intravenous contrast was administered. COMPARISON:  Radiographs 11/01/2020 FINDINGS: Ligaments: The scaphoid ligament is attenuated and likely torn. The scapholunate joint space is mildly widened. The lunotriquetral ligament is intact. Triangular fibrocartilage: Central tear of the TFCC with fluid in the radioulnar joint space. Tendons: Moderate scattered  tenosynovitis involving the dorsal wrist tendons and also the carpal tunnel tendons along with the flexor carpi radialis tendon outside the carpal tunnel. No tendon rupture. Carpal tunnel/median nerve: Fluid and synovitis in the carpal tunnel. The median nerve is unremarkable. Guyon's canal: No mass or mass effect on the ulnar nerve. Joint/cartilage: There is a large complex wrist joint effusion along with synovitis. There is also associated diffuse inflammation around the joint with subcutaneous inflammatory changes. I do not see any obvious full-thickness cartilage defects or discrete erosions. Bones/carpal alignment: Carpal alignment is normal. No findings suspicious for osteomyelitis. Other: The  hand and wrist musculature demonstrates areas of inflammation/myositis. No findings for pyomyositis. IMPRESSION: 1. Large complex wrist joint effusion along with synovitis and diffuse inflammation around the joint. Findings could be due to an inflammatory arthropathy or septic arthritis. Recommend joint aspiration. No definite MR findings to suggest osteomyelitis. 2. Moderate scattered tenosynovitis. 3. Central tear of the TFCC. 4. Suspect scapholunate ligament tear. Electronically Signed   By: Marijo Sanes M.D.   On: 11/10/2020 13:40   DG FLUORO GUIDED NEEDLE PLC ASPIRATION/INJECTION LOC  Result Date: 11/11/2020 CLINICAL DATA:  Left wrist swelling, warmth and leukocytosis with complex joint effusion on MRI. Positive cultures on recent knee aspiration. On antibiotics. FLUOROSCOPY TIME:  24 seconds of low-dose pulsed fluoroscopy. 0.2 mGy. PROCEDURE: LEFT RADIOCARPAL JOINT ASPIRATION UNDER FLUOROSCOPY COMPARISON:  MRI 11/10/2020. TECHNIQUE/FINDINGS: After reviewing the patient's recent MRI and chart, informed consent was obtained from the patient. The skin dorsal to the wrist was scrubbed with Betadine and draped in sterile fashion. Skin anesthesia was carried out using 1% Lidocaine. A 21 gauge needle was directed into  the radiocarpal joint under fluoroscopic guidance. Initially, only a small amount of serosanguineous fluid was aspirated (less than 1 cc). The needle tip was redirected several times without additional fluid being aspirated. Intra-articular position of the needle tip was confirmed by injecting a small amount of contrast in the joint. On injection of the joint, contrast is seen to extend through the previously demonstrated central tear of the triangular fibrocartilage complex. The joint was then localized with 5 cc of normal saline, and approximately 2 cc of this fluid was reaspirated and sent for the requested laboratory studies. IMPRESSION: Minimal serosanguineous fluid aspirated from the left radiocarpal joint. The joint was lavaged and some of this fluid was sent for the requested studies. Electronically Signed   By: Richardean Sale M.D.   On: 11/11/2020 11:20   US Abdomen Limited RUQ (LIVER/GB)  Result Date: 11/10/2020 CLINICAL DATA:  Transaminitis. EXAM: ULTRASOUND ABDOMEN LIMITED RIGHT UPPER QUADRANT COMPARISON:  CT of the abdomen and pelvis on 07/17/2018 FINDINGS: Gallbladder: Gallbladder has a normal appearance. Gallbladder wall is 2 millimeters, within normal limits. No stones or pericholecystic fluid. No sonographic Murphy's sign. Common bile duct: Diameter: 5 millimeters Liver: No focal lesion identified. Within normal limits in parenchymal echogenicity. Portal vein is patent on color Doppler imaging with normal direction of blood flow towards the liver. Other: None. IMPRESSION: Normal RIGHT UPPER QUADRANT ultrasound. Electronically Signed   By: Nolon Nations M.D.   On: 11/10/2020 12:34      DVT prophylaxis: Lovenox  Code Status: Full code  Family Communication: No family at bedside   Consultants: Orthopedics  Procedures: Aspiration of right knee joint    Objective    Physical Examination:   General-appears in no acute distress Heart-S1-S2, regular, no murmur  auscultated Lungs-clear to auscultation bilaterally, no wheezing or crackles auscultated Abdomen-soft, nontender, no organomegaly Extremities-no edema in the lower extremities Wrist-left wrist is edematous and tender to palpation, radial pulses palpable, sensations intact Neuro-alert, oriented x3, no focal deficit noted Status is: Inpatient  Dispo: The patient is from: Home              Anticipated d/c is to: Home              Anticipated d/c date is: 11/13/2020              Patient currently not stable for discharge  Barrier to discharge-ongoing management for right lower extremity cellulitis, left  wrist swelling  COVID-19 Labs  Recent Labs    11/10/20 0036  FERRITIN 2,654*    Lab Results  Component Value Date   SARSCOV2NAA NEGATIVE 11/08/2020   [place] Not Detected 03/30/2019   [place] Not Detected 03/29/2019    Microbiology  Recent Results (from the past 240 hour(s))  Body fluid culture w Gram Stain     Status: None   Collection Time: 11/08/20  2:04 PM   Specimen: Body Fluid  Result Value Ref Range Status   Specimen Description FLUID  Final   Special Requests SYNOVIAL RIGHT KNEE  Final   Gram Stain   Final    MODERATE WBC PRESENT, PREDOMINANTLY PMN NO ORGANISMS SEEN    Culture   Final    FEW STREPTOCOCCUS AGALACTIAE RESULT CALLED TO, READ BACK BY AND VERIFIED WITH: RN S.MEHAT ON [id number] AT 1245 BY E.PARRISH Performed at [facility], [place]. [address] [place], [place] [address]    Report Status 11/11/2020 FINAL  Final   Organism ID, Bacteria STREPTOCOCCUS AGALACTIAE  Final      Susceptibility   Streptococcus agalactiae - MIC*    CLINDAMYCIN RESISTANT Resistant     AMPICILLIN <=0.25 SENSITIVE Sensitive     ERYTHROMYCIN 2 RESISTANT Resistant     VANCOMYCIN 0.5 SENSITIVE Sensitive     CEFTRIAXONE <=0.12 SENSITIVE Sensitive     LEVOFLOXACIN >=16 RESISTANT Resistant     PENICILLIN Value in next row Sensitive      SENSITIVE<=0.06    * FEW  STREPTOCOCCUS AGALACTIAE  Resp Panel by RT-PCR (Flu A&B, Covid) Nasopharyngeal Swab     Status: None   Collection Time: 11/08/20  4:00 PM   Specimen: Nasopharyngeal Swab; Nasopharyngeal(NP) swabs in vial transport medium  Result Value Ref Range Status   SARS Coronavirus 2 by RT PCR NEGATIVE NEGATIVE Final    Comment: (NOTE) SARS-CoV-2 target nucleic acids are NOT DETECTED.  The SARS-CoV-2 RNA is generally detectable in upper respiratory specimens during the acute phase of infection. The lowest concentration of SARS-CoV-2 viral copies this assay can detect is 138 copies/mL. A negative result does not preclude SARS-Cov-2 infection and should not be used as the sole basis for treatment or other patient management decisions. A negative result may occur with  improper specimen collection/handling, submission of specimen other than nasopharyngeal swab, presence of viral mutation(s) within the areas targeted by this assay, and inadequate number of viral copies(<138 copies/mL). A negative result must be combined with clinical observations, patient history, and epidemiological information. The expected result is Negative.  Fact Sheet for Patients:  EntrepreneurPulse.com.au  Fact Sheet for Healthcare Providers:  IncredibleEmployment.be  This test is no t yet approved or cleared by the [place] FDA and  has been authorized for detection and/or diagnosis of SARS-CoV-2 by FDA under an Emergency Use Authorization (EUA). This EUA will remain  in effect (meaning this test can be used) for the duration of the COVID-19 declaration under Section 564(b)(1) of the Act, 21 U.S.C.section 360bbb-3(b)(1), unless the authorization is terminated  or revoked sooner.       Influenza A by PCR NEGATIVE NEGATIVE Final   Influenza B by PCR NEGATIVE NEGATIVE Final    Comment: (NOTE) The Xpert Xpress SARS-CoV-2/FLU/RSV plus assay is intended as an aid in the diagnosis  of influenza from Nasopharyngeal swab specimens and should not be used as a sole basis for treatment. Nasal washings and aspirates are unacceptable for Xpert Xpress SARS-CoV-2/FLU/RSV testing.  Fact Sheet for Patients: EntrepreneurPulse.com.au  Fact Sheet for Healthcare Providers: IncredibleEmployment.be  This test is not yet approved or cleared by the [place] FDA and has been authorized for detection and/or diagnosis of SARS-CoV-2 by FDA under an Emergency Use Authorization (EUA). This EUA will remain in effect (meaning this test can be used) for the duration of the COVID-19 declaration under Section 564(b)(1) of the Act, 21 U.S.C. section 360bbb-3(b)(1), unless the authorization is terminated or revoked.  Performed at [facility], [place] [address] [place], [place] [address]   Culture, blood (routine x 2)     Status: None (Preliminary result)   Collection Time: 11/08/20  8:54 PM   Specimen: BLOOD  Result Value Ref Range Status   Specimen Description BLOOD LEFT ANTECUBITAL  Final   Special Requests   Final    BOTTLES DRAWN AEROBIC AND ANAEROBIC Blood Culture adequate volume   Culture   Final    NO GROWTH 2 DAYS Performed at [facility], [place] [address], [place], [place] [address]    Report Status PENDING  Incomplete  Culture, blood (routine x 2)     Status: None (Preliminary result)   Collection Time: 11/08/20  8:59 PM   Specimen: BLOOD LEFT HAND  Result Value Ref Range Status   Specimen Description BLOOD LEFT HAND  Final   Special Requests   Final    BOTTLES DRAWN AEROBIC AND ANAEROBIC Blood Culture results may not be optimal due to an inadequate volume of blood received in culture bottles   Culture   Final    NO GROWTH 2 DAYS Performed at [facility], [place] [address], [place], [place] [address]    Report Status PENDING  Incomplete             Oswald Hillock   [facility] If 7PM-7AM, please  contact night-coverage at [email or web address], Office  [phone]   11/11/2020, 1:14 PM  LOS: 2 days

## 2020-11-12 LAB — COMPREHENSIVE METABOLIC PANEL
ALT: 102 U/L — ABNORMAL HIGH (ref 0–44)
AST: 68 U/L — ABNORMAL HIGH (ref 15–41)
Albumin: 2.4 g/dL — ABNORMAL LOW (ref 3.5–5.0)
Alkaline Phosphatase: 85 U/L (ref 38–126)
Anion gap: 10 (ref 5–15)
BUN: 13 mg/dL (ref 6–20)
CO2: 23 mmol/L (ref 22–32)
Calcium: 8.7 mg/dL — ABNORMAL LOW (ref 8.9–10.3)
Chloride: 96 mmol/L — ABNORMAL LOW (ref 98–111)
Creatinine, Ser: 0.81 mg/dL (ref 0.61–1.24)
GFR, Estimated: >60 mL/min (ref >60)
Glucose, Bld: 121 mg/dL — ABNORMAL HIGH (ref 70–99)
Potassium: 3.9 mmol/L (ref 3.5–5.1)
Sodium: 129 mmol/L — ABNORMAL LOW (ref 135–145)
Total Bilirubin: 0.7 mg/dL (ref 0.3–1.2)
Total Protein: 7.8 g/dL (ref 6.5–8.1)

## 2020-11-12 LAB — CYCLIC CITRUL PEPTIDE ANTIBODY, IGG/IGA: CCP Antibodies IgG/IgA: 102 units — ABNORMAL HIGH (ref 0–19)

## 2020-11-12 LAB — CBC
HCT: 39.4 % (ref 39.0–52.0)
Hemoglobin: 12.6 g/dL — ABNORMAL LOW (ref 13.0–17.0)
MCH: 27.3 pg (ref 26.0–34.0)
MCHC: 32 g/dL (ref 30.0–36.0)
MCV: 85.5 fL (ref 80.0–100.0)
Platelets: 543 10*3/uL — ABNORMAL HIGH (ref 150–400)
RBC: 4.61 MIL/uL (ref 4.22–5.81)
RDW: 13.3 % (ref 11.5–15.5)
WBC: 18.4 10*3/uL — ABNORMAL HIGH (ref 4.0–10.5)
nRBC: 0 % (ref 0.0–0.2)

## 2020-11-12 LAB — OSMOLALITY: Osmolality: 291 mOsm/kg (ref 275–295)

## 2020-11-12 NOTE — Progress Notes (Addendum)
[facility]  PROGRESS NOTE  Juan Tanner SEG:[id number] DOB: 1965/11/05 DOA: 11/08/2020 PCP: Pcp, No   Brief HPI:   55 year old male with medical history of GERD, gout presents with right knee swelling and swelling of left wrist.    Subjective   Recurrent seen and examined, MRI of the left wrist done yesterday showed large complex wrist joint effusion along with synovitis and diffuse inflammation along the joint.  No definite findings to suggest osteomyelitis.   Assessment/Plan:    Cellulitis of right lower extremity -Significantly improved with IV antibiotics -Venous duplex was negative for DVT -Edema has improved  Polyarthritis with right knee effusion -Patient has questionable history of gout. -Patient underwent arthrocentesis -Synovial fluid showed WBC 15,350, predominant neutrophils -Joint fluid culture grew Streptococcus agalactiae sensitive to ceftriaxone -No incision and drainage needed as per orthopedics -Uric acid 4.4 CRP 33.9, ESR 45, rheumatoid factor 30.4 -Anti-CCP is pending, he will need rheumatology evaluation as outpatient -Ambulatory referral sent to Dr. Benjamine Mola  Left wrist pain/swelling -Orthopedics ordered MRI of the left wrist yesterday -MRI showed large complex restart effusion along with synovitis and diffuse inflammation of the joint -No findings to suggest osteomyelitis -Patient underwent aspiration of left wrist per IR yesterday -Joint fluid analysis shows no crystals,she was 51 WBC, 98% neutrophils  Transaminitis -Patient had mildly elevated AST/ALT -Unclear etiology -Hepatitis panel unremarkable -Right upper quadrant ultrasound was unremarkable  Vitamin B12/folate deficiency -Vitamin B12 168; folate 5.0 -Continue vitamin B12 1000 mcg IM daily for 7 days -Continue folic acid 1 mg daily  Leukocytosis -Secondary to above -WBC is 18,000  Normocytic anemia -Likely from B12 and folate deficiency as above -Hemoglobin is stable at  12.2   Hyponatremia -Sodium is 129 today; unclear etiology -Check serum osmolality     Scheduled medications:    cyanocobalamin  1,000 mcg Intramuscular Daily   diclofenac Sodium  2 g Topical QID   folic acid  1 mg Oral Daily    morphine injection  4 mg Intravenous Once   sodium chloride flush  3 mL Intravenous Q12H         Data Reviewed:   CBG:  No results for input(s): GLUCAP in the last 168 hours.  SpO2: 100 %    Vitals:   11/11/20 1715 11/11/20 2259 11/12/20 0415 11/12/20 1020  BP: (!) 133/92 122/87 (!) 131/101 124/88  Pulse: 96 (!) 109 (!) 109 100  Resp: 18 20 20 20   Temp: 98.9 F (37.2 C) 99.3 F (37.4 C) 98.6 F (37 C) 98.9 F (37.2 C)  TempSrc: Oral Oral Oral Oral  SpO2: 98% 96% 95% 100%  Weight:      Height:         Intake/Output Summary (Last 24 hours) at 11/12/2020 1218 Last data filed at 11/12/2020 0850 Gross per 24 hour  Intake 480 ml  Output --  Net 480 ml    08/07 1901 - 08/09 0700 In: 1009.8 [P.O.:480; I.V.:503] Out: 450 [Urine:450]  Filed Weights   11/08/20 0948 11/08/20 1949  Weight: 83 kg 82.8 kg    CBC:  Recent Labs  Lab 11/08/20 1005 11/09/20 0041 11/10/20 0036 11/11/20 0409 11/12/20 0212  WBC 22.0* 26.7* 27.5* 17.2* 18.4*  HGB 13.8 12.8* 11.5* 12.2* 12.6*  HCT 41.4 38.9* 35.6* 38.0* 39.4  PLT 527* 543* 500* 530* 543*  MCV 84.1 84.0 84.8 84.6 85.5  MCH 28.0 27.6 27.4 27.2 27.3  MCHC 33.3 32.9 32.3 32.1 32.0  RDW 13.7 13.4 13.5 13.4 13.3  LYMPHSABS  2.8  --   --   --   --   MONOABS 2.3*  --   --   --   --   EOSABS 0.1  --   --   --   --   BASOSABS 0.1  --   --   --   --     Complete metabolic panel:  Recent Labs  Lab 11/08/20 1005 11/08/20 1230 11/08/20 1734 11/09/20 0041 11/10/20 0036 11/11/20 0409 11/12/20 0212  NA  --  137  --  131* 134* 132* 129*  K  --  4.2  --  4.6 3.7 3.9 3.9  CL  --  103  --  100 100 100 96*  CO2  --  24  --  23 24 22 23   GLUCOSE  --  104*  --  143* 166* 110* 121*  BUN  --   9  --  8 15 14 13   CREATININE  --  0.66  --  0.74 0.71 0.76 0.81  CALCIUM  --  7.9*  --  8.5* 8.5* 8.4* 8.7*  AST  --  59*  --  30 48* 42* 68*  ALT  --  89*  --  72* 79* 89* 102*  ALKPHOS  --  90  --  92 84 75 85  BILITOT  --  2.2*  --  1.2 0.7 0.8 0.7  ALBUMIN  --  2.1*  --  2.2* 2.2* 2.3* 2.4*  CRP 33.9*  --   --   --   --   --   --   TSH  --   --  1.211  --   --   --   --     No results for input(s): LIPASE, AMYLASE in the last 168 hours.  Recent Labs  Lab 11/08/20 1005 11/08/20 1600  CRP 33.9*  --   SARSCOV2NAA  --  NEGATIVE    ------------------------------------------------------------------------------------------------------------------ No results for input(s): CHOL, HDL, LDLCALC, TRIG, CHOLHDL, LDLDIRECT in the last 72 hours.  Lab Results  Component Value Date   HGBA1C 5.6 04/19/2017   ------------------------------------------------------------------------------------------------------------------ No results for input(s): TSH, T4TOTAL, T3FREE, THYROIDAB in the last 72 hours.  Invalid input(s): FREET3  ------------------------------------------------------------------------------------------------------------------ Recent Labs    11/10/20 0036  VITAMINB12 168*  FOLATE 5.0*  FERRITIN 2,654*  TIBC 206*  IRON 38*  RETICCTPCT 0.9    Coagulation profile No results for input(s): INR, PROTIME in the last 168 hours. No results for input(s): DDIMER in the last 72 hours.  Cardiac Enzymes Recent Labs  Lab 11/08/20 1230  CKTOTAL 51    ------------------------------------------------------------------------------------------------------------------ No results found for: BNP   Antibiotics: Anti-infectives (From admission, onward)    Start     Dose/Rate Route Frequency Ordered Stop   11/08/20 1630  cefTRIAXone (ROCEPHIN) 1 g in sodium chloride 0.9 % 100 mL IVPB        1 g 200 mL/hr over 30 Minutes Intravenous Every 24 hours 11/08/20 1628           Radiology Reports  MR WRIST LEFT WO CONTRAST  Result Date: 11/10/2020 CLINICAL DATA:  Wrist pain and swelling for 1 week. EXAM: MR OF THE LEFT WRIST WITHOUT CONTRAST TECHNIQUE: Multiplanar, multisequence MR imaging of the left wrist was performed. No intravenous contrast was administered. COMPARISON:  Radiographs 11/01/2020 FINDINGS: Ligaments: The scaphoid ligament is attenuated and likely torn. The scapholunate joint space is mildly widened. The lunotriquetral ligament is intact. Triangular fibrocartilage: Central tear of the  TFCC with fluid in the radioulnar joint space. Tendons: Moderate scattered tenosynovitis involving the dorsal wrist tendons and also the carpal tunnel tendons along with the flexor carpi radialis tendon outside the carpal tunnel. No tendon rupture. Carpal tunnel/median nerve: Fluid and synovitis in the carpal tunnel. The median nerve is unremarkable. Guyon's canal: No mass or mass effect on the ulnar nerve. Joint/cartilage: There is a large complex wrist joint effusion along with synovitis. There is also associated diffuse inflammation around the joint with subcutaneous inflammatory changes. I do not see any obvious full-thickness cartilage defects or discrete erosions. Bones/carpal alignment: Carpal alignment is normal. No findings suspicious for osteomyelitis. Other: The hand and wrist musculature demonstrates areas of inflammation/myositis. No findings for pyomyositis. IMPRESSION: 1. Large complex wrist joint effusion along with synovitis and diffuse inflammation around the joint. Findings could be due to an inflammatory arthropathy or septic arthritis. Recommend joint aspiration. No definite MR findings to suggest osteomyelitis. 2. Moderate scattered tenosynovitis. 3. Central tear of the TFCC. 4. Suspect scapholunate ligament tear. Electronically Signed   By: Marijo Sanes M.D.   On: 11/10/2020 13:40   DG FLUORO GUIDED NEEDLE PLC ASPIRATION/INJECTION LOC  Result Date:  11/11/2020 CLINICAL DATA:  Left wrist swelling, warmth and leukocytosis with complex joint effusion on MRI. Positive cultures on recent knee aspiration. On antibiotics. FLUOROSCOPY TIME:  24 seconds of low-dose pulsed fluoroscopy. 0.2 mGy. PROCEDURE: LEFT RADIOCARPAL JOINT ASPIRATION UNDER FLUOROSCOPY COMPARISON:  MRI 11/10/2020. TECHNIQUE/FINDINGS: After reviewing the patient's recent MRI and chart, informed consent was obtained from the patient. The skin dorsal to the wrist was scrubbed with Betadine and draped in sterile fashion. Skin anesthesia was carried out using 1% Lidocaine. A 21 gauge needle was directed into the radiocarpal joint under fluoroscopic guidance. Initially, only a small amount of serosanguineous fluid was aspirated (less than 1 cc). The needle tip was redirected several times without additional fluid being aspirated. Intra-articular position of the needle tip was confirmed by injecting a small amount of contrast in the joint. On injection of the joint, contrast is seen to extend through the previously demonstrated central tear of the triangular fibrocartilage complex. The joint was then localized with 5 cc of normal saline, and approximately 2 cc of this fluid was reaspirated and sent for the requested laboratory studies. IMPRESSION: Minimal serosanguineous fluid aspirated from the left radiocarpal joint. The joint was lavaged and some of this fluid was sent for the requested studies. Electronically Signed   By: Richardean Sale M.D.   On: 11/11/2020 11:20   US Abdomen Limited RUQ (LIVER/GB)  Result Date: 11/10/2020 CLINICAL DATA:  Transaminitis. EXAM: ULTRASOUND ABDOMEN LIMITED RIGHT UPPER QUADRANT COMPARISON:  CT of the abdomen and pelvis on 07/17/2018 FINDINGS: Gallbladder: Gallbladder has a normal appearance. Gallbladder wall is 2 millimeters, within normal limits. No stones or pericholecystic fluid. No sonographic Murphy's sign. Common bile duct: Diameter: 5 millimeters Liver: No focal  lesion identified. Within normal limits in parenchymal echogenicity. Portal vein is patent on color Doppler imaging with normal direction of blood flow towards the liver. Other: None. IMPRESSION: Normal RIGHT UPPER QUADRANT ultrasound. Electronically Signed   By: Nolon Nations M.D.   On: 11/10/2020 12:34      DVT prophylaxis: Lovenox  Code Status: Full code  Family Communication: No family at bedside   Consultants: Orthopedics  Procedures: Aspiration of right knee joint    Objective    Physical Examination:  General-appears in no acute distress Heart-S1-S2, regular, no murmur auscultated Lungs-clear to auscultation  bilaterally, no wheezing or crackles auscultated Abdomen-soft, nontender, no organomegaly Extremities-no edema in the lower extremities Wrist-left wrist is edematous, sensations intact, tender to palpation Neuro-alert, oriented x3, no focal deficit noted  Status is: Inpatient  Dispo: The patient is from: Home              Anticipated d/c is to: Home              Anticipated d/c date is: 11/15/2020              Patient currently not stable for discharge  Barrier to discharge-ongoing management for right lower extremity cellulitis, left wrist swelling  COVID-19 Labs  Recent Labs    11/10/20 0036  FERRITIN 2,654*    Lab Results  Component Value Date   [place] NEGATIVE 11/08/2020   [place] Not Detected 03/30/2019   [place] Not Detected 03/29/2019    Microbiology  Recent Results (from the past 240 hour(s))  Body fluid culture w Gram Stain     Status: None   Collection Time: 11/08/20  2:04 PM   Specimen: Body Fluid  Result Value Ref Range Status   Specimen Description FLUID  Final   Special Requests SYNOVIAL RIGHT KNEE  Final   Gram Stain   Final    MODERATE WBC PRESENT, PREDOMINANTLY PMN NO ORGANISMS SEEN    Culture   Final    FEW STREPTOCOCCUS AGALACTIAE RESULT CALLED TO, READ BACK BY AND VERIFIED WITH: RN S.MEHAT ON [id number]  AT 1245 BY E.PARRISH Performed at [facility], [place]. [address], [place], [place] [address]    Report Status 11/11/2020 FINAL  Final   Organism ID, Bacteria STREPTOCOCCUS AGALACTIAE  Final      Susceptibility   Streptococcus agalactiae - MIC*    CLINDAMYCIN RESISTANT Resistant     AMPICILLIN <=0.25 SENSITIVE Sensitive     ERYTHROMYCIN 2 RESISTANT Resistant     VANCOMYCIN 0.5 SENSITIVE Sensitive     CEFTRIAXONE <=0.12 SENSITIVE Sensitive     LEVOFLOXACIN >=16 RESISTANT Resistant     PENICILLIN Value in next row Sensitive      SENSITIVE<=0.06    * FEW STREPTOCOCCUS AGALACTIAE  Resp Panel by RT-PCR (Flu A&B, Covid) Nasopharyngeal Swab     Status: None   Collection Time: 11/08/20  4:00 PM   Specimen: Nasopharyngeal Swab; Nasopharyngeal(NP) swabs in vial transport medium  Result Value Ref Range Status   SARS Coronavirus 2 by RT PCR NEGATIVE NEGATIVE Final    Comment: (NOTE) SARS-CoV-2 target nucleic acids are NOT DETECTED.  The SARS-CoV-2 RNA is generally detectable in upper respiratory specimens during the acute phase of infection. The lowest concentration of SARS-CoV-2 viral copies this assay can detect is 138 copies/mL. A negative result does not preclude SARS-Cov-2 infection and should not be used as the sole basis for treatment or other patient management decisions. A negative result may occur with  improper specimen collection/handling, submission of specimen other than nasopharyngeal swab, presence of viral mutation(s) within the areas targeted by this assay, and inadequate number of viral copies(<138 copies/mL). A negative result must be combined with clinical observations, patient history, and epidemiological information. The expected result is Negative.  Fact Sheet for Patients:  EntrepreneurPulse.com.au  Fact Sheet for Healthcare Providers:  IncredibleEmployment.be  This test is no t yet approved or cleared by the [place]  FDA and  has been authorized for detection and/or diagnosis of SARS-CoV-2 by FDA under an Emergency Use Authorization (EUA). This EUA will remain  in effect (meaning this test can be used) for the duration of the COVID-19 declaration under Section 564(b)(1) of the Act, 21 U.S.C.section 360bbb-3(b)(1), unless the authorization is terminated  or revoked sooner.       Influenza A by PCR NEGATIVE NEGATIVE Final   Influenza B by PCR NEGATIVE NEGATIVE Final    Comment: (NOTE) The Xpert Xpress SARS-CoV-2/FLU/RSV plus assay is intended as an aid in the diagnosis of influenza from Nasopharyngeal swab specimens and should not be used as a sole basis for treatment. Nasal washings and aspirates are unacceptable for Xpert Xpress SARS-CoV-2/FLU/RSV testing.  Fact Sheet for Patients: EntrepreneurPulse.com.au  Fact Sheet for Healthcare Providers: IncredibleEmployment.be  This test is not yet approved or cleared by the [place] FDA and has been authorized for detection and/or diagnosis of SARS-CoV-2 by FDA under an Emergency Use Authorization (EUA). This EUA will remain in effect (meaning this test can be used) for the duration of the COVID-19 declaration under Section 564(b)(1) of the Act, 21 U.S.C. section 360bbb-3(b)(1), unless the authorization is terminated or revoked.  Performed at [facility], [place] [address], [place], [place] [address]   Culture, blood (routine x 2)     Status: None (Preliminary result)   Collection Time: 11/08/20  8:54 PM   Specimen: BLOOD  Result Value Ref Range Status   Specimen Description BLOOD LEFT ANTECUBITAL  Final   Special Requests   Final    BOTTLES DRAWN AEROBIC AND ANAEROBIC Blood Culture adequate volume   Culture   Final    NO GROWTH 4 DAYS Performed at [facility], [place] [address] [place], [place] [address]    Report Status PENDING  Incomplete  Culture, blood (routine x 2)     Status: None  (Preliminary result)   Collection Time: 11/08/20  8:59 PM   Specimen: BLOOD LEFT HAND  Result Value Ref Range Status   Specimen Description BLOOD LEFT HAND  Final   Special Requests   Final    BOTTLES DRAWN AEROBIC AND ANAEROBIC Blood Culture results may not be optimal due to an inadequate volume of blood received in culture bottles   Culture   Final    NO GROWTH 4 DAYS Performed at [facility], [place] [address], [place], [place] [address]    Report Status PENDING  Incomplete             Oswald Hillock   [facility] If 7PM-7AM, please contact night-coverage at [email or web address], Office  [phone]   11/12/2020, 12:18 PM  LOS: 3 days

## 2020-11-12 NOTE — Plan of Care (Signed)
  Problem: Education: Goal: Knowledge of General Education information will improve Description Including pain rating scale, medication(s)/side effects and non-pharmacologic comfort measures Outcome: Progressing   

## 2020-11-12 NOTE — Progress Notes (Signed)
Patient c/o worsening swelling and numbness of his left arm from elbow to fingers, he is able to wiggle all left fingers but unable to move arm. MD notified.

## 2020-11-13 LAB — COMPREHENSIVE METABOLIC PANEL
ALT: 95 U/L — ABNORMAL HIGH (ref 0–44)
AST: 48 U/L — ABNORMAL HIGH (ref 15–41)
Albumin: 2.3 g/dL — ABNORMAL LOW (ref 3.5–5.0)
Alkaline Phosphatase: 91 U/L (ref 38–126)
Anion gap: 8 (ref 5–15)
BUN: 13 mg/dL (ref 6–20)
CO2: 25 mmol/L (ref 22–32)
Calcium: 8.8 mg/dL — ABNORMAL LOW (ref 8.9–10.3)
Chloride: 100 mmol/L (ref 98–111)
Creatinine, Ser: 0.89 mg/dL (ref 0.61–1.24)
GFR, Estimated: >60 mL/min (ref >60)
Glucose, Bld: 119 mg/dL — ABNORMAL HIGH (ref 70–99)
Potassium: 4 mmol/L (ref 3.5–5.1)
Sodium: 133 mmol/L — ABNORMAL LOW (ref 135–145)
Total Bilirubin: 1 mg/dL (ref 0.3–1.2)
Total Protein: 7.8 g/dL (ref 6.5–8.1)

## 2020-11-13 LAB — CBC
HCT: 37.2 % — ABNORMAL LOW (ref 39.0–52.0)
Hemoglobin: 11.8 g/dL — ABNORMAL LOW (ref 13.0–17.0)
MCH: 27 pg (ref 26.0–34.0)
MCHC: 31.7 g/dL (ref 30.0–36.0)
MCV: 85.1 fL (ref 80.0–100.0)
Platelets: 536 10*3/uL — ABNORMAL HIGH (ref 150–400)
RBC: 4.37 MIL/uL (ref 4.22–5.81)
RDW: 13.2 % (ref 11.5–15.5)
WBC: 16.5 10*3/uL — ABNORMAL HIGH (ref 4.0–10.5)
nRBC: 0 % (ref 0.0–0.2)

## 2020-11-13 LAB — CULTURE, BLOOD (ROUTINE X 2)
Culture: NO GROWTH
Culture: NO GROWTH
Special Requests: ADEQUATE

## 2020-11-13 MED ORDER — HEPARIN SODIUM (PORCINE) 5000 UNIT/ML IJ SOLN
5000.0000 [IU] | Freq: Three times a day (TID) | INTRAMUSCULAR | Status: DC
  Administered 2020-11-13 – 2020-11-19 (×19): 5000 [IU] via SUBCUTANEOUS
  Filled 2020-11-13 (×19): qty 1

## 2020-11-13 MED ORDER — OXYCODONE HCL 5 MG PO TABS
5.0000 mg | ORAL_TABLET | ORAL | Status: DC | PRN
  Administered 2020-11-13 (×2): 10 mg via ORAL
  Administered 2020-11-14 (×3): 5 mg via ORAL
  Administered 2020-11-15: 10 mg via ORAL
  Filled 2020-11-13: qty 1
  Filled 2020-11-13 (×3): qty 2
  Filled 2020-11-13 (×2): qty 1

## 2020-11-13 MED ORDER — COLCHICINE 0.6 MG PO TABS
0.6000 mg | ORAL_TABLET | Freq: Two times a day (BID) | ORAL | Status: DC
  Administered 2020-11-14 – 2020-11-15 (×3): 0.6 mg via ORAL
  Filled 2020-11-13 (×3): qty 1

## 2020-11-13 MED ORDER — COLCHICINE 0.6 MG PO TABS
1.2000 mg | ORAL_TABLET | Freq: Once | ORAL | Status: AC
  Administered 2020-11-13: 1.2 mg via ORAL
  Filled 2020-11-13: qty 2

## 2020-11-13 NOTE — Progress Notes (Signed)
PROGRESS NOTE    Juan Tanner  EWY:[id number] DOB: 05/24/1965 DOA: 11/08/2020 PCP: Pcp, No    Chief Complaint  Patient presents with   Leg Pain   Hand Pain   Flank Pain    Brief Narrative:    55 year old male with medical history of GERD, gout presents with right knee swelling and swelling of left wrist. MRI of the left wrist done yesterday showed large complex wrist joint effusion along with synovitis and diffuse inflammation along the joint.  No definite findings to suggest osteomyelitis.  He underwent right knee aspiration of the right knee growing streptococcus agalactiae sensitive to rocephin.   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  Synovial fluid cultures from the right knee growing streptococcus agalactiae sensitive to rocephin.  Uric acid wnl. RF is 30.4.  Elevated CRP and ESR. CCP Antibodies IgG/IgA elevated at 102.   Transaminitis:  Mild, unclear etiology.  RUQ unremarkable.  Hepatitis panel is negative.    Vitamin b12 and folate deficiency:  - replacements ordered.    Leukocytosis:  Improving with IV rocephin.  Monitor.    Anemia of chronic disease;  Monitor H&H   Hyponatremia:  Improving.   GERD Stable.     DVT prophylaxis: heparin.  Code Status: Full code.  Family Communication: none at bedside.  Disposition:   Status is: Inpatient  Remains inpatient appropriate because:Ongoing diagnostic testing needed not appropriate for outpatient work up, Unsafe d/c plan, and IV treatments appropriate due to intensity of illness or inability to take PO  Dispo:  Patient From: Home  Planned Disposition: Home  Medically stable for discharge: No         Consultants:  Orthopedics.    Procedures: Aspiration of the right knee.   Antimicrobials:  Antibiotics Given (last 72 hours)     Date/Time Action Medication Dose Rate   11/10/20 1653 New Bag/Given   cefTRIAXone (ROCEPHIN) 1 g in sodium chloride 0.9 % 100 mL IVPB 1 g 200 mL/hr   11/11/20 1707 New Bag/Given   cefTRIAXone (ROCEPHIN) 1 g in sodium chloride 0.9 % 100 mL IVPB 1 g 200 mL/hr   11/12/20 1430 New Bag/Given   cefTRIAXone (ROCEPHIN) 1 g in sodium chloride 0.9 % 100 mL IVPB 1 g 200 mL/hr        Subjective: No chest pain or sob.  Reports worsening pain and swelling of the left wrist  Objective: Vitals:   11/13/20 0108 11/13/20 0517 11/13/20 0840 11/13/20 1222  BP: 119/88 132/86 (!) 150/96 127/76  Pulse: (!) 105 (!) 104 (!) 110 (!) 110  Resp: _0 Temp: 98.6 F (37 C) 98.5 F (36.9 C) (!) 97.5 F (36.4 C) 98.1 F (36.7 C)  TempSrc: Oral Oral Oral Oral  SpO2: 98% 97% 100% 100%  Weight:      Height:        Intake/Output Summary (Last 24 hours) at 11/13/2020 1247 Last data filed at 11/13/2020 0100 Gross per 24 hour  Intake 493 ml  Output --  Net 493 ml   Filed Weights   11/08/20 0948 11/08/20 1949  Weight: 83 kg 82.8 kg    Examination:  General exam: Appears calm and comfortable  Respiratory system:  Clear to auscultation. Respiratory effort normal. Cardiovascular system: S1 & S2 heard, RRR. No JVD, murmurs, No pedal edema. Gastrointestinal system: Abdomen is nondistended, soft and nontender. Normal bowel sounds heard. Central nervous system: Alert and oriented. No focal neurological deficits. Extremities: right knee swelling and left wrist swelling , with tenderness. Painful ROM of the left wrist.  Skin: No rashes, lesions or ulcers Psychiatry: Mood & affect appropriate.     Data Reviewed: I have personally reviewed following labs and imaging studies  CBC: Recent Labs  Lab 11/08/20 1005 11/09/20 0041 11/10/20 0036 11/11/20 0409 11/12/20 0212 11/13/20 0310  WBC  22.0* 26.7* 27.5* 17.2* 18.4* 16.5*  NEUTROABS 15.7*  --   --   --   --   --   HGB 13.8 12.8* 11.5* 12.2* 12.6* 11.8*  HCT 41.4 38.9* 35.6* 38.0* 39.4 37.2*  MCV 84.1 84.0 84.8 84.6 85.5 85.1  PLT 527* 543* 500* 530* 543* 536*    Basic Metabolic Panel: Recent Labs  Lab 11/09/20 0041 11/10/20 0036 11/11/20 0409 11/12/20 0212 11/13/20 0310  NA 131* 134* 132* 129* 133*  K 4.6 3.7 3.9 3.9 4.0  CL 100 100 100 96* 100  CO2 _0 GLUCOSE 143* 166* 110* 121* 119*  BUN _1 CREATININE 0.74 0.71 0.76 0.81 0.89  CALCIUM 8.5* 8.5* 8.4* 8.7* 8.8*    GFR: Estimated Creatinine Clearance: 93.9 mL/min (by C-G formula based on SCr of 0.89 mg/dL).  Liver Function Tests: Recent Labs  Lab 11/09/20 0041 11/10/20 0036 11/11/20 0409 11/12/20 0212 11/13/20 0310  AST 30 48* 42* 68* 48*  ALT 72* 79* 89* 102* 95*  ALKPHOS 92 84 75 85 91  BILITOT 1.2 0.7 0.8 0.7 1.0  PROT 7.3 7.5 7.8 7.8 7.8  ALBUMIN 2.2* 2.2* 2.3* 2.4* 2.3*    CBG: No results for input(s): GLUCAP in the last 168 hours.   Recent Results (from the past 240 hour(s))  Body fluid culture w Gram Stain     Status: None   Collection Time: 11/08/20  2:04 PM   Specimen: Body Fluid  Result Value Ref Range Status   Specimen Description FLUID  Final   Special Requests SYNOVIAL RIGHT KNEE  Final   Gram Stain   Final    MODERATE WBC PRESENT, PREDOMINANTLY PMN NO ORGANISMS SEEN    Culture   Final    FEW STREPTOCOCCUS AGALACTIAE RESULT CALLED TO, READ BACK BY AND VERIFIED WITH: RN S.MEHAT ON [id number] AT 1245 BY E.PARRISH Performed at [facility], [place]. [address] [place], [place] [address]    Report Status 11/11/2020 FINAL  Final   Organism ID, Bacteria STREPTOCOCCUS AGALACTIAE  Final      Susceptibility   Streptococcus agalactiae - MIC*    CLINDAMYCIN RESISTANT Resistant     AMPICILLIN <=0.25 SENSITIVE Sensitive     ERYTHROMYCIN 2 RESISTANT Resistant     VANCOMYCIN 0.5 SENSITIVE Sensitive      CEFTRIAXONE <=0.12 SENSITIVE Sensitive     LEVOFLOXACIN >=16 RESISTANT Resistant     PENICILLIN Value in next row Sensitive      SENSITIVE<=0.06    * FEW STREPTOCOCCUS AGALACTIAE  Resp Panel by RT-PCR (Flu A&B, Covid) Nasopharyngeal Swab     Status: None   Collection Time: 11/08/20  4:00 PM   Specimen: Nasopharyngeal Swab; Nasopharyngeal(NP) swabs in vial transport medium  Result Value Ref Range Status   SARS Coronavirus 2 by RT PCR NEGATIVE NEGATIVE Final  Comment: (NOTE) SARS-CoV-2 target nucleic acids are NOT DETECTED.  The SARS-CoV-2 RNA is generally detectable in upper respiratory specimens during the acute phase of infection. The lowest concentration of SARS-CoV-2 viral copies this assay can detect is 138 copies/mL. A negative result does not preclude SARS-Cov-2 infection and should not be used as the sole basis for treatment or other patient management decisions. A negative result may occur with  improper specimen collection/handling, submission of specimen other than nasopharyngeal swab, presence of viral mutation(s) within the areas targeted by this assay, and inadequate number of viral copies(<138 copies/mL). A negative result must be combined with clinical observations, patient history, and epidemiological information. The expected result is Negative.  Fact Sheet for Patients:  EntrepreneurPulse.com.au  Fact Sheet for Healthcare Providers:  IncredibleEmployment.be  This test is no t yet approved or cleared by the [place] FDA and  has been authorized for detection and/or diagnosis of SARS-CoV-2 by FDA under an Emergency Use Authorization (EUA). This EUA will remain  in effect (meaning this test can be used) for the duration of the COVID-19 declaration under Section 564(b)(1) of the Act, 21 U.S.C.section 360bbb-3(b)(1), unless the authorization is terminated  or revoked sooner.       Influenza A by PCR NEGATIVE NEGATIVE  Final   Influenza B by PCR NEGATIVE NEGATIVE Final    Comment: (NOTE) The Xpert Xpress SARS-CoV-2/FLU/RSV plus assay is intended as an aid in the diagnosis of influenza from Nasopharyngeal swab specimens and should not be used as a sole basis for treatment. Nasal washings and aspirates are unacceptable for Xpert Xpress SARS-CoV-2/FLU/RSV testing.  Fact Sheet for Patients: EntrepreneurPulse.com.au  Fact Sheet for Healthcare Providers: IncredibleEmployment.be  This test is not yet approved or cleared by the [place] FDA and has been authorized for detection and/or diagnosis of SARS-CoV-2 by FDA under an Emergency Use Authorization (EUA). This EUA will remain in effect (meaning this test can be used) for the duration of the COVID-19 declaration under Section 564(b)(1) of the Act, 21 U.S.C. section 360bbb-3(b)(1), unless the authorization is terminated or revoked.  Performed at [facility], [place] [address], [place], [place] [address]   Culture, blood (routine x 2)     Status: None   Collection Time: 11/08/20  8:54 PM   Specimen: BLOOD  Result Value Ref Range Status   Specimen Description BLOOD LEFT ANTECUBITAL  Final   Special Requests   Final    BOTTLES DRAWN AEROBIC AND ANAEROBIC Blood Culture adequate volume   Culture   Final    NO GROWTH 5 DAYS Performed at [facility], [place] [address], [place], [place] [address]    Report Status 11/13/2020 FINAL  Final  Culture, blood (routine x 2)     Status: None   Collection Time: 11/08/20  8:59 PM   Specimen: BLOOD LEFT HAND  Result Value Ref Range Status   Specimen Description BLOOD LEFT HAND  Final   Special Requests   Final    BOTTLES DRAWN AEROBIC AND ANAEROBIC Blood Culture results may not be optimal due to an inadequate volume of blood received in culture bottles   Culture   Final    NO GROWTH 5 DAYS Performed at [facility], [place] [address] [place], [place]  [address]    Report Status 11/13/2020 FINAL  Final         Radiology Studies: No results found.      Scheduled Meds:  cyanocobalamin  1,000 mcg Intramuscular  Daily   diclofenac Sodium  2 g Topical QID   folic acid  1 mg Oral Daily    morphine injection  4 mg Intravenous Once   sodium chloride flush  3 mL Intravenous Q12H   Continuous Infusions:  sodium chloride     cefTRIAXone (ROCEPHIN)  IV 1 g (11/12/20 1430)     LOS: 4 days        Hosie Poisson, MD [facility]   To contact the attending provider between 7A-7P or the covering provider during after hours 7P-7A, please log into the web site [email or web address] and access using universal [place] password for that web site. If you do not have the password, please call the hospital operator.  11/13/2020, 12:47 PM

## 2020-11-13 NOTE — Progress Notes (Signed)
Pt called complaining of feeling like his heart was racing and he was feeling SOB. Denied chest pain. EKG obtained and vitals taken. Pt pulse ox reading 100% on room air, and blood pressure WNL. Pt appeared to be sinus tachy (120-130 bpm) on cardiac monitor. Pt stated this event occurred when he was using the bathroom and symptoms resolved by the time he sat back down in bed. MEWS Yellow implemented due to patient's elevated HR. Dr. Arville Care made aware. No new orders. PRN meds given for leg pain.

## 2020-11-14 ENCOUNTER — Inpatient Hospital Stay: Payer: Self-pay

## 2020-11-14 DIAGNOSIS — L03115 Cellulitis of right lower limb: Secondary | ICD-10-CM

## 2020-11-14 LAB — CBC
HCT: 34.4 % — ABNORMAL LOW (ref 39.0–52.0)
Hemoglobin: 10.9 g/dL — ABNORMAL LOW (ref 13.0–17.0)
MCH: 27 pg (ref 26.0–34.0)
MCHC: 31.7 g/dL (ref 30.0–36.0)
MCV: 85.4 fL (ref 80.0–100.0)
Platelets: 535 10*3/uL — ABNORMAL HIGH (ref 150–400)
RBC: 4.03 MIL/uL — ABNORMAL LOW (ref 4.22–5.81)
RDW: 13 % (ref 11.5–15.5)
WBC: 14.7 10*3/uL — ABNORMAL HIGH (ref 4.0–10.5)
nRBC: 0 % (ref 0.0–0.2)

## 2020-11-14 LAB — COMPREHENSIVE METABOLIC PANEL
ALT: 131 U/L — ABNORMAL HIGH (ref 0–44)
AST: 81 U/L — ABNORMAL HIGH (ref 15–41)
Albumin: 2.3 g/dL — ABNORMAL LOW (ref 3.5–5.0)
Alkaline Phosphatase: 96 U/L (ref 38–126)
Anion gap: 9 (ref 5–15)
BUN: 10 mg/dL (ref 6–20)
CO2: 26 mmol/L (ref 22–32)
Calcium: 8.6 mg/dL — ABNORMAL LOW (ref 8.9–10.3)
Chloride: 97 mmol/L — ABNORMAL LOW (ref 98–111)
Creatinine, Ser: 0.8 mg/dL (ref 0.61–1.24)
GFR, Estimated: >60 mL/min (ref >60)
Glucose, Bld: 106 mg/dL — ABNORMAL HIGH (ref 70–99)
Potassium: 4.1 mmol/L (ref 3.5–5.1)
Sodium: 132 mmol/L — ABNORMAL LOW (ref 135–145)
Total Bilirubin: 0.8 mg/dL (ref 0.3–1.2)
Total Protein: 7.8 g/dL (ref 6.5–8.1)

## 2020-11-14 MED ORDER — METHYLPREDNISOLONE SODIUM SUCC 125 MG IJ SOLR
60.0000 mg | Freq: Every day | INTRAMUSCULAR | Status: DC
  Administered 2020-11-14 – 2020-11-15 (×2): 60 mg via INTRAVENOUS
  Filled 2020-11-14 (×2): qty 2

## 2020-11-14 MED ORDER — PENICILLIN G POTASSIUM 20000000 UNITS IJ SOLR
12.0000 10*6.[IU] | Freq: Two times a day (BID) | INTRAVENOUS | Status: DC
  Administered 2020-11-14 – 2020-11-19 (×10): 12 10*6.[IU] via INTRAVENOUS
  Filled 2020-11-14 (×13): qty 12

## 2020-11-14 NOTE — Progress Notes (Signed)
PROGRESS NOTE    Juan Tanner  DPO:[id number] DOB: July 03, 1965 DOA: 11/08/2020 PCP: Pcp, No    Chief Complaint  Patient presents with   Leg Pain   Hand Pain   Flank Pain    Brief Narrative:    55 year old male with medical history of GERD, gout presents with right knee swelling and swelling of left wrist. MRI of the left wrist done yesterday showed large complex wrist joint effusion along with synovitis and diffuse inflammation along the joint.  No definite findings to suggest osteomyelitis.  He underwent right knee aspiration of the right knee growing streptococcus agalactiae sensitive to rocephin.   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  Synovial fluid cultures from the right knee growing streptococcus agalactiae sensitive to rocephin.  Uric acid wnl. RF is 30.4.  Elevated CRP and ESR. CCP Antibodies IgG/IgA elevated at 102. Will start treatment for RA with IV solumedrol until his symptoms improve.  Will request Dr Erroll Luna to evaluate his left wrist.  ID consulted, recommended IV penicillin for 4 weeks.  Will place PICC line in am.    Transaminitis:  Mild, unclear etiology.  RUQ unremarkable.  Hepatitis panel is negative.    Vitamin b12 and folate deficiency:  - replacements ordered.    Leukocytosis:  Improving with IV antibiotics.  Monitor.    Anemia of chronic disease;  Monitor H&H   Hyponatremia:  Improving.   GERD Stable.      DVT prophylaxis: heparin.  Code Status: Full code.  Family Communication: none at bedside.  Disposition:   Status is: Inpatient  Remains inpatient appropriate because:Ongoing diagnostic testing needed not appropriate for outpatient work up, Unsafe d/c plan,  and IV treatments appropriate due to intensity of illness or inability to take PO  Dispo:  Patient From: Home  Planned Disposition: Home  Medically stable for discharge: No         Consultants:  Orthopedics.   Procedures: Aspiration of the right knee.   Antimicrobials:  Antibiotics Given (last 72 hours)     Date/Time Action Medication Dose Rate   11/12/20 1430 New Bag/Given   cefTRIAXone (ROCEPHIN) 1 g in sodium chloride 0.9 % 100 mL IVPB 1 g 200 mL/hr   11/13/20 1758 New Bag/Given   cefTRIAXone (ROCEPHIN) 1 g in sodium chloride 0.9 % 100 mL IVPB 1 g 200 mL/hr   11/14/20 1525 New Bag/Given   penicillin G potassium 12 Million Units in dextrose 5 % 500 mL continuous infusion 12 Million Units 41.7 mL/hr        Subjective: Discussed the results of the tests ,  Pt reports his wrist pain is persistent.    Objective: Vitals:   11/13/20 2009 11/13/20 2330 11/14/20 0424 11/14/20 1147  BP: 128/85 (!) 132/93 120/81 114/77  Pulse: (!) 116 (!) 113 (!) 109 (!) 113  Resp: 18 18 19 16   Temp: 100.1 F (37.8 C) 99.9 F (37.7 C) 99.1 F (37.3 C) 98 F (36.7 C)  TempSrc: Oral Oral Oral Oral  SpO2: 100% 100% 97% 95%  Weight:      Height:        Intake/Output Summary (Last 24 hours) at 11/14/2020 1710 Last data filed at 11/14/2020 1525 Gross  per 24 hour  Intake 1460 ml  Output --  Net 1460 ml    Filed Weights   11/08/20 0948 11/08/20 1949  Weight: 83 kg 82.8 kg    Examination:  General exam: alert and in mild distress frm pain.  Respiratory system: clear to auscultation, no wheezing heard.  Cardiovascular system: S1 & S2 heard, RRR, no JVD, no pedal edema  Gastrointestinal system: Abdomen is soft, NT ND BS+ Central nervous system: Alert and oriented, non focal.  Extremities: right knee swelling and left wrist swelling , with tenderness. Painful ROM of the left wrist.  Skin: scab s on the left arm,  Psychiatry: Mood is appropriate.     Data Reviewed: I have  personally reviewed following labs and imaging studies  CBC: Recent Labs  Lab 11/08/20 1005 11/09/20 0041 11/10/20 0036 11/11/20 0409 11/12/20 0212 11/13/20 0310 11/14/20 0248  WBC 22.0*   < > 27.5* 17.2* 18.4* 16.5* 14.7*  NEUTROABS 15.7*  --   --   --   --   --   --   HGB 13.8   < > 11.5* 12.2* 12.6* 11.8* 10.9*  HCT 41.4   < > 35.6* 38.0* 39.4 37.2* 34.4*  MCV 84.1   < > 84.8 84.6 85.5 85.1 85.4  PLT 527*   < > 500* 530* 543* 536* 535*   < > = values in this interval not displayed.     Basic Metabolic Panel: Recent Labs  Lab 11/10/20 0036 11/11/20 0409 11/12/20 0212 11/13/20 0310 11/14/20 0248  NA 134* 132* 129* 133* 132*  K 3.7 3.9 3.9 4.0 4.1  CL 100 100 96* 100 97*  CO2 24 22 23 25 26   GLUCOSE 166* 110* 121* 119* 106*  BUN 15 14 13 13 10   CREATININE 0.71 0.76 0.81 0.89 0.80  CALCIUM 8.5* 8.4* 8.7* 8.8* 8.6*     GFR: Estimated Creatinine Clearance: 104.5 mL/min (by C-G formula based on SCr of 0.8 mg/dL).  Liver Function Tests: Recent Labs  Lab 11/10/20 0036 11/11/20 0409 11/12/20 0212 11/13/20 0310 11/14/20 0248  AST 48* 42* 68* 48* 81*  ALT 79* 89* 102* 95* 131*  ALKPHOS 84 75 85 91 96  BILITOT 0.7 0.8 0.7 1.0 0.8  PROT 7.5 7.8 7.8 7.8 7.8  ALBUMIN 2.2* 2.3* 2.4* 2.3* 2.3*     CBG: No results for input(s): GLUCAP in the last 168 hours.   Recent Results (from the past 240 hour(s))  Body fluid culture w Gram Stain     Status: None   Collection Time: 11/08/20  2:04 PM   Specimen: Body Fluid  Result Value Ref Range Status   Specimen Description FLUID  Final   Special Requests SYNOVIAL RIGHT KNEE  Final   Gram Stain   Final    MODERATE WBC PRESENT, PREDOMINANTLY PMN NO ORGANISMS SEEN    Culture   Final    FEW STREPTOCOCCUS AGALACTIAE RESULT CALLED TO, READ BACK BY AND VERIFIED WITH: RN S.MEHAT ON [id number] AT 1245 BY E.PARRISH Performed at [facility], [place]. [address] [place], [place] [address]    Report Status 11/11/2020 FINAL   Final   Organism ID, Bacteria STREPTOCOCCUS AGALACTIAE  Final      Susceptibility   Streptococcus agalactiae - MIC*    CLINDAMYCIN RESISTANT Resistant     AMPICILLIN <=0.25 SENSITIVE Sensitive     ERYTHROMYCIN 2 RESISTANT Resistant     VANCOMYCIN 0.5 SENSITIVE Sensitive     CEFTRIAXONE <=0.12 SENSITIVE  Sensitive     LEVOFLOXACIN >=16 RESISTANT Resistant     PENICILLIN Value in next row Sensitive      SENSITIVE<=0.06    * FEW STREPTOCOCCUS AGALACTIAE  Resp Panel by RT-PCR (Flu A&B, Covid) Nasopharyngeal Swab     Status: None   Collection Time: 11/08/20  4:00 PM   Specimen: Nasopharyngeal Swab; Nasopharyngeal(NP) swabs in vial transport medium  Result Value Ref Range Status   SARS Coronavirus 2 by RT PCR NEGATIVE NEGATIVE Final    Comment: (NOTE) SARS-CoV-2 target nucleic acids are NOT DETECTED.  The SARS-CoV-2 RNA is generally detectable in upper respiratory specimens during the acute phase of infection. The lowest concentration of SARS-CoV-2 viral copies this assay can detect is 138 copies/mL. A negative result does not preclude SARS-Cov-2 infection and should not be used as the sole basis for treatment or other patient management decisions. A negative result may occur with  improper specimen collection/handling, submission of specimen other than nasopharyngeal swab, presence of viral mutation(s) within the areas targeted by this assay, and inadequate number of viral copies(<138 copies/mL). A negative result must be combined with clinical observations, patient history, and epidemiological information. The expected result is Negative.  Fact Sheet for Patients:  EntrepreneurPulse.com.au  Fact Sheet for Healthcare Providers:  IncredibleEmployment.be  This test is no t yet approved or cleared by the [place] FDA and  has been authorized for detection and/or diagnosis of SARS-CoV-2 by FDA under an Emergency Use Authorization (EUA). This EUA  will remain  in effect (meaning this test can be used) for the duration of the COVID-19 declaration under Section 564(b)(1) of the Act, 21 U.S.C.section 360bbb-3(b)(1), unless the authorization is terminated  or revoked sooner.       Influenza A by PCR NEGATIVE NEGATIVE Final   Influenza B by PCR NEGATIVE NEGATIVE Final    Comment: (NOTE) The Xpert Xpress SARS-CoV-2/FLU/RSV plus assay is intended as an aid in the diagnosis of influenza from Nasopharyngeal swab specimens and should not be used as a sole basis for treatment. Nasal washings and aspirates are unacceptable for Xpert Xpress SARS-CoV-2/FLU/RSV testing.  Fact Sheet for Patients: EntrepreneurPulse.com.au  Fact Sheet for Healthcare Providers: IncredibleEmployment.be  This test is not yet approved or cleared by the [place] FDA and has been authorized for detection and/or diagnosis of SARS-CoV-2 by FDA under an Emergency Use Authorization (EUA). This EUA will remain in effect (meaning this test can be used) for the duration of the COVID-19 declaration under Section 564(b)(1) of the Act, 21 U.S.C. section 360bbb-3(b)(1), unless the authorization is terminated or revoked.  Performed at [facility], [place] [address], [place], [place] [address]   Culture, blood (routine x 2)     Status: None   Collection Time: 11/08/20  8:54 PM   Specimen: BLOOD  Result Value Ref Range Status   Specimen Description BLOOD LEFT ANTECUBITAL  Final   Special Requests   Final    BOTTLES DRAWN AEROBIC AND ANAEROBIC Blood Culture adequate volume   Culture   Final    NO GROWTH 5 DAYS Performed at [facility], [place] [address], [place], [place] [address]    Report Status 11/13/2020 FINAL  Final  Culture, blood (routine x 2)     Status: None   Collection Time: 11/08/20  8:59 PM   Specimen: BLOOD LEFT HAND  Result Value Ref Range Status   Specimen Description BLOOD LEFT HAND  Final   Special  Requests   Final  BOTTLES DRAWN AEROBIC AND ANAEROBIC Blood Culture results may not be optimal due to an inadequate volume of blood received in culture bottles   Culture   Final    NO GROWTH 5 DAYS Performed at [facility], [place] [address], [place], [place] [address]    Report Status 11/13/2020 FINAL  Final          Radiology Studies: [place] EKG SITE RITE  Result Date: 11/14/2020 If Site Rite image not attached, placement could not be confirmed due to current cardiac rhythm.       Scheduled Meds:  colchicine  0.6 mg Oral BID   cyanocobalamin  1,000 mcg Intramuscular Daily   diclofenac Sodium  2 g Topical QID   folic acid  1 mg Oral Daily   heparin injection (subcutaneous)  5,000 Units Subcutaneous Q8H   methylPREDNISolone (SOLU-MEDROL) injection  60 mg Intravenous Daily    morphine injection  4 mg Intravenous Once   sodium chloride flush  3 mL Intravenous Q12H   Continuous Infusions:  sodium chloride     penicillin g continuous IV infusion 12 Million Units (11/14/20 1525)     LOS: 5 days        Hosie Poisson, MD [facility]   To contact the attending provider between 7A-7P or the covering provider during after hours 7P-7A, please log into the web site [email or web address] and access using universal [place] password for that web site. If you do not have the password, please call the hospital operator.  11/14/2020, 5:10 PM

## 2020-11-14 NOTE — Consult Note (Addendum)
I have seen and examined the patient. I have personally reviewed the clinical findings, laboratory findings, microbiological data and imaging studies. The assessment and treatment plan was discussed with the  Advance Practice Provider, Janene Madeira. I agree with her/his recommendations except following additions/corrections.  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 He is married ( has a wife here in [place] and another wife in [place], married 1 year ago), has 5 children. Denies smoking, alcohol and IVDU.   Septic on presentation with low grade fevers and leukocytosis.  Xray rt knee with effusion S/p  Rt knee arthrocentesis 8/5: WBC 15, 350, N 93. Cultures growing Strep agalactiae ( Pen S), no crystals  S/p left wrist aspiration on 8/8 WBC 51, no crystals  MRI left wrist with findings s/o inflammatory arthropathy vs septic arthritis No plans for OR per Ortho   Of note, RF and anti CCP antibodies elevated concerning for possible RA. Defer management to Rheumatology   Clinical presentation is not suggestive of gonococcal arthritis. Urine GC negative  Will de-escalate abtx to IV Penicillin continuous infusion Plan for 4 weeks from 8/5. End date 12/06/20 Monitor CBC and BMP Ok to place a PICC  A follow up with [facility] will be made Also needs follow up with Rheumatology ID will sign off.   Rosiland Oz, MD [facility]    Date of Admission:  11/08/2020      Total days of antibiotics 7  Ceftriaxone 8/05 >> current         Reason for Consult: Septic arthritis      Referring Provider: Karleen Hampshire Primary Care Provider: Pcp, No    Assessment: Juan Tanner is a 55 y.o. male admitted with polyarthritis (Rt knee, Lt wrist, Lt knee) found to have superimposed infection involving Rt knee due to Group B streptococcus. RF 30.4, CRP / ESR elevated, 33.9, 45, respectively and CCP Ab elevated to 102. His blood cultures were negative at admission. Urine gonorrhea and chlamydia is negative. Has been followed by ortho with initially plans to take to OR for I&D however given significant improvement after aspiration and antibiotics this was deferred.   Overall his condition appears to be improved based on his account and leukocytosis trending down. Still with some lateral pain over what seems to be ligament.  Referral has been made to rheumatology for outpatient management given concern for underlying inflammatory arthritis and steroids have been started.  Group B strep is sensitive to PCN. Would narrow to continuous infusion for ongoing treatment at home. He probably should get another 3 weeks of IV antibiotics for native septic arthritis treatment.   Lt wrist effusion - MRI with large complex joint effusion and synovitis with diffuse inflammation around the joint. No definite osteomyelitis identified. Fluid from aspiration 8/08 was unimpressive with WBC only 51. Seems more of an inflammatory effusion. Follow up ortho impression / plan.   Transaminitis - present on admission and new since May 2022 and currently up-trending. Acute hepatitis panel negative. Does not drink alcohol. Ultrasound was unremarkable. ?related to  autoimmune process or medication side effect? Will follow after change to PCN.     Plan: Change IV antibiotics to continuous penicillin infusion  OPAT planned through 9/02 ESR / CRP in AM  Needs PICC line placed and home education  FU in ID clinic  FU with Rheumatology   OPAT ORDERS:  Diagnosis: Native Joint Septic Arthritis  Culture Result: Group B  strep   No Known Allergies   Discharge antibiotics to be given via PICC line:  Per pharmacy protocol PENICILLIN G    Duration: 3 weeks   End Date: 9/02  [facility] Care Per Protocol with [employer] Use: Home health RN for IV administration and teaching, line care and labs.    Labs weekly while on IV antibiotics: _x_ CBC with differential __ BMP _x_ CMP _x_ CRP _x_ ESR __ Vancomycin trough __ CK  _x_ Please pull PIC at completion of IV antibiotics __ Please leave PIC in place until doctor has seen patient or been notified  Fax weekly labs to [phone]  Clinic Follow Up Appt: 8/25 @ 3:30 pm with Dr. West Bali       Principal Problem:   Cellulitis of right lower leg Active Problems:   Effusion of right knee   Joint pain   Transaminitis   Leukocytosis   Cellulitis of right lower extremity    colchicine  0.6 mg Oral BID   cyanocobalamin  1,000 mcg Intramuscular Daily   diclofenac Sodium  2 g Topical QID   folic acid  1 mg Oral Daily   heparin injection (subcutaneous)  5,000 Units Subcutaneous Q8H    morphine injection  4 mg Intravenous Once   sodium chloride flush  3 mL Intravenous Q12H    HPI: Juan Tanner is a 55 y.o. male admitted from home on 11/08/2020 with severe Rt knee pain.   Has been seen in ER setting for this frequently for joint pain since 11/01/20 and treated for gout flare - has been on indomethacin, colchicine, oxycocone without any relief. Symptoms started a week prior to admission with swelling of the right knee that became so painful he could hardly walk on it. Also queries if he had reaction to catfish (had some delayed 24-36h onset itching, bumps to neck prior to left wrist and knee pain). Exam in the ER suggested cellulitis to the lower leg distal to knee with "impressive edema, erythema and warmth".  GC/C negative from urine. Arthrocentesis on 8/05 from Rt knee with 15,350 WBC, 93% neutrophils and cultures growing rare group b strep.  Lt  wrist MRI with large effusion, no osteomyelitis noted - arthrocentesis with WBC 51, 98% neutrophils, no cultures done; no crystals seen.   He states the Lt pointer finger hurt before the wrist became swollen. Reports "feelings of heat" since onset of illness that have improved overall. Last episode 2 nights ago. The only pain he has in Rt knee presently is on the lateral aspect of the patella. Feels overall the knee is stiff but with ice his ability to flex it is better. He is able to walk more now with use of walker. Tolerating antibiotics well without any side effects. The swelling in the RLE has resolved. No open cuts currently but reports the night before his joints started hurting was after he had a few days of itching and "large bumpy rash" on his neck he was scratching at a lot after eating white fish prepared from his wife.   Originally from [place]; has been in  [place] since 1998 and [place] since 2000. Works as a Administrator and drives all over. Has 2 male partners/wives.  Has not been able to work or operate vehicle with Rt knee pain. Does not drink alcohol, use illicit drugs or smoke cigarettes. No rashes or GU/GI symptoms.     Review of Systems: Review of Systems  Constitutional:  Positive for chills. Negative for diaphoresis, fever and malaise/fatigue.  HENT:  Negative for sinus pain and sore throat.   Respiratory:  Negative for cough and shortness of breath.   Cardiovascular:  Negative for chest pain. Leg swelling: improved currently. Gastrointestinal:  Negative for abdominal pain, nausea and vomiting.  Genitourinary:  Negative for dysuria.  Musculoskeletal:  Positive for joint pain. Negative for back pain, falls, myalgias and neck pain.  Skin:  Negative for rash.  Neurological:  Negative for dizziness, focal weakness, weakness and headaches.  Psychiatric/Behavioral:  Negative for depression and substance abuse.    Past Medical History:  Diagnosis Date   Blood in stool    GERD  (gastroesophageal reflux disease)    Premature ventricular contraction    outflow tract pvcs    Social History   Tobacco Use   Smoking status: Never   Smokeless tobacco: Never  Vaping Use   Vaping Use: Never used  Substance Use Topics   Alcohol use: No   Drug use: No    Family History  Problem Relation Age of Onset   Heart attack Neg Hx    No Known Allergies  OBJECTIVE: Blood pressure 120/81, pulse (!) 109, temperature 99.1 F (37.3 C), temperature source Oral, resp. rate 19, height 5' 5"  (1.651 m), weight 82.8 kg, SpO2 97 %.  Physical Exam Vitals and nursing note reviewed.  Constitutional:      Comments: Resting in bed with one male visitor present   HENT:     Mouth/Throat:     Mouth: Mucous membranes are moist. No oral lesions.     Dentition: Normal dentition. No dental caries.     Pharynx: Oropharynx is clear.  Eyes:     General: No scleral icterus. Cardiovascular:     Rate and Rhythm: Normal rate and regular rhythm.     Heart sounds: Normal heart sounds.  Pulmonary:     Effort: Pulmonary effort is normal.     Breath sounds: Normal breath sounds.  Abdominal:     General: There is no distension.     Palpations: Abdomen is soft.     Tenderness: There is no abdominal tenderness.  Musculoskeletal:     Comments: Rt knee wild mild swelling vs Lt. No warmth or erythema of fluctuance. Lateral ligament pain. Can flex knee beyond 90 degrees actively.   Lt wrist with moderate swelling. No warmth/tenderness. Weak and difficulty performing flexion of fingers.   Lymphadenopathy:     Cervical: No cervical adenopathy.  Skin:    General: Skin is warm and dry.     Findings: No rash.  Neurological:     Mental Status: He is alert and oriented to person, place, and time.    Lab Results Lab Results  Component Value Date   WBC 14.7 (H) 11/14/2020   HGB 10.9 (L) 11/14/2020   HCT 34.4 (L) 11/14/2020   MCV 85.4 11/14/2020   PLT 535 (H) 11/14/2020    Lab Results   Component Value Date   CREATININE 0.80 11/14/2020   BUN 10 11/14/2020   NA 132 (L) 11/14/2020   K 4.1 11/14/2020   CL  97 (L) 11/14/2020   CO2 26 11/14/2020    Lab Results  Component Value Date   ALT 131 (H) 11/14/2020   AST 81 (H) 11/14/2020   ALKPHOS 96 11/14/2020   BILITOT 0.8 11/14/2020     Microbiology: Recent Results (from the past 240 hour(s))  Body fluid culture w Gram Stain     Status: None   Collection Time: 11/08/20  2:04 PM   Specimen: Body Fluid  Result Value Ref Range Status   Specimen Description FLUID  Final   Special Requests SYNOVIAL RIGHT KNEE  Final   Gram Stain   Final    MODERATE WBC PRESENT, PREDOMINANTLY PMN NO ORGANISMS SEEN    Culture   Final    FEW STREPTOCOCCUS AGALACTIAE RESULT CALLED TO, READ BACK BY AND VERIFIED WITH: RN S.MEHAT ON [id number] AT 1245 BY E.PARRISH Performed at [facility], [place]. [address] [place], [place] [address]    Report Status 11/11/2020 FINAL  Final   Organism ID, Bacteria STREPTOCOCCUS AGALACTIAE  Final      Susceptibility   Streptococcus agalactiae - MIC*    CLINDAMYCIN RESISTANT Resistant     AMPICILLIN <=0.25 SENSITIVE Sensitive     ERYTHROMYCIN 2 RESISTANT Resistant     VANCOMYCIN 0.5 SENSITIVE Sensitive     CEFTRIAXONE <=0.12 SENSITIVE Sensitive     LEVOFLOXACIN >=16 RESISTANT Resistant     PENICILLIN Value in next row Sensitive      SENSITIVE<=0.06    * FEW STREPTOCOCCUS AGALACTIAE  Resp Panel by RT-PCR (Flu A&B, Covid) Nasopharyngeal Swab     Status: None   Collection Time: 11/08/20  4:00 PM   Specimen: Nasopharyngeal Swab; Nasopharyngeal(NP) swabs in vial transport medium  Result Value Ref Range Status   SARS Coronavirus 2 by RT PCR NEGATIVE NEGATIVE Final    Comment: (NOTE) SARS-CoV-2 target nucleic acids are NOT DETECTED.  The SARS-CoV-2 RNA is generally detectable in upper respiratory specimens during the acute phase of infection. The lowest concentration of SARS-CoV-2 viral copies this  assay can detect is 138 copies/mL. A negative result does not preclude SARS-Cov-2 infection and should not be used as the sole basis for treatment or other patient management decisions. A negative result may occur with  improper specimen collection/handling, submission of specimen other than nasopharyngeal swab, presence of viral mutation(s) within the areas targeted by this assay, and inadequate number of viral copies(<138 copies/mL). A negative result must be combined with clinical observations, patient history, and epidemiological information. The expected result is Negative.  Fact Sheet for Patients:  EntrepreneurPulse.com.au  Fact Sheet for Healthcare Providers:  IncredibleEmployment.be  This test is no t yet approved or cleared by the [place] FDA and  has been authorized for detection and/or diagnosis of SARS-CoV-2 by FDA under an Emergency Use Authorization (EUA). This EUA will remain  in effect (meaning this test can be used) for the duration of the COVID-19 declaration under Section 564(b)(1) of the Act, 21 U.S.C.section 360bbb-3(b)(1), unless the authorization is terminated  or revoked sooner.       Influenza A by PCR NEGATIVE NEGATIVE Final   Influenza B by PCR NEGATIVE NEGATIVE Final    Comment: (NOTE) The Xpert Xpress SARS-CoV-2/FLU/RSV plus assay is intended as an aid in the diagnosis of influenza from Nasopharyngeal swab specimens and should not be used as a sole basis for treatment. Nasal washings and aspirates are unacceptable for Xpert Xpress SARS-CoV-2/FLU/RSV testing.  Fact Sheet for Patients: EntrepreneurPulse.com.au  Fact Sheet for Healthcare  Providers: IncredibleEmployment.be  This test is not yet approved or cleared by the [place] and has been authorized for detection and/or diagnosis of SARS-CoV-2 by FDA under an Emergency Use Authorization (EUA). This EUA will  remain in effect (meaning this test can be used) for the duration of the COVID-19 declaration under Section 564(b)(1) of the Act, 21 U.S.C. section 360bbb-3(b)(1), unless the authorization is terminated or revoked.  Performed at [facility], [place] [address] [place], [place] [address]   Culture, blood (routine x 2)     Status: None   Collection Time: 11/08/20  8:54 PM   Specimen: BLOOD  Result Value Ref Range Status   Specimen Description BLOOD LEFT ANTECUBITAL  Final   Special Requests   Final    BOTTLES DRAWN AEROBIC AND ANAEROBIC Blood Culture adequate volume   Culture   Final    NO GROWTH 5 DAYS Performed at [facility], [place] [address] [place], [place] [address]    Report Status 11/13/2020 FINAL  Final  Culture, blood (routine x 2)     Status: None   Collection Time: 11/08/20  8:59 PM   Specimen: BLOOD LEFT HAND  Result Value Ref Range Status   Specimen Description BLOOD LEFT HAND  Final   Special Requests   Final    BOTTLES DRAWN AEROBIC AND ANAEROBIC Blood Culture results may not be optimal due to an inadequate volume of blood received in culture bottles   Culture   Final    NO GROWTH 5 DAYS Performed at [facility], [place] [address] [place], [place] [address]    Report Status 11/13/2020 FINAL  Final    Janene Madeira, MSN, NP-C [place] [facility] Cell: [phone] Pager: [phone]  11/14/2020 8:57 AM

## 2020-11-14 NOTE — Plan of Care (Signed)
  Problem: Education: Goal: Knowledge of General Education information will improve Description Including pain rating scale, medication(s)/side effects and non-pharmacologic comfort measures Outcome: Progressing   

## 2020-11-14 NOTE — Progress Notes (Signed)
PHARMACY CONSULT NOTE FOR:  OUTPATIENT  PARENTERAL ANTIBIOTIC THERAPY (OPAT)  Indication: native joint septic arthritis Regimen: penicillin G 24 million units every 24 hours as a continuous infusion End date: 12/06/20  IV antibiotic discharge orders are pended. To discharging provider:  please sign these orders via discharge navigator,  Select New Orders & click on the button choice - Manage This Unsigned Work.     Thank you for allowing pharmacy to be a part of this patient's care.  Georgeann Oppenheim, PharmD Pharmacy Resident 11/14/2020, 1:05 PM

## 2020-11-15 ENCOUNTER — Inpatient Hospital Stay (HOSPITAL_COMMUNITY): Payer: BLUE CROSS/BLUE SHIELD

## 2020-11-15 LAB — SEDIMENTATION RATE: Sed Rate: 112 mm/hr — ABNORMAL HIGH (ref 0–16)

## 2020-11-15 LAB — C-REACTIVE PROTEIN: CRP: 21.7 mg/dL — ABNORMAL HIGH (ref <1.0)

## 2020-11-15 MED ORDER — CHLORHEXIDINE GLUCONATE CLOTH 2 % EX PADS
6.0000 | MEDICATED_PAD | Freq: Every day | CUTANEOUS | Status: DC
  Administered 2020-11-16 – 2020-11-19 (×4): 6 via TOPICAL

## 2020-11-15 MED ORDER — FOLIC ACID 1 MG PO TABS: 1.0000 mg | ORAL_TABLET | Freq: Every day | ORAL | 2 refills | Status: DC

## 2020-11-15 MED ORDER — PREDNISONE 20 MG PO TABS
40.0000 mg | ORAL_TABLET | Freq: Every day | ORAL | Status: DC
  Administered 2020-11-16 – 2020-11-17 (×2): 40 mg via ORAL
  Filled 2020-11-15 (×2): qty 2

## 2020-11-15 MED ORDER — SODIUM CHLORIDE 0.9% FLUSH: 10.0000 mL | INTRAVENOUS | Status: DC | PRN

## 2020-11-15 MED ORDER — PENICILLIN G POTASSIUM IV (FOR PTA / DISCHARGE USE ONLY): 24.0000 10*6.[IU] | INTRAVENOUS | 0 refills | Status: AC

## 2020-11-15 NOTE — TOC Initial Note (Addendum)
Transition of Care [facility]) - Initial/Assessment Note    Patient Details  Name: Juan Tanner MRN: [id number] Date of Birth: February 22, 1966  [facility]) CM/SW Contact:    Marilu Favre, RN Phone Number: 11/15/2020, 10:53 AM  Clinical Narrative:                  Spoke to patient, wife and friend at bedside. Discussed PICC line placement for today and possible discharge home tomorrow with IV PCN.   Patient has [place]. NCM called spoke to [place] with [facility]. Jackelyn Poling will arrange [facility] to met patient and family at his home tomorrow to start infusion and provide teaching. Patient and vistors aware.   Jackelyn Poling will need OPAT and clinical emailed to her at [email or web address] . Emailed OPAT and clinical Expected Discharge Plan: [place]     Patient Goals and CMS Choice Patient states their goals for this hospitalization and ongoing recovery are:: to go home CMS Medicare.gov Compare Post Acute Care list provided to:: Patient Choice offered to / list presented to : Patient  Expected Discharge Plan and Services Expected Discharge Plan: [place] Choice: [place] arrangements for the past 2 months: Single Family Home                 DME Arranged: N/A         HH Arranged: RN          Prior Living Arrangements/Services Living arrangements for the past 2 months: Single Family Home Lives with:: Spouse Patient language and need for interpreter reviewed:: Yes        Need for Family Participation in Patient Care: Yes (Comment) Care giver support system in place?: Yes (comment)   Criminal Activity/Legal Involvement Pertinent to Current Situation/Hospitalization: No - Comment as needed  Activities of Daily Living Home Assistive Devices/Equipment: None ADL Screening (condition at time of admission) Patient's cognitive ability adequate to safely complete daily activities?:  Yes Is the patient deaf or have difficulty hearing?: No Does the patient have difficulty seeing, even when wearing glasses/contacts?: No Does the patient have difficulty concentrating, remembering, or making decisions?: No Patient able to express need for assistance with ADLs?: Yes Does the patient have difficulty dressing or bathing?: No Independently performs ADLs?: Yes (appropriate for developmental age) Does the patient have difficulty walking or climbing stairs?: Yes Weakness of Legs: Right Weakness of Arms/Hands: Left  Permission Sought/Granted   Permission granted to share information with : Yes, Verbal Permission Granted  Share Information with NAME: wife Ramatou  Permission granted to share info w AGENCY: [facility]        Emotional Assessment Appearance:: Appears stated age Attitude/Demeanor/Rapport: Engaged Affect (typically observed): Accepting Orientation: : Oriented to Self, Oriented to Place, Oriented to  Time, Oriented to Situation Alcohol / Substance Use: Not Applicable Psych Involvement: No (comment)  Admission diagnosis:  Septic arthritis ([place]) [M00.9] Cellulitis of right lower extremity [L03.115] Patient Active Problem List   Diagnosis Date Noted   Cellulitis of right lower extremity 11/09/2020   Cellulitis of right lower leg 11/08/2020   Effusion of right knee 11/08/2020   Joint pain 11/08/2020   Transaminitis 11/08/2020   Leukocytosis 11/08/2020   Acute gout of left wrist 11/04/2020   Acute pain of left knee 11/04/2020   Premature ventricular contractions 06/06/2012   GERD 02/16/2008   PCP:  Pcp, No Pharmacy:  [facility] [place], [place] AT [facility] [address] [place] [address] Phone: [phone] Fax: [place] [place] [address]  [address] Phone: [phone] Fax: [phone]     Social Determinants of Health (SDOH) Interventions    Readmission Risk Interventions No flowsheet data found.

## 2020-11-15 NOTE — Progress Notes (Signed)
PROGRESS NOTE    Juan Tanner  JQB:[id number] DOB: 05/24/1965 DOA: 11/08/2020 PCP: Pcp, No    Chief Complaint  Patient presents with   Leg Pain   Hand Pain   Flank Pain    Brief Narrative:    55 year old male with medical history of GERD, gout presents with right knee swelling and swelling of left wrist. MRI of the left wrist done yesterday showed large complex wrist joint effusion along with synovitis and diffuse inflammation along the joint.  No definite findings to suggest osteomyelitis.  He underwent right knee aspiration of the right knee growing streptococcus agalactiae sensitive to rocephin.   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  Synovial fluid cultures from the right knee growing streptococcus agalactiae sensitive to rocephin.  Uric acid wnl. RF is 30.4.  Elevated CRP and ESR. CCP Antibodies IgG/IgA elevated at 102. Started the patient on solumedrol, with improvement in the swelling of the wrist .  Will transition to oral prednisone in am and possible d.c in am.  Will request Dr Erroll Luna to evaluate his left wrist.  ID consulted, recommended IV penicillin for 4 weeks.  Will place PICC line in am.  Will need outpatient follo wup with rheumatology to start DMARD'S    Transaminitis:  Mild, unclear etiology.  RUQ unremarkable.  Hepatitis panel is negative.    Vitamin b12 and folate deficiency:  - replacements ordered.    Leukocytosis:  Improving with IV antibiotics.  Monitor.    Anemia of chronic disease;  Monitor H&H   Hyponatremia:  Improving. Sodium at 132. Asymptomatic.   GERD Stable.   DVT prophylaxis: heparin.  Code Status: Full code.  Family Communication: none at bedside.   Disposition:   Status is: Inpatient  Remains inpatient appropriate because:Ongoing diagnostic testing needed not appropriate for outpatient work up, Unsafe d/c plan, and IV treatments appropriate due to intensity of illness or inability to take PO  Dispo:  Patient From: Home  Planned Disposition: Home  Medically stable for discharge: No         Consultants:  Orthopedics.   Procedures: Aspiration of the right knee.   Antimicrobials:  Antibiotics Given (last 72 hours)     Date/Time Action Medication Dose Rate   11/13/20 1758 New Bag/Given   cefTRIAXone (ROCEPHIN) 1 g in sodium chloride 0.9 % 100 mL IVPB 1 g 200 mL/hr   11/14/20 1525 New Bag/Given   penicillin G potassium 12 Million Units in dextrose 5 % 500 mL continuous infusion 12 Million Units 41.7 mL/hr   11/15/20 0357 New Bag/Given   penicillin G potassium 12 Million Units in dextrose 5 % 500 mL continuous infusion 12 Million Units 41.7 mL/hr   11/15/20 1432 New Bag/Given   penicillin G potassium 12 Million Units in dextrose 5 % 500 mL continuous infusion 12 Million Units 41.7 mL/hr        Subjective: IS IN [employer] SPIRITS.  Reports the wrist swelling and pain is better.    Objective: Vitals:   11/14/20 1755 11/14/20 2308 11/15/20 0520 11/15/20 1128  BP: 135/75 123/74 125/72 114/68  Pulse: (!) 110 (!) 101 93 98  Resp: _0 Temp: 98.8 F (37.1 C) 99.6 F (  37.6 C) 98.1 F (36.7 C) 98.1 F (36.7 C)  TempSrc: Oral Oral Oral Oral  SpO2: 96% 97% 99% 93%  Weight:      Height:        Intake/Output Summary (Last 24 hours) at 11/15/2020 1652 Last data filed at 11/15/2020 0521 Gross per 24 hour  Intake 480 ml  Output --  Net 480 ml    Filed Weights   11/08/20 0948 11/08/20 1949  Weight: 83 kg 82.8 kg    Examination:  General exam: alert and comfortable, no t in any distress.  Respiratory system: clear to auscultation, no wheezing or rhonchi.  Cardiovascular system: RRR, NO JVD, no pedal edema.   Gastrointestinal system: Abdomen is soft, non tender non distended bowel sounds wnl.  Central nervous system: Alert and oriented, gorssly non focal.  Extremities: right knee swelling has improved. Left wrist pain and swelling has improved.  Skin: scab s on the left arm,  Psychiatry: Mood appropriate.     Data Reviewed: I have personally reviewed following labs and imaging studies  CBC: Recent Labs  Lab 11/10/20 0036 11/11/20 0409 11/12/20 0212 11/13/20 0310 11/14/20 0248  WBC 27.5* 17.2* 18.4* 16.5* 14.7*  HGB 11.5* 12.2* 12.6* 11.8* 10.9*  HCT 35.6* 38.0* 39.4 37.2* 34.4*  MCV 84.8 84.6 85.5 85.1 85.4  PLT 500* 530* 543* 536* 535*     Basic Metabolic Panel: Recent Labs  Lab 11/10/20 0036 11/11/20 0409 11/12/20 0212 11/13/20 0310 11/14/20 0248  NA 134* 132* 129* 133* 132*  K 3.7 3.9 3.9 4.0 4.1  CL 100 100 96* 100 97*  CO2 _0 GLUCOSE 166* 110* 121* 119* 106*  BUN _1 CREATININE 0.71 0.76 0.81 0.89 0.80  CALCIUM 8.5* 8.4* 8.7* 8.8* 8.6*     GFR: Estimated Creatinine Clearance: 104.5 mL/min (by C-G formula based on SCr of 0.8 mg/dL).  Liver Function Tests: Recent Labs  Lab 11/10/20 0036 11/11/20 0409 11/12/20 0212 11/13/20 0310 11/14/20 0248  AST 48* 42* 68* 48* 81*  ALT 79* 89* 102* 95* 131*  ALKPHOS 84 75 85 91 96  BILITOT 0.7 0.8 0.7 1.0 0.8  PROT 7.5 7.8 7.8 7.8 7.8  ALBUMIN 2.2* 2.3* 2.4* 2.3* 2.3*     CBG: No results for input(s): GLUCAP in the last 168 hours.   Recent Results (from the past 240 hour(s))  Body fluid culture w Gram Stain     Status: None   Collection Time: 11/08/20  2:04 PM   Specimen: Body Fluid  Result Value Ref Range Status   Specimen Description FLUID  Final   Special Requests SYNOVIAL RIGHT KNEE  Final   Gram Stain   Final    MODERATE WBC PRESENT, PREDOMINANTLY PMN NO ORGANISMS SEEN    Culture   Final    FEW STREPTOCOCCUS AGALACTIAE RESULT CALLED TO, READ BACK BY AND VERIFIED WITH: RN  S.MEHAT ON [id number] AT 1245 BY E.PARRISH Performed at [facility], [place]. [address], [place], [place] [address]    Report Status 11/11/2020 FINAL  Final   Organism ID, Bacteria STREPTOCOCCUS AGALACTIAE  Final      Susceptibility   Streptococcus agalactiae - MIC*    CLINDAMYCIN RESISTANT Resistant     AMPICILLIN <=0.25 SENSITIVE Sensitive     ERYTHROMYCIN 2 RESISTANT Resistant     VANCOMYCIN 0.5 SENSITIVE Sensitive     CEFTRIAXONE <=0.12 SENSITIVE Sensitive     LEVOFLOXACIN >=16 RESISTANT Resistant  PENICILLIN Value in next row Sensitive      SENSITIVE<=0.06    * FEW STREPTOCOCCUS AGALACTIAE  Resp Panel by RT-PCR (Flu A&B, Covid) Nasopharyngeal Swab     Status: None   Collection Time: 11/08/20  4:00 PM   Specimen: Nasopharyngeal Swab; Nasopharyngeal(NP) swabs in vial transport medium  Result Value Ref Range Status   SARS Coronavirus 2 by RT PCR NEGATIVE NEGATIVE Final    Comment: (NOTE) SARS-CoV-2 target nucleic acids are NOT DETECTED.  The SARS-CoV-2 RNA is generally detectable in upper respiratory specimens during the acute phase of infection. The lowest concentration of SARS-CoV-2 viral copies this assay can detect is 138 copies/mL. A negative result does not preclude SARS-Cov-2 infection and should not be used as the sole basis for treatment or other patient management decisions. A negative result may occur with  improper specimen collection/handling, submission of specimen other than nasopharyngeal swab, presence of viral mutation(s) within the areas targeted by this assay, and inadequate number of viral copies(<138 copies/mL). A negative result must be combined with clinical observations, patient history, and epidemiological information. The expected result is Negative.  Fact Sheet for Patients:  EntrepreneurPulse.com.au  Fact Sheet for Healthcare Providers:  IncredibleEmployment.be  This test is no t yet approved or cleared  by the [place] FDA and  has been authorized for detection and/or diagnosis of SARS-CoV-2 by FDA under an Emergency Use Authorization (EUA). This EUA will remain  in effect (meaning this test can be used) for the duration of the COVID-19 declaration under Section 564(b)(1) of the Act, 21 U.S.C.section 360bbb-3(b)(1), unless the authorization is terminated  or revoked sooner.       Influenza A by PCR NEGATIVE NEGATIVE Final   Influenza B by PCR NEGATIVE NEGATIVE Final    Comment: (NOTE) The Xpert Xpress SARS-CoV-2/FLU/RSV plus assay is intended as an aid in the diagnosis of influenza from Nasopharyngeal swab specimens and should not be used as a sole basis for treatment. Nasal washings and aspirates are unacceptable for Xpert Xpress SARS-CoV-2/FLU/RSV testing.  Fact Sheet for Patients: EntrepreneurPulse.com.au  Fact Sheet for Healthcare Providers: IncredibleEmployment.be  This test is not yet approved or cleared by the [place] FDA and has been authorized for detection and/or diagnosis of SARS-CoV-2 by FDA under an Emergency Use Authorization (EUA). This EUA will remain in effect (meaning this test can be used) for the duration of the COVID-19 declaration under Section 564(b)(1) of the Act, 21 U.S.C. section 360bbb-3(b)(1), unless the authorization is terminated or revoked.  Performed at [facility], [place] [address], [place], [place] [address]   Culture, blood (routine x 2)     Status: None   Collection Time: 11/08/20  8:54 PM   Specimen: BLOOD  Result Value Ref Range Status   Specimen Description BLOOD LEFT ANTECUBITAL  Final   Special Requests   Final    BOTTLES DRAWN AEROBIC AND ANAEROBIC Blood Culture adequate volume   Culture   Final    NO GROWTH 5 DAYS Performed at [facility], [place] [address], [place], [place] [address]    Report Status 11/13/2020 FINAL  Final  Culture, blood (routine x 2)     Status: None    Collection Time: 11/08/20  8:59 PM   Specimen: BLOOD LEFT HAND  Result Value Ref Range Status   Specimen Description BLOOD LEFT HAND  Final   Special Requests   Final    BOTTLES DRAWN AEROBIC AND ANAEROBIC Blood Culture results may not  be optimal due to an inadequate volume of blood received in culture bottles   Culture   Final    NO GROWTH 5 DAYS Performed at [facility], [place] [address] [place], [place] [address]    Report Status 11/13/2020 FINAL  Final          Radiology Studies: [place] EKG SITE RITE  Result Date: 11/14/2020 If Site Rite image not attached, placement could not be confirmed due to current cardiac rhythm.       Scheduled Meds:  cyanocobalamin  1,000 mcg Intramuscular Daily   diclofenac Sodium  2 g Topical QID   folic acid  1 mg Oral Daily   heparin injection (subcutaneous)  5,000 Units Subcutaneous Q8H    morphine injection  4 mg Intravenous Once   [START ON 11/16/2020] predniSONE  40 mg Oral QAC breakfast   sodium chloride flush  3 mL Intravenous Q12H   Continuous Infusions:  sodium chloride     penicillin g continuous IV infusion 12 Million Units (11/15/20 1432)     LOS: 6 days        Hosie Poisson, MD [facility]   To contact the attending provider between 7A-7P or the covering provider during after hours 7P-7A, please log into the web site [email or web address] and access using universal [place] password for that web site. If you do not have the password, please call the hospital operator.  11/15/2020, 4:52 PM

## 2020-11-15 NOTE — Progress Notes (Signed)
Peripherally Inserted Central Catheter Placement  The IV Nurse has discussed with the patient and/or persons authorized to consent for the patient, the purpose of this procedure and the potential benefits and risks involved with this procedure.  The benefits include less needle sticks, lab draws from the catheter, and the patient may be discharged home with the catheter. Risks include, but not limited to, infection, bleeding, blood clot (thrombus formation), and puncture of an artery; nerve damage and irregular heartbeat and possibility to perform a PICC exchange if needed/ordered by physician.  Alternatives to this procedure were also discussed.  Bard Power PICC patient education guide, fact sheet on infection prevention and patient information card has been provided to patient /or left at bedside.    PICC Placement Documentation  PICC Single Lumen 11/15/20 Right Brachial 38 cm 0 cm (Active)  Indication for Insertion or Continuance of Line Home intravenous therapies (PICC only) 11/15/20 2200  Exposed Catheter (cm) 0 cm 11/15/20 2200  Site Assessment Clean;Dry;Intact 11/15/20 2200  Line Status Flushed;Blood return noted;Saline locked 11/15/20 2200  Dressing Type Transparent 11/15/20 2200  Dressing Status Clean;Dry;Intact 11/15/20 2200  Antimicrobial disc in place? Yes 11/15/20 2200  Safety Lock Not Applicable 11/15/20 2200  Line Care Connections checked and tightened 11/15/20 2200  Dressing Intervention New dressing 11/15/20 2200  Dressing Change Due 11/22/20 11/15/20 2200       Jerilyn Gillaspie Robb Matar 11/15/2020, 10:46 PM

## 2020-11-16 NOTE — Progress Notes (Signed)
PROGRESS NOTE    Juan Tanner  ZOX:[id number] DOB: 1965-10-07 DOA: 11/08/2020 PCP: Pcp, No    Chief Complaint  Patient presents with   Leg Pain   Hand Pain   Flank Pain    Brief Narrative:    55 year old male with medical history of GERD, gout presents with right knee swelling and swelling of left wrist. MRI of the left wrist done yesterday showed large complex wrist joint effusion along with synovitis and diffuse inflammation along the joint.  No definite findings to suggest osteomyelitis.  He underwent right knee aspiration of the right knee growing streptococcus agalactiae sensitive to rocephin.   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  Synovial fluid cultures from the right knee growing streptococcus agalactiae sensitive to rocephin.  Uric acid wnl. RF is 30.4.  Elevated CRP and ESR. CCP Antibodies IgG/IgA elevated at 102. Started the patient on solumedrol, with improvement in the swelling of the wrist .  Transition to oral prednisone today.  Patient reports the swelling and pain in the wrist has improved but not completely resolved Will monitor for 24 more hours and discharge him in the morning. ID consulted, recommended IV penicillin for 4 weeks.  PICC line placed Will need outpatient follow up with rheumatology to start DMARD'S  CRP elevated at 21 and ESR is 112  Transaminitis:  Mild, unclear etiology.  RUQ unremarkable.  Hepatitis panel is negative.    Vitamin b12 and folate deficiency:  - replacements ordered.    Leukocytosis:  Improving with IV antibiotics.  Monitor.  Recheck CBC in the morning   Anemia of chronic disease;  Hemoglobin stable between 11-10   Hyponatremia:  Improving.  Sodium at 132. Asymptomatic.   GERD Stable.   DVT prophylaxis: heparin.  Code Status: Full code.  Family Communication: none at bedside.  Disposition:   Status is: Inpatient  Remains inpatient appropriate because:Ongoing diagnostic testing needed not appropriate for outpatient work up, Unsafe d/c plan, and IV treatments appropriate due to intensity of illness or inability to take PO  Dispo:  Patient From: Home  Planned Disposition: Home  Medically stable for discharge: No         Consultants:  Orthopedics.   Procedures: Aspiration of the right knee.   Antimicrobials:  Antibiotics Given (last 72 hours)     Date/Time Action Medication Dose Rate   11/13/20 1758 New Bag/Given   cefTRIAXone (ROCEPHIN) 1 g in sodium chloride 0.9 % 100 mL IVPB 1 g 200 mL/hr   11/14/20 1525 New Bag/Given   penicillin G potassium 12 Million Units in dextrose 5 % 500 mL continuous infusion 12 Million Units 41.7 mL/hr   11/15/20 0357 New Bag/Given   penicillin G potassium 12 Million Units in dextrose 5 % 500 mL continuous infusion 12 Million Units 41.7 mL/hr   11/15/20 1432 New Bag/Given   penicillin G potassium 12 Million Units in dextrose 5 % 500 mL continuous infusion 12 Million Units 41.7 mL/hr   11/16/20 0213 New Bag/Given   penicillin G potassium 12 Million Units in dextrose 5 % 500 mL continuous infusion 12 Million Units 41.7 mL/hr   11/16/20 1637 New Bag/Given   penicillin G potassium 12 Million Units in dextrose 5 %  500 mL continuous infusion 12 Million Units 41.7 mL/hr        Subjective: Patient reports the wrist swelling and tenderness has improved but not completely resolved, right knee pain has resolved   Objective: Vitals:   11/15/20 1717 11/15/20 2346 11/16/20 0505 11/16/20 1229  BP: 120/85 125/86 130/87 (!) 151/84  Pulse: 95 79 85 92  Resp: 17 18 18 16   Temp: 98.4 F (36.9 C) 98.4 F (36.9 C) 97.7 F (36.5 C) 98.5 F (36.9 C)  TempSrc: Oral Oral Oral Oral  SpO2:  96% 97% 98% 100%  Weight:      Height:       No intake or output data in the 24 hours ending 11/16/20 1652  Filed Weights   11/08/20 0948 11/08/20 1949  Weight: 83 kg 82.8 kg    Examination:  General exam: Alert and comfortable not in any distress Respiratory system: Air entry fair bilateral no wheezing or rhonchi Cardiovascular system: Regular rate rhythm, no JVD, no pedal edema Gastrointestinal system: Abdomen is soft nontender bowel sounds normal  Central nervous system: Alert oriented, grossly nonfocal Extremities: Right knee swelling has resolved, left wrist pain swelling and tenderness has improved but not resolved yet Skin: No rashes or ulcers seen Psychiatry: Mood is appropriate    Data Reviewed: I have personally reviewed following labs and imaging studies  CBC: Recent Labs  Lab 11/10/20 0036 11/11/20 0409 11/12/20 0212 11/13/20 0310 11/14/20 0248  WBC 27.5* 17.2* 18.4* 16.5* 14.7*  HGB 11.5* 12.2* 12.6* 11.8* 10.9*  HCT 35.6* 38.0* 39.4 37.2* 34.4*  MCV 84.8 84.6 85.5 85.1 85.4  PLT 500* 530* 543* 536* 535*     Basic Metabolic Panel: Recent Labs  Lab 11/10/20 0036 11/11/20 0409 11/12/20 0212 11/13/20 0310 11/14/20 0248  NA 134* 132* 129* 133* 132*  K 3.7 3.9 3.9 4.0 4.1  CL 100 100 96* 100 97*  CO2 24 22 23 25 26   GLUCOSE 166* 110* 121* 119* 106*  BUN 15 14 13 13 10   CREATININE 0.71 0.76 0.81 0.89 0.80  CALCIUM 8.5* 8.4* 8.7* 8.8* 8.6*     GFR: Estimated Creatinine Clearance: 104.5 mL/min (by C-G formula based on SCr of 0.8 mg/dL).  Liver Function Tests: Recent Labs  Lab 11/10/20 0036 11/11/20 0409 11/12/20 0212 11/13/20 0310 11/14/20 0248  AST 48* 42* 68* 48* 81*  ALT 79* 89* 102* 95* 131*  ALKPHOS 84 75 85 91 96  BILITOT 0.7 0.8 0.7 1.0 0.8  PROT 7.5 7.8 7.8 7.8 7.8  ALBUMIN 2.2* 2.3* 2.4* 2.3* 2.3*     CBG: No results for input(s): GLUCAP in the last 168 hours.   Recent Results (from the past 240 hour(s))  Body fluid  culture w Gram Stain     Status: None   Collection Time: 11/08/20  2:04 PM   Specimen: Body Fluid  Result Value Ref Range Status   Specimen Description FLUID  Final   Special Requests SYNOVIAL RIGHT KNEE  Final   Gram Stain   Final    MODERATE WBC PRESENT, PREDOMINANTLY PMN NO ORGANISMS SEEN    Culture   Final    FEW STREPTOCOCCUS AGALACTIAE RESULT CALLED TO, READ BACK BY AND VERIFIED WITH: RN S.MEHAT ON [id number] AT 1245 BY E.PARRISH Performed at [facility], [place]. [address] [place], [place] [address]    Report Status 11/11/2020 FINAL  Final   Organism ID, Bacteria STREPTOCOCCUS AGALACTIAE  Final      Susceptibility  Streptococcus agalactiae - MIC*    CLINDAMYCIN RESISTANT Resistant     AMPICILLIN <=0.25 SENSITIVE Sensitive     ERYTHROMYCIN 2 RESISTANT Resistant     VANCOMYCIN 0.5 SENSITIVE Sensitive     CEFTRIAXONE <=0.12 SENSITIVE Sensitive     LEVOFLOXACIN >=16 RESISTANT Resistant     PENICILLIN Value in next row Sensitive      SENSITIVE<=0.06    * FEW STREPTOCOCCUS AGALACTIAE  Resp Panel by RT-PCR (Flu A&B, Covid) Nasopharyngeal Swab     Status: None   Collection Time: 11/08/20  4:00 PM   Specimen: Nasopharyngeal Swab; Nasopharyngeal(NP) swabs in vial transport medium  Result Value Ref Range Status   SARS Coronavirus 2 by RT PCR NEGATIVE NEGATIVE Final    Comment: (NOTE) SARS-CoV-2 target nucleic acids are NOT DETECTED.  The SARS-CoV-2 RNA is generally detectable in upper respiratory specimens during the acute phase of infection. The lowest concentration of SARS-CoV-2 viral copies this assay can detect is 138 copies/mL. A negative result does not preclude SARS-Cov-2 infection and should not be used as the sole basis for treatment or other patient management decisions. A negative result may occur with  improper specimen collection/handling, submission of specimen other than nasopharyngeal swab, presence of viral mutation(s) within the areas targeted by this  assay, and inadequate number of viral copies(<138 copies/mL). A negative result must be combined with clinical observations, patient history, and epidemiological information. The expected result is Negative.  Fact Sheet for Patients:  EntrepreneurPulse.com.au  Fact Sheet for Healthcare Providers:  IncredibleEmployment.be  This test is no t yet approved or cleared by the [place] FDA and  has been authorized for detection and/or diagnosis of SARS-CoV-2 by FDA under an Emergency Use Authorization (EUA). This EUA will remain  in effect (meaning this test can be used) for the duration of the COVID-19 declaration under Section 564(b)(1) of the Act, 21 U.S.C.section 360bbb-3(b)(1), unless the authorization is terminated  or revoked sooner.       Influenza A by PCR NEGATIVE NEGATIVE Final   Influenza B by PCR NEGATIVE NEGATIVE Final    Comment: (NOTE) The Xpert Xpress SARS-CoV-2/FLU/RSV plus assay is intended as an aid in the diagnosis of influenza from Nasopharyngeal swab specimens and should not be used as a sole basis for treatment. Nasal washings and aspirates are unacceptable for Xpert Xpress SARS-CoV-2/FLU/RSV testing.  Fact Sheet for Patients: EntrepreneurPulse.com.au  Fact Sheet for Healthcare Providers: IncredibleEmployment.be  This test is not yet approved or cleared by the [place] FDA and has been authorized for detection and/or diagnosis of SARS-CoV-2 by FDA under an Emergency Use Authorization (EUA). This EUA will remain in effect (meaning this test can be used) for the duration of the COVID-19 declaration under Section 564(b)(1) of the Act, 21 U.S.C. section 360bbb-3(b)(1), unless the authorization is terminated or revoked.  Performed at [facility], [place] [address], [place], [place] [address]   Culture, blood (routine x 2)     Status: None   Collection Time: 11/08/20  8:54 PM    Specimen: BLOOD  Result Value Ref Range Status   Specimen Description BLOOD LEFT ANTECUBITAL  Final   Special Requests   Final    BOTTLES DRAWN AEROBIC AND ANAEROBIC Blood Culture adequate volume   Culture   Final    NO GROWTH 5 DAYS Performed at [facility], [place] [address] [place], [place] [address]    Report Status 11/13/2020 FINAL  Final  Culture, blood (routine x 2)  Status: None   Collection Time: 11/08/20  8:59 PM   Specimen: BLOOD LEFT HAND  Result Value Ref Range Status   Specimen Description BLOOD LEFT HAND  Final   Special Requests   Final    BOTTLES DRAWN AEROBIC AND ANAEROBIC Blood Culture results may not be optimal due to an inadequate volume of blood received in culture bottles   Culture   Final    NO GROWTH 5 DAYS Performed at [facility], [place] [address], [place], [place] [address]    Report Status 11/13/2020 FINAL  Final          Radiology Studies: DG CHEST PORT 1 VIEW  Result Date: 11/15/2020 CLINICAL DATA:  PICC line placement EXAM: PORTABLE CHEST 1 VIEW COMPARISON:  Aug 23, 2020 FINDINGS: Right upper extremity central venous catheter tip over the distal SVC. Mild cardiomegaly. No focal opacity or pleural effusion. No pneumothorax IMPRESSION: Right upper extremity venous catheter tip over the distal SVC Electronically Signed   By: Donavan Foil M.D.   On: 11/15/2020 22:36        Scheduled Meds:  Chlorhexidine Gluconate Cloth  6 each Topical Daily   diclofenac Sodium  2 g Topical QID   folic acid  1 mg Oral Daily   heparin injection (subcutaneous)  5,000 Units Subcutaneous Q8H    morphine injection  4 mg Intravenous Once   predniSONE  40 mg Oral QAC breakfast   sodium chloride flush  3 mL Intravenous Q12H   Continuous Infusions:  sodium chloride     penicillin g continuous IV infusion 12 Million Units (11/16/20 1637)     LOS: 7 days        Hosie Poisson, MD [facility]   To contact the attending provider  between 7A-7P or the covering provider during after hours 7P-7A, please log into the web site [email or web address] and access using universal [place] password for that web site. If you do not have the password, please call the hospital operator.  11/16/2020, 4:52 PM

## 2020-11-17 LAB — CBC WITH DIFFERENTIAL/PLATELET
Abs Immature Granulocytes: 0.1 10*3/uL — ABNORMAL HIGH (ref 0.00–0.07)
Basophils Absolute: 0 10*3/uL (ref 0.0–0.1)
Basophils Relative: 0 %
Eosinophils Absolute: 0 10*3/uL (ref 0.0–0.5)
Eosinophils Relative: 0 %
HCT: 32.9 % — ABNORMAL LOW (ref 39.0–52.0)
Hemoglobin: 10.5 g/dL — ABNORMAL LOW (ref 13.0–17.0)
Immature Granulocytes: 1 %
Lymphocytes Relative: 29 %
Lymphs Abs: 2.9 10*3/uL (ref 0.7–4.0)
MCH: 27.6 pg (ref 26.0–34.0)
MCHC: 31.9 g/dL (ref 30.0–36.0)
MCV: 86.4 fL (ref 80.0–100.0)
Monocytes Absolute: 1 10*3/uL (ref 0.1–1.0)
Monocytes Relative: 10 %
Neutro Abs: 5.9 10*3/uL (ref 1.7–7.7)
Neutrophils Relative %: 60 %
Platelets: 641 10*3/uL — ABNORMAL HIGH (ref 150–400)
RBC: 3.81 MIL/uL — ABNORMAL LOW (ref 4.22–5.81)
RDW: 12.9 % (ref 11.5–15.5)
WBC: 9.9 10*3/uL (ref 4.0–10.5)
nRBC: 0 % (ref 0.0–0.2)

## 2020-11-17 LAB — BASIC METABOLIC PANEL
Anion gap: 9 (ref 5–15)
BUN: 9 mg/dL (ref 6–20)
CO2: 26 mmol/L (ref 22–32)
Calcium: 9.1 mg/dL (ref 8.9–10.3)
Chloride: 100 mmol/L (ref 98–111)
Creatinine, Ser: 0.72 mg/dL (ref 0.61–1.24)
GFR, Estimated: >60 mL/min (ref >60)
Glucose, Bld: 98 mg/dL (ref 70–99)
Potassium: 3.6 mmol/L (ref 3.5–5.1)
Sodium: 135 mmol/L (ref 135–145)

## 2020-11-17 MED ORDER — PREDNISONE 20 MG PO TABS
60.0000 mg | ORAL_TABLET | Freq: Every day | ORAL | Status: DC
Start: 2020-11-18 — End: 2020-11-18
  Administered 2020-11-18: 60 mg via ORAL
  Filled 2020-11-17: qty 3

## 2020-11-17 MED ORDER — HYDRALAZINE HCL 25 MG PO TABS: 25.0000 mg | ORAL_TABLET | Freq: Three times a day (TID) | ORAL | Status: DC | PRN

## 2020-11-17 NOTE — TOC Progression Note (Addendum)
Transition of Care [facility]) - Progression Note    Patient Details  Name: Juan Tanner MRN: [id number] Date of Birth: 05/13/1965  Transition of Care [facility]) CM/SW Contact  Bess Kinds, RN Phone Number: [phone] 11/17/2020, 3:51 PM  Clinical Narrative:     Notified by nursing concerning home infusion arrangements. Received call back from [facility]. She was not aware of this patient's situation. Advised to fax orders and clinical to [phone], and the order will be processed tomorrow.   Update 1625: Spoke with patient and wife at the bedside. They have not had any contact with [facility] agency to set up appointments. Patient stated that he was notified on Friday that they have to move, so he is not sure where they will be for [facility] follow up. Advised of TOC follow up tomorrow.   Expected Discharge Plan: Home w Home Health Services    Expected Discharge Plan and Services Expected Discharge Plan: Home w Home Health Services     Post Acute Care Choice: Home Health Living arrangements for the past 2 months: Single Family Home                 DME Arranged: N/A         HH Arranged: RN           Social Determinants of Health (SDOH) Interventions    Readmission Risk Interventions No flowsheet data found.
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
## 2020-11-17 NOTE — Progress Notes (Signed)
PROGRESS NOTE    Juan Tanner  TKZ:[id number] DOB: 11/06/1965 DOA: 11/08/2020 PCP: Pcp, No    Chief Complaint  Patient presents with   Leg Pain   Hand Pain   Flank Pain    Brief Narrative:    55 year old male with medical history of GERD, gout presents with right knee swelling and swelling of left wrist. MRI of the left wrist done yesterday showed large complex wrist joint effusion along with synovitis and diffuse inflammation along the joint.  No definite findings to suggest osteomyelitis.  He underwent right knee aspiration of the right knee growing streptococcus agalactiae sensitive to rocephin.  He was found to have elevated CRP, ESR and elevated CCP antibodies , positive for RA. We started the patient on IV solumedrol and transitioned to prednisone with improvement in the swelling and tenderness of the left wrist.   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  Synovial fluid cultures from the right knee growing streptococcus agalactiae sensitive to rocephin.  Uric acid wnl. RF is 30.4.  Elevated CRP and ESR. CCP Antibodies IgG/IgA elevated at 102. Started the patient on solumedrol, with improvement in the swelling and tenderness of the wrist,Transitioned to prednisone 60 mg daily .  Patient reports the swelling and pain in the wrist has improved but not completely resolved Will monitor for 24 more hours and discharge him in the morning. ID consulted for septic arthritis, recommended IV penicillin for 4 weeks.  PICC line placed Will need outpatient follow up with rheumatology to start DMARD'S  CRP elevated at 21 and ESR is 112. Pt will need teaching from the Home health RN prior to discharge.   Transaminitis:  Mild,  unclear etiology.  RUQ unremarkable.  Hepatitis panel is negative.    Vitamin b12 and folate deficiency:  - replacements ordered.    Leukocytosis:  Improving with IV antibiotics.  Monitor.  Recheck CBC in the morning shows normal WBC count.    Anemia of chronic disease;  Hemoglobin stable between 11-10   Hyponatremia:  Resolved.   GERD Stable.  Tachycardia and elevated BP:  Get EKG 12 LEAD and prn hydralazine ordered.  Recheck vitals in 1 hour.    DVT prophylaxis: heparin.  Code Status: Full code.  Family Communication: none at bedside.  Disposition:   Status is: Inpatient  Remains inpatient appropriate because:Ongoing diagnostic testing needed not appropriate for outpatient work up, Unsafe d/c plan, and IV treatments appropriate due to intensity of illness or inability to take PO  Dispo:  Patient From: Home  Planned Disposition: Home  Medically stable for discharge: No         Consultants:  Orthopedics.   Procedures: Aspiration of the right knee.   Antimicrobials:  Antibiotics Given (last 72 hours)     Date/Time Action Medication Dose Rate   11/14/20 1525 New Bag/Given   penicillin G potassium 12 Million Units in dextrose 5 % 500 mL continuous infusion 12 Million Units 41.7 mL/hr   11/15/20 0357 New Bag/Given   penicillin G potassium 12 Million Units in dextrose 5 % 500 mL continuous infusion 12 Million Units 41.7 mL/hr   11/15/20 1432 New Bag/Given   penicillin G potassium 12 Million Units in dextrose 5 %  500 mL continuous infusion 12 Million Units 41.7 mL/hr   11/16/20 0213 New Bag/Given   penicillin G potassium 12 Million Units in dextrose 5 % 500 mL continuous infusion 12 Million Units 41.7 mL/hr   11/16/20 1637 New Bag/Given   penicillin G potassium 12 Million Units in dextrose 5 % 500 mL continuous infusion 12 Million Units 41.7 mL/hr   11/17/20 0211 New Bag/Given   penicillin G potassium 12 Million Units in dextrose 5 % 500 mL continuous  infusion 12 Million Units 41.7 mL/hr        Subjective: Swelling of the wrist has improved.    Objective: Vitals:   11/16/20 1815 11/17/20 0113 11/17/20 0519 11/17/20 1129  BP: 138/81 135/79 140/86 (!) 171/106  Pulse: 98 89 83 (!) 123  Resp: 16 18 18 19   Temp: 98.7 F (37.1 C) 98 F (36.7 C) 97.8 F (36.6 C) 98.4 F (36.9 C)  TempSrc: Oral Oral Oral Oral  SpO2: 98% 99% 100% 97%  Weight:      Height:        Intake/Output Summary (Last 24 hours) at 11/17/2020 1444 Last data filed at 11/17/2020 0945 Gross per 24 hour  Intake 240 ml  Output --  Net 240 ml    Filed Weights   11/08/20 0948 11/08/20 1949  Weight: 83 kg 82.8 kg    Examination:  General exam: alert and comfortable.  Respiratory system: clear to auscultation, no wheezing or rhonchi.  Cardiovascular system: Tachycardic, no JVD, no pedal edema.  Gastrointestinal system: Abdomen is soft NT ND BS+ Central nervous system: Alert and oriented, non focal.  Extremities: Right knee swelling has resolved, left wrist swelling has improved.  Skin: no rashes seen.  Psychiatry: anxious looking.     Data Reviewed: I have personally reviewed following labs and imaging studies  CBC: Recent Labs  Lab 11/11/20 0409 11/12/20 0212 11/13/20 0310 11/14/20 0248 11/17/20 0330  WBC 17.2* 18.4* 16.5* 14.7* 9.9  NEUTROABS  --   --   --   --  5.9  HGB 12.2* 12.6* 11.8* 10.9* 10.5*  HCT 38.0* 39.4 37.2* 34.4* 32.9*  MCV 84.6 85.5 85.1 85.4 86.4  PLT 530* 543* 536* 535* 641*     Basic Metabolic Panel: Recent Labs  Lab 11/11/20 0409 11/12/20 0212 11/13/20 0310 11/14/20 0248 11/17/20 0330  NA 132* 129* 133* 132* 135  K 3.9 3.9 4.0 4.1 3.6  CL 100 96* 100 97* 100  CO2 22 23 25 26 26   GLUCOSE 110* 121* 119* 106* 98  BUN 14 13 13 10 9   CREATININE 0.76 0.81 0.89 0.80 0.72  CALCIUM 8.4* 8.7* 8.8* 8.6* 9.1     GFR: Estimated Creatinine Clearance: 104.5 mL/min (by C-G formula based on SCr of 0.72  mg/dL).  Liver Function Tests: Recent Labs  Lab 11/11/20 0409 11/12/20 0212 11/13/20 0310 11/14/20 0248  AST 42* 68* 48* 81*  ALT 89* 102* 95* 131*  ALKPHOS 75 85 91 96  BILITOT 0.8 0.7 1.0 0.8  PROT 7.8 7.8 7.8 7.8  ALBUMIN 2.3* 2.4* 2.3* 2.3*     CBG: No results for input(s): GLUCAP in the last 168 hours.   Recent Results (from the past 240 hour(s))  Body fluid culture w Gram Stain     Status: None   Collection Time: 11/08/20  2:04 PM   Specimen: Body Fluid  Result Value Ref Range Status   Specimen Description FLUID  Final   Special Requests SYNOVIAL RIGHT KNEE  Final  Gram Stain   Final    MODERATE WBC PRESENT, PREDOMINANTLY PMN NO ORGANISMS SEEN    Culture   Final    FEW STREPTOCOCCUS AGALACTIAE RESULT CALLED TO, READ BACK BY AND VERIFIED WITH: RN S.MEHAT ON [id number] AT 1245 BY E.PARRISH Performed at [facility], [place] [address] [place], [place] [address]    Report Status 11/11/2020 FINAL  Final   Organism ID, Bacteria STREPTOCOCCUS AGALACTIAE  Final      Susceptibility   Streptococcus agalactiae - MIC*    CLINDAMYCIN RESISTANT Resistant     AMPICILLIN <=0.25 SENSITIVE Sensitive     ERYTHROMYCIN 2 RESISTANT Resistant     VANCOMYCIN 0.5 SENSITIVE Sensitive     CEFTRIAXONE <=0.12 SENSITIVE Sensitive     LEVOFLOXACIN >=16 RESISTANT Resistant     PENICILLIN Value in next row Sensitive      SENSITIVE<=0.06    * FEW STREPTOCOCCUS AGALACTIAE  Resp Panel by RT-PCR (Flu A&B, Covid) Nasopharyngeal Swab     Status: None   Collection Time: 11/08/20  4:00 PM   Specimen: Nasopharyngeal Swab; Nasopharyngeal(NP) swabs in vial transport medium  Result Value Ref Range Status   SARS Coronavirus 2 by RT PCR NEGATIVE NEGATIVE Final    Comment: (NOTE) SARS-CoV-2 target nucleic acids are NOT DETECTED.  The SARS-CoV-2 RNA is generally detectable in upper respiratory specimens during the acute phase of infection. The lowest concentration of SARS-CoV-2 viral copies this  assay can detect is 138 copies/mL. A negative result does not preclude SARS-Cov-2 infection and should not be used as the sole basis for treatment or other patient management decisions. A negative result may occur with  improper specimen collection/handling, submission of specimen other than nasopharyngeal swab, presence of viral mutation(s) within the areas targeted by this assay, and inadequate number of viral copies(<138 copies/mL). A negative result must be combined with clinical observations, patient history, and epidemiological information. The expected result is Negative.  Fact Sheet for Patients:  EntrepreneurPulse.com.au  Fact Sheet for Healthcare Providers:  IncredibleEmployment.be  This test is no t yet approved or cleared by the [place] FDA and  has been authorized for detection and/or diagnosis of SARS-CoV-2 by FDA under an Emergency Use Authorization (EUA). This EUA will remain  in effect (meaning this test can be used) for the duration of the COVID-19 declaration under Section 564(b)(1) of the Act, 21 U.S.C.section 360bbb-3(b)(1), unless the authorization is terminated  or revoked sooner.       Influenza A by PCR NEGATIVE NEGATIVE Final   Influenza B by PCR NEGATIVE NEGATIVE Final    Comment: (NOTE) The Xpert Xpress SARS-CoV-2/FLU/RSV plus assay is intended as an aid in the diagnosis of influenza from Nasopharyngeal swab specimens and should not be used as a sole basis for treatment. Nasal washings and aspirates are unacceptable for Xpert Xpress SARS-CoV-2/FLU/RSV testing.  Fact Sheet for Patients: EntrepreneurPulse.com.au  Fact Sheet for Healthcare Providers: IncredibleEmployment.be  This test is not yet approved or cleared by the [place] FDA and has been authorized for detection and/or diagnosis of SARS-CoV-2 by FDA under an Emergency Use Authorization (EUA). This EUA will  remain in effect (meaning this test can be used) for the duration of the COVID-19 declaration under Section 564(b)(1) of the Act, 21 U.S.C. section 360bbb-3(b)(1), unless the authorization is terminated or revoked.  Performed at [facility], [place] [address], [place], [place] [address]   Culture, blood (routine x 2)     Status: None   Collection  Time: 11/08/20  8:54 PM   Specimen: BLOOD  Result Value Ref Range Status   Specimen Description BLOOD LEFT ANTECUBITAL  Final   Special Requests   Final    BOTTLES DRAWN AEROBIC AND ANAEROBIC Blood Culture adequate volume   Culture   Final    NO GROWTH 5 DAYS Performed at [facility], [address] [place], [place] [address]    Report Status 11/13/2020 FINAL  Final  Culture, blood (routine x 2)     Status: None   Collection Time: 11/08/20  8:59 PM   Specimen: BLOOD LEFT HAND  Result Value Ref Range Status   Specimen Description BLOOD LEFT HAND  Final   Special Requests   Final    BOTTLES DRAWN AEROBIC AND ANAEROBIC Blood Culture results may not be optimal due to an inadequate volume of blood received in culture bottles   Culture   Final    NO GROWTH 5 DAYS Performed at [facility], [place] [address], [place], [place] [address]    Report Status 11/13/2020 FINAL  Final          Radiology Studies: DG CHEST PORT 1 VIEW  Result Date: 11/15/2020 CLINICAL DATA:  PICC line placement EXAM: PORTABLE CHEST 1 VIEW COMPARISON:  Aug 23, 2020 FINDINGS: Right upper extremity central venous catheter tip over the distal SVC. Mild cardiomegaly. No focal opacity or pleural effusion. No pneumothorax IMPRESSION: Right upper extremity venous catheter tip over the distal SVC Electronically Signed   By: Donavan Foil M.D.   On: 11/15/2020 22:36        Scheduled Meds:  Chlorhexidine Gluconate Cloth  6 each Topical Daily   diclofenac Sodium  2 g Topical QID   folic acid  1 mg Oral Daily   heparin injection (subcutaneous)  5,000  Units Subcutaneous Q8H    morphine injection  4 mg Intravenous Once   [START ON 11/18/2020] predniSONE  60 mg Oral QAC breakfast   sodium chloride flush  3 mL Intravenous Q12H   Continuous Infusions:  sodium chloride     penicillin g continuous IV infusion 12 Million Units (11/17/20 0211)     LOS: 8 days        Hosie Poisson, MD [facility]   To contact the attending provider between 7A-7P or the covering provider during after hours 7P-7A, please log into the web site [email or web address] and access using universal [place] password for that web site. If you do not have the password, please call the hospital operator.  11/17/2020, 2:44 PM

## 2020-11-18 ENCOUNTER — Inpatient Hospital Stay (HOSPITAL_COMMUNITY): Payer: BLUE CROSS/BLUE SHIELD

## 2020-11-18 LAB — URIC ACID: Uric Acid, Serum: 3.6 mg/dL — ABNORMAL LOW (ref 3.7–8.6)

## 2020-11-18 LAB — TROPONIN I (HIGH SENSITIVITY)
Troponin I (High Sensitivity): 5 ng/L (ref <18)
Troponin I (High Sensitivity): 4 ng/L (ref <18)

## 2020-11-18 MED ORDER — CYANOCOBALAMIN 1000 MCG PO TABS: 1000.0000 ug | ORAL_TABLET | Freq: Every day | ORAL | 1 refills | Status: DC

## 2020-11-18 MED ORDER — PREDNISONE 20 MG PO TABS
40.0000 mg | ORAL_TABLET | Freq: Every day | ORAL | Status: AC
  Administered 2020-11-19: 40 mg via ORAL
  Filled 2020-11-18: qty 2

## 2020-11-18 MED ORDER — AMLODIPINE BESYLATE 5 MG PO TABS
5.0000 mg | ORAL_TABLET | Freq: Every day | ORAL | Status: DC
  Administered 2020-11-18 – 2020-11-19 (×2): 5 mg via ORAL
  Filled 2020-11-18: qty 1

## 2020-11-18 MED ORDER — PANTOPRAZOLE SODIUM 40 MG PO TBEC
40.0000 mg | DELAYED_RELEASE_TABLET | Freq: Every day | ORAL | Status: DC
  Administered 2020-11-18 – 2020-11-19 (×2): 40 mg via ORAL
  Filled 2020-11-18: qty 1

## 2020-11-18 MED ORDER — VITAMIN B-12 1000 MCG PO TABS
1000.0000 ug | ORAL_TABLET | Freq: Every day | ORAL | Status: DC
  Administered 2020-11-18 – 2020-11-19 (×2): 1000 ug via ORAL
  Filled 2020-11-18 (×2): qty 1

## 2020-11-18 MED ORDER — AMLODIPINE BESYLATE 5 MG PO TABS: 5.0000 mg | ORAL_TABLET | Freq: Every day | ORAL | 0 refills | Status: DC

## 2020-11-18 MED ORDER — IOHEXOL 350 MG/ML SOLN
65.0000 mL | Freq: Once | INTRAVENOUS | Status: AC | PRN
  Administered 2020-11-18: 65 mL via INTRAVENOUS

## 2020-11-18 MED ORDER — PREDNISONE 20 MG PO TABS: 20.0000 mg | ORAL_TABLET | Freq: Every day | ORAL | 0 refills | Status: DC

## 2020-11-18 NOTE — TOC Progression Note (Signed)
Transition of Care [facility]) - Progression Note    Patient Details  Name: Juan Tanner MRN: [id number] Date of Birth: 1966/03/02  Transition of [facility]) CM/SW Contact  Bess Kinds, RN Phone Number: [phone] 11/18/2020, 2:42 PM  Clinical Narrative:     Spoke with patient at the bedside. Patient provided new address for where he will be staying after discharge: [address] [place], [place] [address].   Spoke with Debbie at [facility] [phone] option 5. Provided address for patient. Debbie to find [facility] RN to follow up tomorrow afternoon (most likely). Patient will need to be disconnected from antibiotics just prior to discharge with a brief interruption in antibiotics until [facility] RN can meet with patient and start infusion. TOC contact information for today and tomorrow provided to Debbie.   Patient aware that he will be here another night. Patient is currently pending transport for CT scan.   TOC following.   Expected Discharge Plan: Home w Home Health Services    Expected Discharge Plan and Services Expected Discharge Plan: Home w Home Health Services     Post Acute Care Choice: Home Health Living arrangements for the past 2 months: Single Family Home Expected Discharge Date: 11/18/20               DME Arranged: N/A         HH Arranged: RN           Social Determinants of Health (SDOH) Interventions    Readmission Risk Interventions No flowsheet data found.

## 2020-11-18 NOTE — TOC Progression Note (Signed)
Transition of Care [facility]) - Progression Note    Patient Details  Name: Juan Tanner MRN: [id number] Date of Birth: [birth year]  [facility]) CM/SW Contact  Bess Kinds, RN Phone Number: [phone] 11/18/2020, 9:07 AM  Clinical Narrative:     Sherron Monday with Debbie at [facility] [phone] option 3]. Clinical documents were not received by end of day on Friday. Clinical documents faxed yesterday to [phone] confirmed as received minus the OPAT prescription. Prescription faxed this AM.   Expected Discharge Plan: Home w Home Health Services    Expected Discharge Plan and Services Expected Discharge Plan: Home w Home Health Services     Post Acute Care Choice: Home Health Living arrangements for the past 2 months: Single Family Home                 DME Arranged: N/A         HH Arranged: RN           Social Determinants of Health (SDOH) Interventions    Readmission Risk Interventions No flowsheet data found.

## 2020-11-18 NOTE — Progress Notes (Signed)
PROGRESS NOTE    Juan Tanner  ZRA:[id number] DOB: Aug 17, 1965 DOA: 11/08/2020 PCP: Pcp, No   Brief Narrative:  55 year old male with medical history of GERD, gout presents with right knee swelling and swelling of left wrist. MRI of the left wrist  showed large complex wrist joint effusion along with synovitis and diffuse inflammation along the joint.  No definite findings to suggest osteomyelitis.  He underwent right knee aspiration of the right knee growing streptococcus agalactiae sensitive to penicillin.  He was found to have elevated CRP, ESR and elevated CCP antibodies , positive for RA. We started the patient on IV solumedrol and transitioned to prednisone with improvement in the swelling and tenderness of the left wrist. Plan to discontinue prednisone at discharge.   [facility] contacted. They will contact patient to arrange follow up.     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  Synovial fluid cultures from the right knee growing streptococcus agalactiae.  Uric acid wnl. RF is 30.4.  Elevated CRP and ESR. CCP Antibodies IgG/IgA elevated at 102.  Received IV solumedrol, transition to prednisone, taper dose. Plan not to discharge on prednisone due to acute infectious process.  ID consulted for septic arthritis, recommended IV penicillin for 4 weeks.  PICC line placed Will need outpatient follow up with rheumatology to start DMARD'S when infection resolved.  CRP elevated at 21 and ESR is 112.  CM arranging [facility] for IV antibiotics.   Transaminitis:  Might be related to infectious process.  RUQ US  unremarkable.  Hepatitis panel is negative.   Chest Pressure, tachycardia;  He report chest pressure,  something stock in his throat.  EKG no ST elevation.  Check troponin.  Check CT angio.   Vitamin b12 and folate deficiency:  -continue with folic acid.  -Start oral B-12 supplement.   Leukocytosis:  Improving with IV antibiotics.  Resolved.  Secondary to infection.    Anemia of chronic disease; B 12 deficiency  Stable. HB 10---11   Hyponatremia:  Resolved.   GERD Resume PPI/   Tachycardia and elevated BP:  Start Norvasc.  Report chest pressure, plan to check CTA rule out PE>   DVT prophylaxis: heparin.  Code Status: Full code.  Family Communication: none at bedside.  Disposition:   Status is: Inpatient  Remains inpatient appropriate because:Ongoing diagnostic testing needed not appropriate for outpatient work up, Unsafe d/c plan, and IV treatments appropriate due to intensity of illness or inability to take PO  Dispo:  Patient From: Home  Planned Disposition: Home  Medically stable for discharge: No         Consultants:  Orthopedics.   Procedures: Aspiration of the right knee.   Antimicrobials:  Antibiotics Given (last 72 hours)     Date/Time Action Medication Dose Rate   11/16/20 0213 New Bag/Given   penicillin G potassium 12 Million Units in dextrose 5 % 500 mL continuous infusion 12 Million Units 41.7 mL/hr   11/16/20 1637 New Bag/Given   penicillin G potassium 12 Million Units in dextrose 5 % 500 mL continuous infusion 12 Million Units 41.7 mL/hr   11/17/20 0211 New Bag/Given   penicillin G potassium 12 Million Units in dextrose 5 % 500 mL  continuous infusion 12 Million Units 41.7 mL/hr   11/17/20 1550 New Bag/Given   penicillin G potassium 12 Million Units in dextrose 5 % 500 mL continuous infusion 12 Million Units 41.7 mL/hr   11/18/20 0559 New Bag/Given   penicillin G potassium 12 Million Units in dextrose 5 % 500 mL continuous infusion 12 Million Units 41.7 mL/hr        Subjective: He report feels something in his throat, and report chest  pressure. Started yesterday.  Right knee pain significantly improved.  Left wrist pain improving.    Objective: Vitals:   11/18/20 0512 11/18/20 0720 11/18/20 1245 11/18/20 1321  BP: 123/74 (!) 144/83 (!) 147/86 (!) 154/97  Pulse: 97 (!) 112 (!) 121 100  Resp: 18 20 19 18   Temp: 97.9 F (36.6 C) 98.1 F (36.7 C) 98.3 F (36.8 C)   TempSrc: Oral Oral Oral   SpO2: 98% 97% 97% 100%  Weight:      Height:        Intake/Output Summary (Last 24 hours) at 11/18/2020 1508 Last data filed at 11/18/2020 1247 Gross per 24 hour  Intake 480 ml  Output --  Net 480 ml    Filed Weights   11/08/20 0948 11/08/20 1949  Weight: 83 kg 82.8 kg    Examination:  General exam: Alert Respiratory system: CTA Cardiovascular system: Tachycardic, S 1,, S 2 RRR Gastrointestinal system: BS present, soft, nt Central nervous system: Alert Extremities: Right knee no swelling, mild swelling left wrist.  Skin: no rashes    Data Reviewed: I have personally reviewed following labs and imaging studies  CBC: Recent Labs  Lab 11/12/20 0212 11/13/20 0310 11/14/20 0248 11/17/20 0330  WBC 18.4* 16.5* 14.7* 9.9  NEUTROABS  --   --   --  5.9  HGB 12.6* 11.8* 10.9* 10.5*  HCT 39.4 37.2* 34.4* 32.9*  MCV 85.5 85.1 85.4 86.4  PLT 543* 536* 535* 641*     Basic Metabolic Panel: Recent Labs  Lab 11/12/20 0212 11/13/20 0310 11/14/20 0248 11/17/20 0330  NA 129* 133* 132* 135  K 3.9 4.0 4.1 3.6  CL 96* 100 97* 100  CO2 23 25 26 26   GLUCOSE 121* 119* 106* 98  BUN 13 13 10 9   CREATININE 0.81 0.89 0.80 0.72  CALCIUM 8.7* 8.8* 8.6* 9.1     GFR: Estimated Creatinine Clearance: 104.5 mL/min (by C-G formula based on SCr of 0.72 mg/dL).  Liver Function Tests: Recent Labs  Lab 11/12/20 0212 11/13/20 0310 11/14/20 0248  AST 68* 48* 81*  ALT 102* 95* 131*  ALKPHOS 85 91 96  BILITOT 0.7 1.0 0.8  PROT 7.8 7.8 7.8  ALBUMIN 2.4* 2.3* 2.3*     CBG: No results for input(s): GLUCAP in the  last 168 hours.   Recent Results (from the past 240 hour(s))  Resp Panel by RT-PCR (Flu A&B, Covid) Nasopharyngeal Swab     Status: None   Collection Time: 11/08/20  4:00 PM   Specimen: Nasopharyngeal Swab; Nasopharyngeal(NP) swabs in vial transport medium  Result Value Ref Range Status   SARS Coronavirus 2 by RT PCR NEGATIVE NEGATIVE Final    Comment: (NOTE) SARS-CoV-2 target nucleic acids are NOT DETECTED.  The SARS-CoV-2 RNA is generally detectable in upper respiratory specimens during the acute phase of infection. The lowest concentration of SARS-CoV-2 viral copies this assay can detect is 138 copies/mL. A negative result does not preclude SARS-Cov-2 infection and should not be used as the sole basis for  treatment or other patient management decisions. A negative result may occur with  improper specimen collection/handling, submission of specimen other than nasopharyngeal swab, presence of viral mutation(s) within the areas targeted by this assay, and inadequate number of viral copies(<138 copies/mL). A negative result must be combined with clinical observations, patient history, and epidemiological information. The expected result is Negative.  Fact Sheet for Patients:  EntrepreneurPulse.com.au  Fact Sheet for Healthcare Providers:  IncredibleEmployment.be  This test is no t yet approved or cleared by the [place] FDA and  has been authorized for detection and/or diagnosis of SARS-CoV-2 by FDA under an Emergency Use Authorization (EUA). This EUA will remain  in effect (meaning this test can be used) for the duration of the COVID-19 declaration under Section 564(b)(1) of the Act, 21 U.S.C.section 360bbb-3(b)(1), unless the authorization is terminated  or revoked sooner.       Influenza A by PCR NEGATIVE NEGATIVE Final   Influenza B by PCR NEGATIVE NEGATIVE Final    Comment: (NOTE) The Xpert Xpress SARS-CoV-2/FLU/RSV plus assay is  intended as an aid in the diagnosis of influenza from Nasopharyngeal swab specimens and should not be used as a sole basis for treatment. Nasal washings and aspirates are unacceptable for Xpert Xpress SARS-CoV-2/FLU/RSV testing.  Fact Sheet for Patients: EntrepreneurPulse.com.au  Fact Sheet for Healthcare Providers: IncredibleEmployment.be  This test is not yet approved or cleared by the [place] FDA and has been authorized for detection and/or diagnosis of SARS-CoV-2 by FDA under an Emergency Use Authorization (EUA). This EUA will remain in effect (meaning this test can be used) for the duration of the COVID-19 declaration under Section 564(b)(1) of the Act, 21 U.S.C. section 360bbb-3(b)(1), unless the authorization is terminated or revoked.  Performed at [facility], [place] [address] [place], [place] [address]   Culture, blood (routine x 2)     Status: None   Collection Time: 11/08/20  8:54 PM   Specimen: BLOOD  Result Value Ref Range Status   Specimen Description BLOOD LEFT ANTECUBITAL  Final   Special Requests   Final    BOTTLES DRAWN AEROBIC AND ANAEROBIC Blood Culture adequate volume   Culture   Final    NO GROWTH 5 DAYS Performed at [facility], [place] [address], [place], [place] [address]    Report Status 11/13/2020 FINAL  Final  Culture, blood (routine x 2)     Status: None   Collection Time: 11/08/20  8:59 PM   Specimen: BLOOD LEFT HAND  Result Value Ref Range Status   Specimen Description BLOOD LEFT HAND  Final   Special Requests   Final    BOTTLES DRAWN AEROBIC AND ANAEROBIC Blood Culture results may not be optimal due to an inadequate volume of blood received in culture bottles   Culture   Final    NO GROWTH 5 DAYS Performed at [facility], [place] [address], [place], [place] [address]    Report Status 11/13/2020 FINAL  Final          Radiology Studies: No results found.      Scheduled  Meds:  amLODipine  5 mg Oral Daily   Chlorhexidine Gluconate Cloth  6 each Topical Daily   diclofenac Sodium  2 g Topical QID   folic acid  1 mg Oral Daily   heparin injection (subcutaneous)  5,000 Units Subcutaneous Q8H    morphine injection  4 mg Intravenous Once   pantoprazole  40 mg Oral Daily   [  START ON 11/19/2020] predniSONE  40 mg Oral QAC breakfast   sodium chloride flush  3 mL Intravenous Q12H   Continuous Infusions:  sodium chloride     penicillin g continuous IV infusion 12 Million Units (11/18/20 0559)     LOS: 9 days        Omarii Scalzo Desiree Lucy, MD [facility]   To contact the attending provider between 7A-7P or the covering provider during after hours 7P-7A, please log into the web site [email or web address] and access using universal [place] password for that web site. If you do not have the password, please call the hospital operator.  11/18/2020, 3:08 PM

## 2020-11-18 NOTE — Progress Notes (Signed)
This RN to bedside for [facility] consult. Sluggish blood return noted. Small sample taken to send light green tube. Phlebotomy called to make aware that if tube not enough to run, patient will need to be lab draw until PICC blood return can be addressed.

## 2020-11-19 ENCOUNTER — Telehealth: Payer: Self-pay | Admitting: Surgery

## 2020-11-19 MED ORDER — PANTOPRAZOLE SODIUM 40 MG PO TBEC: 40.0000 mg | DELAYED_RELEASE_TABLET | Freq: Two times a day (BID) | ORAL | 0 refills | Status: DC

## 2020-11-19 NOTE — Discharge Summary (Signed)
Physician Discharge Summary  Juan Tanner MGQ:[id number] DOB: 1966-01-21 DOA: 11/08/2020  PCP: Pcp, No  Admit date: 11/08/2020 Discharge date: 11/19/2020  Admitted From: Home Disposition:  Home with [facility]  Recommendations for Outpatient Follow-up:  Follow up with PCP in 1-2 weeks Please obtain BMP/CBC in one week Needs to follow up with ID 11/28/2020 at 3;30 PM [facility] will contact patient for appointment.   Home Health: Yes, HH for IV antibiotics.   Discharge Condition: Stable.  CODE STATUS:Full code Diet recommendation: Heart Healthy   Brief/Interim Summary: 55 year old male with medical history of GERD, gout presents with right knee swelling and swelling of left wrist. MRI of the left wrist  showed large complex wrist joint effusion along with synovitis and diffuse inflammation along the joint.  No definite findings to suggest osteomyelitis.  He underwent right knee aspiration of the right knee growing streptococcus agalactiae sensitive to penicillin.  He was found to have elevated CRP, ESR and elevated CCP antibodies , positive for RA. We started the patient on IV solumedrol and transitioned to prednisone with improvement in the swelling and tenderness of the left wrist. Plan to discontinue prednisone at discharge.    [facility] contacted. They will contact patient to arrange follow up.     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  Synovial fluid cultures from the right knee growing streptococcus agalactiae.  Uric acid wnl. RF is 30.4.  Elevated CRP and ESR. CCP Antibodies IgG/IgA elevated at 102.  Received IV solumedrol, transition to prednisone, taper dose. Plan not to discharge on prednisone due to acute infectious process.  ID consulted for septic arthritis, recommended IV penicillin for 4 weeks.  PICC line placed Will need outpatient follow up with rheumatology to start DMARD'S when  infection resolved.  CRP elevated at 21 and ESR is 112.  CM arranging [place] for IV antibiotics.   Home today if [facility] is  arrange.   Transaminitis:  Might be related to infectious process.  RUQ US  unremarkable.  Hepatitis panel is negative.    Chest Pressure:  He report chest pressure, something stock in his throat.  Troponin time 2 normal.   CTA chest negative for PE>  Probably related to reflux, improved with PPI.   Vitamin b12 and folate deficiency:  -Continue with folic acid.  -Started  oral B-12 supplement.    Leukocytosis:  Improving with IV antibiotics.  Resolved.  Secondary to infection.      Anemia of chronic disease; B 12 deficiency  Stable. HB 10---11     Hyponatremia:  Resolved.    GERD Resume PPI/ change to BID to help with reflux symptoms.    HTN;  Started Norvasc. BP improved. Controlled.  Report chest pressure, CTA negative for PE.    Discharge Diagnoses:  Principal Problem:   Cellulitis of right lower leg Active Problems:   Effusion of right knee   Joint pain   Transaminitis   Leukocytosis   Cellulitis of right lower extremity    Discharge Instructions  Discharge Instructions     [facility] pharmacist to adjust dose for Vancomycin, Aminoglycosides and other anti-infective therapies as requested by physician.   Complete by: As directed    [facility] to provide Cath Flo 29m   Complete by: As directed    Administer for PICC line occlusion and as ordered by physician for other access device issues.   Ambulatory referral to Rheumatology  Complete by: As directed    Polyarthritis, elevated RF. Anti ccp pending.   Anaphylaxis Kit: Provided to treat any anaphylactic reaction to the medication being provided to the patient if First Dose or when requested by physician   Complete by: As directed    Epinephrine 85m/ml vial / amp: Administer 0.362m(0.66m5msubcutaneously once for moderate to severe anaphylaxis, nurse to call  physician and pharmacy when reaction occurs and call 911 if needed for immediate care   Diphenhydramine 29m49m IV vial: Administer 25-29mg87mIM PRN for first dose reaction, rash, itching, mild reaction, nurse to call physician and pharmacy when reaction occurs   Sodium Chloride 0.9% NS 500ml 29mAdminister if needed for hypovolemic blood pressure drop or as ordered by physician after call to physician with anaphylactic reaction   Change dressing on IV access line weekly and PRN   Complete by: As directed    Diet - low sodium heart healthy   Complete by: As directed    Diet - low sodium heart healthy   Complete by: As directed    Discharge wound care:   Complete by: As directed    na   Flush IV access with Sodium Chloride 0.9% and Heparin 10 units/ml or 100 units/ml   Complete by: As directed    Home infusion instructions - [facility]   Complete by: As directed    Instructions: Flush IV access with Sodium Chloride 0.9% and Heparin 10units/ml or 100units/ml   Change dressing on IV access line: Weekly and PRN   Instructions Cath Flo 2mg: A47mnister for PICC Line occlusion and as ordered by physician for other access device   [facility] pharmacist to adjust dose for: Vancomycin, Aminoglycosides and other anti-infective therapies as requested by physician   Increase activity slowly   Complete by: As directed    Increase activity slowly   Complete by: As directed    Method of administration may be changed at the discretion of home infusion pharmacist based upon assessment of the patient and/or caregiver's ability to self-administer the medication ordered   Complete by: As directed    No wound care   Complete by: As directed       Allergies as of 11/19/2020   No Known Allergies      Medication List     STOP taking these medications    HYDROcodone-acetaminophen 5-325 MG tablet Commonly known as: NORCO/VICODIN   lidocaine 5 % Commonly known as: Lidoderm        TAKE these medications    acetaminophen 500 MG tablet Commonly known as: TYLENOL Take 1,000 mg by mouth every 6 (six) hours as needed for mild pain, moderate pain or headache.   amLODipine 5 MG tablet Commonly known as: NORVASC Take 1 tablet (5 mg total) by mouth daily.   cyanocobalamin 1000 MCG tablet Take 1 tablet (1,000 mcg total) by mouth daily.   folic acid 1 MG tablet Commonly known as: FOLVITE Take 1 tablet (1 mg total) by mouth daily.   indomethacin 25 MG capsule Commonly known as: INDOCIN Take 1 capsule (25 mg total) by mouth 3 (three) times daily as needed. What changed: reasons to take this   pantoprazole 40 MG tablet Commonly known as: PROTONIX Take 1 tablet (40 mg total) by mouth 2 (two) times daily. What changed:  medication strength how much to take when to take this   penicillin G  IVPB Inject 24 Million Units into the vein daily for 22 days.  As a continuous infusion Indication:  Native joint septic arthritis First Dose: No Last Day of Therapy:  12/06/20 Labs - Once weekly:  CBC/D and BMP, Labs - Every other week:  ESR and CRP Method of administration: Elastomeric (Continuous infusion) Method of administration may be changed at the discretion of home infusion pharmacist based upon assessment of the patient and/or caregiver's ability to self-administer the medication ordered.               Discharge Care Instructions  (From admission, onward)           Start     Ordered   11/19/20 0000  Discharge wound care:       Comments: na   11/19/20 0933   11/15/20 0000  Change dressing on IV access line weekly and PRN  (Home infusion instructions - [facility] )        11/15/20 1100            Follow-up Information     Rosiland Oz, MD. Go on 11/28/2020.   Specialty: Infectious Diseases Why: Appointment on 11/28/20 at 3:30pm Contact information: [place] [address] [phone]                 No Known Allergies  Consultations: ID Ortho   Procedures/Studies: DG Elbow Complete Right  Result Date: 11/01/2020 CLINICAL DATA:  Nontraumatic pain and swelling EXAM: RIGHT ELBOW - COMPLETE 3+ VIEW COMPARISON:  None. FINDINGS: There is no evidence of fracture, dislocation, or joint effusion. There is no evidence of arthropathy or other focal bone abnormality. Soft tissues are unremarkable. IMPRESSION: Negative. Electronically Signed   By: Franchot Gallo M.D.   On: 11/01/2020 14:30   DG Wrist Complete Left  Result Date: 11/01/2020 CLINICAL DATA:  Nontraumatic swelling and pain of the left wrist. EXAM: LEFT WRIST - COMPLETE 3+ VIEW COMPARISON:  None. FINDINGS: No signs of acute fracture or dislocation identified. The joint spaces appear normal. No suspicious bone erosions identified. Mild soft tissue swelling is identified along the dorsum of the wrist. IMPRESSION: 1. No acute bone abnormality. 2. Mild dorsal soft tissue swelling. Electronically Signed   By: Kerby Moors M.D.   On: 11/01/2020 14:28   DG Knee 2 Views Right  Result Date: 11/08/2020 CLINICAL DATA:  Evaluate for effusion. EXAM: RIGHT KNEE - 1-2 VIEW COMPARISON:  None. FINDINGS: There is a small suprapatellar joint effusion. No acute fracture or dislocation. No radio-opaque foreign bodies are soft tissue calcifications. IMPRESSION: Small suprapatellar joint effusion. Electronically Signed   By: Kerby Moors M.D.   On: 11/08/2020 11:09   CT Angio Chest Pulmonary Embolism (PE) W or WO Contrast  Result Date: 11/18/2020 CLINICAL DATA:  Chest heaviness. EXAM: CT ANGIOGRAPHY CHEST WITH CONTRAST TECHNIQUE: Multidetector CT imaging of the chest was performed using the standard protocol during bolus administration of intravenous contrast. Multiplanar CT image reconstructions and MIPs were obtained to evaluate the vascular anatomy. CONTRAST:  56m OMNIPAQUE IOHEXOL 350 MG/ML SOLN COMPARISON:  January 09, 2019. FINDINGS:  Cardiovascular: Satisfactory opacification of the pulmonary arteries to the segmental level. No evidence of pulmonary embolism. Mild cardiomegaly is noted. No pericardial effusion. Mediastinum/Nodes: No enlarged mediastinal, hilar, or axillary lymph nodes. Thyroid gland, trachea, and esophagus demonstrate no significant findings. Lungs/Pleura: Lungs are clear. No pleural effusion or pneumothorax. Upper Abdomen: No acute abnormality. Musculoskeletal: No chest wall abnormality. No acute or significant osseous findings. Review of the MIP images confirms the above findings.  IMPRESSION: No definite evidence of pulmonary embolus. No acute pulmonary abnormality seen. Electronically Signed   By: Marijo Conception M.D.   On: 11/18/2020 15:57   MR WRIST LEFT WO CONTRAST  Result Date: 11/10/2020 CLINICAL DATA:  Wrist pain and swelling for 1 week. EXAM: MR OF THE LEFT WRIST WITHOUT CONTRAST TECHNIQUE: Multiplanar, multisequence MR imaging of the left wrist was performed. No intravenous contrast was administered. COMPARISON:  Radiographs 11/01/2020 FINDINGS: Ligaments: The scaphoid ligament is attenuated and likely torn. The scapholunate joint space is mildly widened. The lunotriquetral ligament is intact. Triangular fibrocartilage: Central tear of the TFCC with fluid in the radioulnar joint space. Tendons: Moderate scattered tenosynovitis involving the dorsal wrist tendons and also the carpal tunnel tendons along with the flexor carpi radialis tendon outside the carpal tunnel. No tendon rupture. Carpal tunnel/median nerve: Fluid and synovitis in the carpal tunnel. The median nerve is unremarkable. Guyon's canal: No mass or mass effect on the ulnar nerve. Joint/cartilage: There is a large complex wrist joint effusion along with synovitis. There is also associated diffuse inflammation around the joint with subcutaneous inflammatory changes. I do not see any obvious full-thickness cartilage defects or discrete erosions.  Bones/carpal alignment: Carpal alignment is normal. No findings suspicious for osteomyelitis. Other: The hand and wrist musculature demonstrates areas of inflammation/myositis. No findings for pyomyositis. IMPRESSION: 1. Large complex wrist joint effusion along with synovitis and diffuse inflammation around the joint. Findings could be due to an inflammatory arthropathy or septic arthritis. Recommend joint aspiration. No definite MR findings to suggest osteomyelitis. 2. Moderate scattered tenosynovitis. 3. Central tear of the TFCC. 4. Suspect scapholunate ligament tear. Electronically Signed   By: Marijo Sanes M.D.   On: 11/10/2020 13:40   DG CHEST PORT 1 VIEW  Result Date: 11/15/2020 CLINICAL DATA:  PICC line placement EXAM: PORTABLE CHEST 1 VIEW COMPARISON:  Aug 23, 2020 FINDINGS: Right upper extremity central venous catheter tip over the distal SVC. Mild cardiomegaly. No focal opacity or pleural effusion. No pneumothorax IMPRESSION: Right upper extremity venous catheter tip over the distal SVC Electronically Signed   By: Donavan Foil M.D.   On: 11/15/2020 22:36   DG FLUORO GUIDED NEEDLE PLC ASPIRATION/INJECTION LOC  Result Date: 11/11/2020 CLINICAL DATA:  Left wrist swelling, warmth and leukocytosis with complex joint effusion on MRI. Positive cultures on recent knee aspiration. On antibiotics. FLUOROSCOPY TIME:  24 seconds of low-dose pulsed fluoroscopy. 0.2 mGy. PROCEDURE: LEFT RADIOCARPAL JOINT ASPIRATION UNDER FLUOROSCOPY COMPARISON:  MRI 11/10/2020. TECHNIQUE/FINDINGS: After reviewing the patient's recent MRI and chart, informed consent was obtained from the patient. The skin dorsal to the wrist was scrubbed with Betadine and draped in sterile fashion. Skin anesthesia was carried out using 1% Lidocaine. A 21 gauge needle was directed into the radiocarpal joint under fluoroscopic guidance. Initially, only a small amount of serosanguineous fluid was aspirated (less than 1 cc). The needle tip was  redirected several times without additional fluid being aspirated. Intra-articular position of the needle tip was confirmed by injecting a small amount of contrast in the joint. On injection of the joint, contrast is seen to extend through the previously demonstrated central tear of the triangular fibrocartilage complex. The joint was then localized with 5 cc of normal saline, and approximately 2 cc of this fluid was reaspirated and sent for the requested laboratory studies. IMPRESSION: Minimal serosanguineous fluid aspirated from the left radiocarpal joint. The joint was lavaged and some of this fluid was sent for the requested studies. Electronically Signed  By: Richardean Sale M.D.   On: 11/11/2020 11:20   VAS [place] LOWER EXTREMITY VENOUS (DVT) (ONLY MC & [facility] 7a-7p)  Result Date: 11/08/2020  Lower Venous DVT Study Patient Name:  HEZIKIAH RETZLOFF  Date of Exam:   11/08/2020 Medical Rec #: [id number]       Accession #:    [id number] Date of Birth: 1965-07-24        Patient Gender: M Patient Age:   41 years Exam Location:  [facility] Procedure:      VAS [place] LOWER EXTREMITY VENOUS (DVT) Referring Phys: Davonna Belling --------------------------------------------------------------------------------  Indications: Pain in RT foot, wound on lateral aspect of foot.  Comparison Study: No prior studies. Performing Technologist: Darlin Coco RDMS,RVT  Examination Guidelines: A complete evaluation includes B-mode imaging, spectral Doppler, color Doppler, and power Doppler as needed of all accessible portions of each vessel. Bilateral testing is considered an integral part of a complete examination. Limited examinations for reoccurring indications may be performed as noted. The reflux portion of the exam is performed with the patient in reverse Trendelenburg.  +---------+---------------+---------+-----------+----------+--------------+ RIGHT    CompressibilityPhasicitySpontaneityPropertiesThrombus Aging  +---------+---------------+---------+-----------+----------+--------------+ CFV      Full           Yes      Yes                                 +---------+---------------+---------+-----------+----------+--------------+ SFJ      Full                                                        +---------+---------------+---------+-----------+----------+--------------+ FV Prox  Full                                                        +---------+---------------+---------+-----------+----------+--------------+ FV Mid   Full                                                        +---------+---------------+---------+-----------+----------+--------------+ FV DistalFull                                                        +---------+---------------+---------+-----------+----------+--------------+ PFV      Full                                                        +---------+---------------+---------+-----------+----------+--------------+ POP      Full           Yes      Yes                                 +---------+---------------+---------+-----------+----------+--------------+  PTV      Full                                                        +---------+---------------+---------+-----------+----------+--------------+ PERO     Full                                                        +---------+---------------+---------+-----------+----------+--------------+ Gastroc  Full                                                        +---------+---------------+---------+-----------+----------+--------------+   +----+---------------+---------+-----------+----------+--------------+ LEFTCompressibilityPhasicitySpontaneityPropertiesThrombus Aging +----+---------------+---------+-----------+----------+--------------+ CFV Full           Yes      Yes                                  +----+---------------+---------+-----------+----------+--------------+     Summary: RIGHT: - There is no evidence of deep vein thrombosis in the lower extremity.  - No cystic structure found in the popliteal fossa.  LEFT: - No evidence of common femoral vein obstruction.  *See table(s) above for measurements and observations. Electronically signed by Jamelle Haring on 11/08/2020 at 5:29:17 PM.    Final    [place] EKG SITE RITE  Result Date: 11/14/2020 If Site Rite image not attached, placement could not be confirmed due to current cardiac rhythm.  US Abdomen Limited RUQ (LIVER/GB)  Result Date: 11/10/2020 CLINICAL DATA:  Transaminitis. EXAM: ULTRASOUND ABDOMEN LIMITED RIGHT UPPER QUADRANT COMPARISON:  CT of the abdomen and pelvis on 07/17/2018 FINDINGS: Gallbladder: Gallbladder has a normal appearance. Gallbladder wall is 2 millimeters, within normal limits. No stones or pericholecystic fluid. No sonographic Murphy's sign. Common bile duct: Diameter: 5 millimeters Liver: No focal lesion identified. Within normal limits in parenchymal echogenicity. Portal vein is patent on color Doppler imaging with normal direction of blood flow towards the liver. Other: None. IMPRESSION: Normal RIGHT UPPER QUADRANT ultrasound. Electronically Signed   By: Nolon Nations M.D.   On: 11/10/2020 12:34     Subjective: Chest pressure, reflux much better. Mild knee pain   Discharge Exam: Vitals:   11/18/20 2330 11/19/20 0450  BP: 125/73 126/76  Pulse: 96 97  Resp: 18 19  Temp: 98.5 F (36.9 C) 98.6 F (37 C)  SpO2: 98% 100%     General: Pt is alert, awake, not in acute distress Cardiovascular: RRR, S1/S2 +, no rubs, no gallops Respiratory: CTA bilaterally, no wheezing, no rhonchi Abdominal: Soft, NT, ND, bowel sounds + Extremities: no edema, no cyanosis    The results of significant diagnostics from this hospitalization (including imaging, microbiology, ancillary and laboratory) are listed below for reference.      Microbiology: No results found for this or any previous visit (from the past 240 hour(s)).   Labs: BNP (last 3 results) No results for input(s): BNP in the last 8760 hours. Basic Metabolic Panel: Recent Labs  Lab 11/13/20 0310  11/14/20 0248 11/17/20 0330  NA 133* 132* 135  K 4.0 4.1 3.6  CL 100 97* 100  CO2 _0 GLUCOSE 119* 106* 98  BUN _1 CREATININE 0.89 0.80 0.72  CALCIUM 8.8* 8.6* 9.1   Liver Function Tests: Recent Labs  Lab 11/13/20 0310 11/14/20 0248  AST 48* 81*  ALT 95* 131*  ALKPHOS 91 96  BILITOT 1.0 0.8  PROT 7.8 7.8  ALBUMIN 2.3* 2.3*   No results for input(s): LIPASE, AMYLASE in the last 168 hours. No results for input(s): AMMONIA in the last 168 hours. CBC: Recent Labs  Lab 11/13/20 0310 11/14/20 0248 11/17/20 0330  WBC 16.5* 14.7* 9.9  NEUTROABS  --   --  5.9  HGB 11.8* 10.9* 10.5*  HCT 37.2* 34.4* 32.9*  MCV 85.1 85.4 86.4  PLT 536* 535* 641*   Cardiac Enzymes: No results for input(s): CKTOTAL, CKMB, CKMBINDEX, TROPONINI in the last 168 hours. BNP: Invalid input(s): POCBNP CBG: No results for input(s): GLUCAP in the last 168 hours. D-Dimer No results for input(s): DDIMER in the last 72 hours. Hgb A1c No results for input(s): HGBA1C in the last 72 hours. Lipid Profile No results for input(s): CHOL, HDL, LDLCALC, TRIG, CHOLHDL, LDLDIRECT in the last 72 hours. Thyroid function studies No results for input(s): TSH, T4TOTAL, T3FREE, THYROIDAB in the last 72 hours.  Invalid input(s): FREET3 Anemia work up No results for input(s): VITAMINB12, FOLATE, FERRITIN, TIBC, IRON, RETICCTPCT in the last 72 hours. Urinalysis    Component Value Date/Time   APPEARANCEUR Clear 12/21/2017 1540   GLUCOSEU Negative 12/21/2017 1540   BILIRUBINUR Negative 12/21/2017 1540   KETONESUR trace (5) (A) 12/07/2017 1538   PROTEINUR Negative 12/21/2017 1540   UROBILINOGEN 1.0 12/07/2017 1538   NITRITE Negative 12/21/2017 1540   LEUKOCYTESUR  Negative 12/21/2017 1540   Sepsis Labs Invalid input(s): PROCALCITONIN,  WBC,  LACTICIDVEN Microbiology No results found for this or any previous visit (from the past 240 hour(s)).   Time coordinating discharge: 40 minutes  SIGNED:   Elmarie Shiley, MD  [facility]

## 2020-11-19 NOTE — Progress Notes (Signed)
RN gave patient discharge paperwork and the patient stated understanding. PICC line dressing changed by IV Team, IV ABX dropped off by [facility] and is in room. [facility] Nurse in room going over paperwork with the patient. Medications escribed to home pharmacy.

## 2020-11-19 NOTE — Care Management (Addendum)
Called Debbie at  [facility] [phone] regarding discharge today . Left message awaiting call back.   Debbie with [facility] called, so far they have not been able to arrange [facility], she is waiting to hear back from [facility] nursing   Discharge summary sent to Debbie at [facility], she has NCM direct cell number, if she does receive fax she will call.   [facility] still working on securing a HHRN. Patient aware.   1425 Penny with [facility] trying to have home ABX delivered to hospital room today, if that is possible [facility] will send a HHRN to patient's hospital room to connect him to home infusion prior to discharge. If this is possible patient states he can call wife and she will provide transportation home.   Confirmed address [address] [place], [place] [address] phone [phone].   NCM will let patient, nurse and MD know when plan is confirmed.    1625 Spoke to Debbie [phone]) at [facility]. Their driver left there 15 minutes ago with medication and is on way to [facility] however [address] is [address] to one lane due to construction.   Revonda Standard with [facility] [phone] aware of above and will have her nurse eat dinner and then come to hospital. If [facility] nurse gets here before medication , she will start paperwork with patient . Bedside nurse and patient aware.

## 2020-11-19 NOTE — Telephone Encounter (Addendum)
ED RN Care Manager received call from patient concerning a discharge prescription for Protonix  EC that [facility] cannot fill because of needing a Prior Authorization.  Attempted to send Dr. Sunnie Nielsen a message, but may have been gone for the day. ED RN CM will reach out to [facility] covering the [facility] to follow up with this patient.

## 2020-11-20 NOTE — Care Management (Signed)
Spoke to [employer] at [employer]. She will check to see if [facility] faxed over a prior authorization form, if not she will call [facility] and request one. She will have Production manager resolve prior authorization.

## 2020-11-25 ENCOUNTER — Ambulatory Visit: Payer: BLUE CROSS/BLUE SHIELD | Admitting: Internal Medicine

## 2020-11-25 ENCOUNTER — Encounter: Payer: Self-pay | Admitting: Infectious Diseases

## 2020-11-25 ENCOUNTER — Other Ambulatory Visit: Payer: Self-pay

## 2020-11-25 VITALS — BP 116/72 | HR 89 | Ht 65.0 in | Wt 171.4 lb

## 2020-11-25 DIAGNOSIS — R002 Palpitations: Secondary | ICD-10-CM

## 2020-11-25 DIAGNOSIS — I493 Ventricular premature depolarization: Secondary | ICD-10-CM | POA: Diagnosis not present

## 2020-11-25 NOTE — Progress Notes (Signed)
Electrophysiology Office Note   Date:  11/25/2020   ID:  Billyjoe Go, DOB 04/11/1965, MRN [id number]  PCP:  Pcp, No    Primary Electrophysiologist: Thompson Grayer, MD    CC:  PVCs   History of Present Illness: [facility]anner is a 55 y.o. male who presents today for electrophysiology evaluation.   He presents today to re-establish.  I saw him last in 2019. His recent issues have been primarily related to knee pain and arthritis. He was recently hospitalized with presumed septic wrist.  He is also being referred to rheumatology for consideration of RA as a potential cause. He has rare sharp chest pain.  + SOB with moderate activity but is currently quite limited. He did go to the ER in May with atypical chest pain. CTA 11/18/20 was unrevealing.  Today, he denies symptoms of palpitations, chest pain,  orthopnea, PND, lower extremity edema, claudication, dizziness, presyncope, syncope, bleeding, or neurologic sequela. The patient is tolerating medications without difficulties and is otherwise without complaint today.    Past Medical History:  Diagnosis Date   Blood in stool    GERD (gastroesophageal reflux disease)    Premature ventricular contraction    outflow tract pvcs   Past Surgical History:  Procedure Laterality Date   none       Current Outpatient Medications  Medication Sig Dispense Refill   acetaminophen (TYLENOL) 500 MG tablet Take 1,000 mg by mouth every 6 (six) hours as needed for mild pain, moderate pain or headache.     amLODipine (NORVASC) 5 MG tablet Take 1 tablet (5 mg total) by mouth daily. 30 tablet 0   folic acid (FOLVITE) 1 MG tablet Take 1 tablet (1 mg total) by mouth daily. 30 tablet 2   indomethacin (INDOCIN) 25 MG capsule Take 1 capsule (25 mg total) by mouth 3 (three) times daily as needed. 30 capsule 0   pantoprazole (PROTONIX) 40 MG tablet Take 1 tablet (40 mg total) by mouth 2 (two) times daily. 60 tablet 0   penicillin G IVPB Inject 24 Million  Units into the vein daily for 22 days. As a continuous infusion Indication:  Native joint septic arthritis First Dose: No Last Day of Therapy:  12/06/20 Labs - Once weekly:  CBC/D and BMP, Labs - Every other week:  ESR and CRP Method of administration: Elastomeric (Continuous infusion) Method of administration may be changed at the discretion of home infusion pharmacist based upon assessment of the patient and/or caregiver's ability to self-administer the medication ordered. 44 Units 0   vitamin B-12 1000 MCG tablet Take 1 tablet (1,000 mcg total) by mouth daily. 30 tablet 1   No current facility-administered medications for this visit.    Allergies:   Patient has no known allergies.   Social History:  The patient  reports that he has never smoked. He has never used smokeless tobacco. He reports that he does not drink alcohol and does not use drugs.   Family History:  + THN   ROS:  Please see the history of present illness.   All other systems are personally reviewed and negative.    PHYSICAL EXAM: VS:  BP 116/72   Pulse 89   Ht _0  (1.651 m)   Wt 171 lb 6.4 oz (77.7 kg)   SpO2 94%   BMI 28.52 kg/m  , BMI Body mass index is 28.52 kg/m. GEN: Well nourished, well developed, in no acute distress HEENT: normal Neck: no JVD, carotid bruits, or  masses Cardiac: RRR; no murmurs, rubs, or gallops,no edema  Respiratory:  clear to auscultation bilaterally, normal work of breathing GI: soft, nontender, nondistended, + BS MS: no deformity or atrophy Skin: warm and dry  Neuro:  Strength and sensation are intact Psych: euthymic mood, full affect + clubbing  EKG:  EKG is ordered today. The ekg ordered today is personally reviewed and shows sinus   Recent Labs: 11/08/2020: TSH 1.211 11/14/2020: ALT 131 11/17/2020: BUN 9; Creatinine, Ser 0.72; Hemoglobin 10.5; Platelets 641; Potassium 3.6; Sodium 135  personally reviewed   Lipid Panel     Component Value Date/Time   CHOL 223 (H)  12/21/2017 1540   TRIG 152 (H) 12/21/2017 1540   HDL 44 12/21/2017 1540   CHOLHDL 5.1 (H) 12/21/2017 1540   CHOLHDL 4.8 02/26/2015 0923   VLDL 42 (H) 02/26/2015 0923   LDLCALC 149 (H) 12/21/2017 1540   personally reviewed   Wt Readings from Last 3 Encounters:  11/25/20 171 lb 6.4 oz (77.7 kg)  11/08/20 182 lb 8.7 oz (82.8 kg)  11/04/20 182 lb (82.6 kg)      Other studies personally reviewed: Additional studies/ records that were reviewed today include: recent hospital records, my prior note, echo 2014  Review of the above records today demonstrates: as above   ASSESSMENT AND PLAN:  1.  SOB Less likely cardiac He has clubbing on exam I worry about rheumatologic cause.  He has been referred to Rheum.  He needs to establish with primary care for long term workup.  We will obtain an echo.  If low risk, no further CV testing is planned  2. PVCs resolved   He has no active EP issues  Follow-up:  general cardiology going forward    Signed, Thompson Grayer, MD  11/25/2020 10:26 AM     [facility] [address] [place]  [address] [phone] (office) [phone] (fax)

## 2020-11-25 NOTE — Patient Instructions (Addendum)
Medication Instructions:  Your physician recommends that you continue on your current medications as directed. Please refer to the Current Medication list given to you today.  Labwork: None ordered.  Testing/Procedures: Your physician has requested that you have an echocardiogram. Echocardiography is a painless test that uses sound waves to create images of your heart. It provides your doctor with information about the size and shape of your heart and how well your heart's chambers and valves are working. This procedure takes approximately one hour. There are no restrictions for this procedure.   Follow-Up: Your physician wants you to follow-up in: 2 months with Tereso Newcomer, PA   Any Other Special Instructions Will Be Listed Below (If Applicable).  If you need a refill on your cardiac medications before your next appointment, please call your pharmacy.

## 2020-11-26 ENCOUNTER — Inpatient Hospital Stay: Payer: BLUE CROSS/BLUE SHIELD | Admitting: Infectious Diseases

## 2020-11-28 ENCOUNTER — Other Ambulatory Visit: Payer: Self-pay

## 2020-11-28 ENCOUNTER — Ambulatory Visit (INDEPENDENT_AMBULATORY_CARE_PROVIDER_SITE_OTHER): Payer: BLUE CROSS/BLUE SHIELD | Admitting: Infectious Diseases

## 2020-11-28 VITALS — BP 119/78 | HR 97 | Temp 98.0°F | Resp 16 | Ht 65.0 in | Wt 169.0 lb

## 2020-11-28 DIAGNOSIS — M00261 Other streptococcal arthritis, right knee: Secondary | ICD-10-CM

## 2020-11-28 DIAGNOSIS — Z5181 Encounter for therapeutic drug level monitoring: Secondary | ICD-10-CM | POA: Diagnosis not present

## 2020-11-28 DIAGNOSIS — M2550 Pain in unspecified joint: Secondary | ICD-10-CM | POA: Diagnosis not present

## 2020-11-28 NOTE — Progress Notes (Addendum)
[facility]                                                             [place], [place], [place], [address]                                                                  Phn. 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[place]) - Primary     Other   Polyarthralgia   Medication monitoring encounter    # RT knee septic arthritis ( Strep agalactiae) - s/p RT knee aspiration on 8/18 with WBC 56,800, N 95%, no organims in gram stain and no growth in cultures   # Polyarthralgia with elevated RF and anti CCP antibodies concerning for Rheumatological process- Follow up with Rheumatology   # Medication Monitoring  11/25/20 ESR 66 and CRP 86 , wbc 7.7, hb 10.4, plts 532, cr 0.67  Plan Continue continuous IV penicillin for 2 more weeks from prior end date 12/06/20 for now as patient seems to be planned for an I and D procedure  Follow up with Orthopedics for planned I and D of RT knee. Would recommend to send sample for gram stain and routine aerobic and anaerobic cultures.   Will request labs from [facility] for review  Follow up with Rheumatology for evaluation of polyarthralgia   Follow up in 2 weeks and will plan on duration of abtx based on OR findings  All questions and concerns were discussed and addressed. Patient verbalized understanding of the plan. __________________________________________________________________________________________________________________ HPI/Interval Events  Here for HFU for rt knee septic arthritis. He is getting iv penicillin through his PICC line. Denies any pain/tenderness and  swelling at the picc line site, Denies any side effects with the abtx like nausea, vomiting, abdominal pain and diarrhea. He says the pain in the rt knee has significantly improved from the time when he was admitted in the hospital. He is able to walk which he was not able to walk before. He was seen at orthopedics [place]  on 8/18 where he had aspiration of rt knee due to swelling in the rt knee. Rt knee synovial fluid 8/18 with WBC 56,800' N 95%. He was then referred to see another surgeon in [place] yesterday for need of I and D of rt knee joint due to his insurance. He was told that he needed  I AND D of his rt knee joint but he wanted to talk to me before having the procedure. I discussed with him that I would definitely recommend to go ahead with the I and D as recommended by Ortho. Of note, his inflammatory markers have also elevated from prior values.  He says he has a follow up with Orthopedics tomorrow for planning for I and D. He also has a follow up with Rheumatology on August 29. I told him to keep those appointment. The left wrist pain is stable since discharge from the hospital.   ROS: Constitutional: Negative for fever, chills, activity change, appetite change, fatigue and unexpected weight change.  Respiratory: Negative for cough, shortness of breath Cardiovascular: Negative for chest pain, palpitations and leg swelling.  Gastrointestinal: Negative for nausea, vomiting, abdominal pain, diarrhea/constipation, .  Genitourinary: Negative for dysuria, hematuria, flank pain Musculoskeletal: Negative for myalgias, arthralgia, back pain Skin: Negative for rashes, lesions  Neurological: Negative for weakness, dizziness or headache  Past Medical History:  Diagnosis Date   Blood in stool    GERD (gastroesophageal reflux disease)    Premature ventricular contraction    outflow tract pvcs   Past Surgical History:  Procedure Laterality Date   none     Current Outpatient Medications  on File Prior to Visit  Medication Sig Dispense Refill   acetaminophen (TYLENOL) 500 MG tablet Take 1,000 mg by mouth every 6 (six) hours as needed for mild pain, moderate pain or headache.     amLODipine (NORVASC) 5 MG tablet Take 1 tablet (5 mg total) by mouth daily. 30 tablet 0   folic acid (FOLVITE) 1 MG tablet Take 1 tablet (1 mg total) by mouth daily. 30 tablet 2   indomethacin (INDOCIN) 25 MG capsule Take 1 capsule (25 mg total) by mouth 3 (three) times daily as needed. 30 capsule 0   pantoprazole (PROTONIX) 40 MG tablet Take 1 tablet (40 mg total) by mouth 2 (two) times daily. 60 tablet 0   penicillin G IVPB Inject 24 Million Units into the vein daily for 22 days. As a continuous infusion Indication:  Native joint septic arthritis First Dose: No Last Day of Therapy:  12/06/20 Labs - Once weekly:  CBC/D and BMP, Labs - Every other week:  ESR and CRP Method of administration: Elastomeric (Continuous infusion) Method of administration may be changed at the discretion of home infusion pharmacist based upon assessment of the patient and/or caregiver's ability to self-administer the medication ordered. 44 Units 0   vitamin B-12 1000 MCG tablet Take 1 tablet (1,000 mcg total) by mouth daily. 30 tablet 1   No current facility-administered medications on file prior to visit.   No Known Allergies  Social History   Socioeconomic History   Marital status: Married    Spouse name: Not on file   Number of children: 5   Years of education: Not on file   Highest education level: Not on file  Occupational History   Not on file  Tobacco Use   Smoking status: Never   Smokeless tobacco: Never  Vaping Use   Vaping Use: Never used  Substance and Sexual Activity   Alcohol use: No   Drug use: No   Sexual activity: Yes    Partners: Female  Other Topics Concern   Not on file  Social History Narrative   Pt is from [place], moved here in 1998.    Lives in [place] with spouse and  children.   Works  in Janitorial services   Social Determinants of Health   Financial Resource Strain: Not on file  Food Insecurity: Not on file  Transportation Needs: Not on file  Physical Activity: Not on file  Stress: Not on file  Social Connections: Not on file  Intimate Partner Violence: Not on file    Family History  Problem Relation Age of Onset   Heart attack Neg Hx     Vitals BP 119/78   Pulse 97   Temp 98 F (36.7 C)   Resp 16   Ht _0  (1.651 m)   Wt 169 lb (76.7 kg)   SpO2 98%   BMI 28.12 kg/m    Examination  General - not in acute distress, comfortably sitting in chair HEENT - PEERLA, no pallor and no icterus Chest - b/l clear air entry, no additional sounds CVS- Normal s1s2, RRR Abdomen - Soft, Non tender , non distended Ext- no pedal edema MSK- RT knee has mild swelling, no warmth, tenderness, has good ROM and no signs of septic joint. Left wrist with no warmth/swelling and tenderness/no signs of septic joint  Neuro: grossly normal Back - WNL Psych : calm and cooperative   Recent labs CBC Latest Ref Rng & Units 11/17/2020 11/14/2020 11/13/2020  WBC 4.0 - 10.5 K/uL 9.9 14.7(H) 16.5(H)  Hemoglobin 13.0 - 17.0 g/dL 10.5(L) 10.9(L) 11.8(L)  Hematocrit 39.0 - 52.0 % 32.9(L) 34.4(L) 37.2(L)  Platelets 150 - 400 K/uL 641(H) 535(H) 536(H)   CMP Latest Ref Rng & Units 11/17/2020 11/14/2020 11/13/2020  Glucose 70 - 99 mg/dL 98 106(H) 119(H)  BUN 6 - 20 mg/dL _1 Creatinine 0.61 - 1.24 mg/dL 0.72 0.80 0.89  Sodium 135 - 145 mmol/L 135 132(L) 133(L)  Potassium 3.5 - 5.1 mmol/L 3.6 4.1 4.0  Chloride 98 - 111 mmol/L 100 97(L) 100  CO2 22 - 32 mmol/L _2 Calcium 8.9 - 10.3 mg/dL 9.1 8.6(L) 8.8(L)  Total Protein 6.5 - 8.1 g/dL - 7.8 7.8  Total Bilirubin 0.3 - 1.2 mg/dL - 0.8 1.0  Alkaline Phos 38 - 126 U/L - 96 91  AST 15 - 41 U/L - 81(H) 48(H)  ALT 0 - 44 U/L - 131(H) 95(H)    Pertinent Microbiology Results for orders placed or performed  during the hospital encounter of 11/08/20  Body fluid culture w Gram Stain     Status: None   Collection Time: 11/08/20  2:04 PM   Specimen: Body Fluid  Result Value Ref Range Status   Specimen Description FLUID  Final   Special Requests SYNOVIAL RIGHT KNEE  Final   Gram Stain   Final    MODERATE WBC PRESENT, PREDOMINANTLY PMN NO ORGANISMS SEEN    Culture   Final    FEW STREPTOCOCCUS AGALACTIAE RESULT CALLED TO, READ BACK BY AND VERIFIED WITH: RN S.MEHAT ON [id number] AT 1245 BY E.PARRISH Performed at [facility], [place]. [address] [place],  [address]    Report Status 11/11/2020 FINAL  Final   Organism ID, Bacteria STREPTOCOCCUS AGALACTIAE  Final      Susceptibility   Streptococcus agalactiae - MIC*    CLINDAMYCIN RESISTANT Resistant     AMPICILLIN <=0.25 SENSITIVE Sensitive     ERYTHROMYCIN 2 RESISTANT Resistant     VANCOMYCIN 0.5 SENSITIVE Sensitive     CEFTRIAXONE <=0.12 SENSITIVE Sensitive     LEVOFLOXACIN >=16 RESISTANT Resistant     PENICILLIN Value in next row Sensitive  SENSITIVE<=0.06    * FEW STREPTOCOCCUS AGALACTIAE  Resp Panel by RT-PCR (Flu A&B, Covid) Nasopharyngeal Swab     Status: None   Collection Time: 11/08/20  4:00 PM   Specimen: Nasopharyngeal Swab; Nasopharyngeal(NP) swabs in vial transport medium  Result Value Ref Range Status   SARS Coronavirus 2 by RT PCR NEGATIVE NEGATIVE Final    Comment: (NOTE) SARS-CoV-2 target nucleic acids are NOT DETECTED.  The SARS-CoV-2 RNA is generally detectable in upper respiratory specimens during the acute phase of infection. The lowest concentration of SARS-CoV-2 viral copies this assay can detect is 138 copies/mL. A negative result does not preclude SARS-Cov-2 infection and should not be used as the sole basis for treatment or other patient management decisions. A negative result may occur with  improper specimen collection/handling, submission of specimen other than nasopharyngeal swab, presence of  viral mutation(s) within the areas targeted by this assay, and inadequate number of viral copies(<138 copies/mL). A negative result must be combined with clinical observations, patient history, and epidemiological information. The expected result is Negative.  Fact Sheet for Patients:  EntrepreneurPulse.com.au  Fact Sheet for Healthcare Providers:  IncredibleEmployment.be  This test is no t yet approved or cleared by the [place] FDA and  has been authorized for detection and/or diagnosis of SARS-CoV-2 by FDA under an Emergency Use Authorization (EUA). This EUA will remain  in effect (meaning this test can be used) for the duration of the COVID-19 declaration under Section 564(b)(1) of the Act, 21 U.S.C.section 360bbb-3(b)(1), unless the authorization is terminated  or revoked sooner.       Influenza A by PCR NEGATIVE NEGATIVE Final   Influenza B by PCR NEGATIVE NEGATIVE Final    Comment: (NOTE) The Xpert Xpress SARS-CoV-2/FLU/RSV plus assay is intended as an aid in the diagnosis of influenza from Nasopharyngeal swab specimens and should not be used as a sole basis for treatment. Nasal washings and aspirates are unacceptable for Xpert Xpress SARS-CoV-2/FLU/RSV testing.  Fact Sheet for Patients: EntrepreneurPulse.com.au  Fact Sheet for Healthcare Providers: IncredibleEmployment.be  This test is not yet approved or cleared by the [place] FDA and has been authorized for detection and/or diagnosis of SARS-CoV-2 by FDA under an Emergency Use Authorization (EUA). This EUA will remain in effect (meaning this test can be used) for the duration of the COVID-19 declaration under Section 564(b)(1) of the Act, 21 U.S.C. section 360bbb-3(b)(1), unless the authorization is terminated or revoked.  Performed at [facility], [place] [address], [place], [place] [address]   Culture, blood (routine x 2)      Status: None   Collection Time: 11/08/20  8:54 PM   Specimen: BLOOD  Result Value Ref Range Status   Specimen Description BLOOD LEFT ANTECUBITAL  Final   Special Requests   Final    BOTTLES DRAWN AEROBIC AND ANAEROBIC Blood Culture adequate volume   Culture   Final    NO GROWTH 5 DAYS Performed at [facility], [place] [address], [place], [place] [address]    Report Status 11/13/2020 FINAL  Final  Culture, blood (routine x 2)     Status: None   Collection Time: 11/08/20  8:59 PM   Specimen: BLOOD LEFT HAND  Result Value Ref Range Status   Specimen Description BLOOD LEFT HAND  Final   Special Requests   Final    BOTTLES DRAWN AEROBIC AND ANAEROBIC Blood Culture results may not be optimal due to an inadequate volume of blood received in  culture bottles   Culture   Final    NO GROWTH 5 DAYS Performed at [facility], [place] [address], [place], [place] [address]    Report Status 11/13/2020 FINAL  Final     Pertinent Imaging All pertinent labs/Imagings/notes reviewed. All pertinent plain films and CT images have been personally visualized and interpreted; radiology reports have been reviewed. Decision making incorporated into the Impression / Recommendations.  I have spent a total of 60 minutes of face-to-face and non-face-to-face time, excluding clinical staff time, preparing to see patient, ordering tests and/or medications, and provide counseling the patient    Electronically signed by:  Rosiland Oz, MD Infectious Disease Physician [facility] [address] [place], [place] [address] Phone: [phone]  Fax: [phone]

## 2020-12-10 ENCOUNTER — Ambulatory Visit (HOSPITAL_COMMUNITY): Payer: BLUE CROSS/BLUE SHIELD | Attending: Internal Medicine

## 2020-12-10 ENCOUNTER — Other Ambulatory Visit: Payer: Self-pay

## 2020-12-10 DIAGNOSIS — I493 Ventricular premature depolarization: Secondary | ICD-10-CM | POA: Insufficient documentation

## 2020-12-10 DIAGNOSIS — R0600 Dyspnea, unspecified: Secondary | ICD-10-CM

## 2020-12-10 DIAGNOSIS — R002 Palpitations: Secondary | ICD-10-CM | POA: Diagnosis present

## 2020-12-10 LAB — ECHOCARDIOGRAM COMPLETE
Area-P 1/2: 2.68 cm2
S' Lateral: 3.4 cm

## 2020-12-16 ENCOUNTER — Ambulatory Visit (INDEPENDENT_AMBULATORY_CARE_PROVIDER_SITE_OTHER): Payer: BLUE CROSS/BLUE SHIELD | Admitting: Infectious Diseases

## 2020-12-16 ENCOUNTER — Encounter: Payer: Self-pay | Admitting: Infectious Diseases

## 2020-12-16 ENCOUNTER — Telehealth: Payer: Self-pay | Admitting: *Deleted

## 2020-12-16 ENCOUNTER — Other Ambulatory Visit: Payer: Self-pay

## 2020-12-16 VITALS — BP 119/75 | HR 3 | Temp 97.9°F | Wt 174.0 lb

## 2020-12-16 DIAGNOSIS — Z5181 Encounter for therapeutic drug level monitoring: Secondary | ICD-10-CM | POA: Diagnosis not present

## 2020-12-16 DIAGNOSIS — M00261 Other streptococcal arthritis, right knee: Secondary | ICD-10-CM | POA: Diagnosis not present

## 2020-12-16 NOTE — Progress Notes (Signed)
[facility]                                                             [address], [place], [place], [address]                                                                  Phn. [phone]; Fax: [phone]                                                                             Date: 12/16/20  Reason for Follow Up: Rt knee septic arthritis  # ? RT knee septic arthritis ( Strep agalactiae) - s/p RT knee aspiration on 8/18 with WBC 56,800, N 95%, no organims in gram stain and no growth in cultures  - S/p Right knee arthroscopy with avage and drainage for infection and Left wrist open arthrotomy on 12/03/20. OR cultures no growth to date  # Seropositive RA- On Prednisone, plan to start on Methotrexate, follows with Rheumatology   # Medication Monitoring  CMP 9/6 and CBC 9/7 reviewed from careeverywhere  Plan DC PICC line and IV penicillin. Clinical Presentation is somewhat sceptical for true septic arthritis of rt knee given OR findings and diagnosis of RA. It is possible his synovial fluid cultures 8/5 could have been contaminated. However, patient has completed approx 6 weeks f IV penicillin by now.   RN to coordinate with [facility] for PICC line removal  Fu with Rheumatology as instructed  Fu with me PRN  All questions and concerns were discussed and addressed. Patient verbalized understanding of the plan. __________________________________________________________________________________________________________________ HPI/Interval Events  Here for FU in the setting of presumed Streptococcal arthritis and new diagnosis of RA.  After his last HFU visit on 8/25, he underwent Right knee arthroscopy with lavage and drainage and Left wrist open arthrotomy. OR findings was unremarkable for bacterial infection or septic arthritis with no purulence. 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 OR cultures 8/30 from rt knee and left wrist with no growth to date. He was also seen by Rheumatologist with a diagnosis of seropositive RA. 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 He was also referred to Ophthalmology.  Patient was started on Prednisone which he has been taking currently  with resolution of prior reported symptoms of left wrist pain and rt knee pain. There is a plan for starting Methotrexate as well   He is still getting IV penicillin through his RT arm PICC line. Denies any issues with PICC line or concerns with the abtx. He says pain in the left wrist and rt  knee has been completely resolved. Discussed with him about discontinuing PICC line and IV abtx today - agrees to plan.   ROS: Constitutional: Negative for fever, chills, activity change, appetite change, fatigue and unexpected weight change.  Respiratory: Negative for cough, shortness of breath Cardiovascular: Negative for chest pain, palpitations and leg swelling.  Gastrointestinal: Negative for nausea, vomiting, abdominal pain, diarrhea/constipation, .  Genitourinary: Negative for dysuria, hematuria, flank pain Musculoskeletal: Negative for myalgias, arthralgia, back pain Skin: Negative for rashes, lesions  Neurological: Negative for weakness, dizziness or headache  Past Medical History:  Diagnosis Date   Blood in stool    GERD (gastroesophageal reflux disease)    Premature ventricular contraction    outflow tract pvcs   Past Surgical History:  Procedure Laterality Date   none     Current Outpatient Medications on File Prior to Visit  Medication Sig Dispense Refill   acetaminophen (TYLENOL) 500 MG tablet Take 1,000 mg by mouth every 6 (six) hours as needed for mild pain, moderate pain or headache.     amLODipine (NORVASC) 5 MG tablet Take 1 tablet  (5 mg total) by mouth daily. 30 tablet 0   folic acid (FOLVITE) 1 MG tablet Take 1 tablet (1 mg total) by mouth daily. 30 tablet 2   loteprednol (LOTEMAX) 0.5 % ophthalmic suspension SMARTSIG:In Eye(s)     methotrexate (RHEUMATREX) 2.5 MG tablet Take by mouth. 6 tablets once a week (Saturday)     predniSONE (DELTASONE) 20 MG tablet Take 30 mg by mouth daily with breakfast. Take until complete. Then take 20 mg daily for 5 days, then 15 mg for 5 days, then 10 mg for 3 days, then stop prednisone. Start methotrexate weekly as directed.     vitamin B-12 1000 MCG tablet Take 1 tablet (1,000 mcg total) by mouth daily. 30 tablet 1   No current facility-administered medications on file prior to visit.     Allergies  Allergen Reactions   Fish Allergy Itching    Social History   Socioeconomic History   Marital status: Married    Spouse name: Not on file   Number of children: 5   Years of education: Not on file   Highest education level: Not on file  Occupational History   Not on file  Tobacco Use   Smoking status: Never   Smokeless tobacco: Never  Vaping Use   Vaping Use: Never used  Substance and Sexual Activity   Alcohol use: No   Drug use: No   Sexual activity: Yes    Partners: Female  Other Topics Concern   Not on file  Social History Narrative   Pt is from [place], moved here in 1998.    Lives in [place] with spouse and children.   Works in [employer]   Social Determinants of Corporate investment banker Strain: Not on file  Food Insecurity: Not on file  Transportation Needs: Not on file  Physical Activity: Not on file  Stress: Not on file  Social Connections: Not on file  Intimate Partner Violence: Not on file    Family History  Problem Relation Age of Onset   Heart attack Neg Hx     Vitals Wt 174 lb (78.9 kg)   BMI 28.96 kg/m    Examination  General - not in acute distress, comfortably sitting in chair HEENT - PEERLA, no pallor and no  icterus Chest - b/l clear air entry, no additional sounds CVS- Normal s1s2, RRR Abdomen -  Soft, Non tender , non distended Ext- no pedal edema MSK- RT knee and left knee with no signs of redness/swelling/tenderness with good ROM. RT arm PICC line site with no pain.tenderness and swelling  Neuro: grossly normal Back - WNL Psych : calm and cooperative   Recent labs CBC Latest Ref Rng & Units 11/17/2020 11/14/2020 11/13/2020  WBC 4.0 - 10.5 K/uL 9.9 14.7(H) 16.5(H)  Hemoglobin 13.0 - 17.0 g/dL 10.5(L) 10.9(L) 11.8(L)  Hematocrit 39.0 - 52.0 % 32.9(L) 34.4(L) 37.2(L)  Platelets 150 - 400 K/uL 641(H) 535(H) 536(H)   CMP Latest Ref Rng & Units 11/17/2020 11/14/2020 11/13/2020  Glucose 70 - 99 mg/dL 98 086(P) 619(J)  BUN 6 - 20 mg/dL 9 10 13   Creatinine 0.61 - 1.24 mg/dL 0.93 2.67  Sodium 135 - 145 mmol/L 135 132(L) 133(L)  Potassium 3.5 - 5.1 mmol/L 3.6 4.1 4.0  Chloride 98 - 111 mmol/L 100 97(L) 100  CO2 22 - 32 mmol/L 26 26 25   Calcium 8.9 - 10.3 mg/dL 9.1 1.24) )  Total Protein 6.5 - 8.1 g/dL - 7.8 7.8  Total Bilirubin 0.3 - 1.2 mg/dL - 0.8 1.0  Alkaline Phos 38 - 126 U/L - 96 91  AST 15 - 41 U/L - 81(H) 48(H)  ALT 0 - 44 U/L - 131(H) 95(H)    Pertinent Microbiology Results for orders placed or performed during the hospital encounter of 11/08/20  Body fluid culture w Gram Stain     Status: None   Collection Time: 11/08/20  2:04 PM   Specimen: Body Fluid  Result Value Ref Range Status   Specimen Description FLUID  Final   Special Requests SYNOVIAL RIGHT KNEE  Final   Gram Stain   Final    MODERATE WBC PRESENT, PREDOMINANTLY PMN NO ORGANISMS SEEN    Culture   Final    FEW STREPTOCOCCUS AGALACTIAE RESULT CALLED TO, READ BACK BY AND VERIFIED WITH: RN S.MEHAT ON 01/08/21 AT 1245 BY E.PARRISH Performed at [facility], [address] [place], [address] [place]    Report Status 11/11/2020 FINAL  Final   Organism ID, Bacteria STREPTOCOCCUS AGALACTIAE  Final       Susceptibility   Streptococcus agalactiae - MIC*    CLINDAMYCIN RESISTANT Resistant     AMPICILLIN <=0.25 SENSITIVE Sensitive     ERYTHROMYCIN 2 RESISTANT Resistant     VANCOMYCIN 0.5 SENSITIVE Sensitive     CEFTRIAXONE <=0.12 SENSITIVE Sensitive     LEVOFLOXACIN >=16 RESISTANT Resistant     PENICILLIN Value in next row Sensitive      SENSITIVE<=0.06    * FEW STREPTOCOCCUS AGALACTIAE  Resp Panel by RT-PCR (Flu A&B, Covid) Nasopharyngeal Swab     Status: None   Collection Time: 11/08/20  4:00 PM   Specimen: Nasopharyngeal Swab; Nasopharyngeal(NP) swabs in vial transport medium  Result Value Ref Range Status   SARS Coronavirus 2 by RT PCR NEGATIVE NEGATIVE Final    Comment: (NOTE) SARS-CoV-2 target nucleic acids are NOT DETECTED.  The SARS-CoV-2 RNA is generally detectable in upper respiratory specimens during the acute phase of infection. The lowest concentration of SARS-CoV-2 viral copies this assay can detect is 138 copies/mL. A negative result does not preclude SARS-Cov-2 infection and should not be used as the sole basis for treatment or other patient management decisions. A negative result may occur with  improper specimen collection/handling, submission of specimen other than nasopharyngeal swab, presence of viral mutation(s) within the areas targeted by this assay, and  inadequate number of viral copies(<138 copies/mL). A negative result must be combined with clinical observations, patient history, and epidemiological information. The expected result is Negative.  Fact Sheet for Patients:  BloggerCourse.com  Fact Sheet for Healthcare Providers:  SeriousBroker.it  This test is no t yet approved or cleared by the [place] FDA and  has been authorized for detection and/or diagnosis of SARS-CoV-2 by FDA under an Emergency Use Authorization (EUA). This EUA will remain  in effect (meaning this test can be used) for the  duration of the COVID-19 declaration under Section 564(b)(1) of the Act, 21 U.S.C.section 360bbb-3(b)(1), unless the authorization is terminated  or revoked sooner.       Influenza A by PCR NEGATIVE NEGATIVE Final   Influenza B by PCR NEGATIVE NEGATIVE Final    Comment: (NOTE) The Xpert Xpress SARS-CoV-2/FLU/RSV plus assay is intended as an aid in the diagnosis of influenza from Nasopharyngeal swab specimens and should not be used as a sole basis for treatment. Nasal washings and aspirates are unacceptable for Xpert Xpress SARS-CoV-2/FLU/RSV testing.  Fact Sheet for Patients: BloggerCourse.com  Fact Sheet for Healthcare Providers: SeriousBroker.it  This test is not yet approved or cleared by the [place] FDA and has been authorized for detection and/or diagnosis of SARS-CoV-2 by FDA under an Emergency Use Authorization (EUA). This EUA will remain in effect (meaning this test can be used) for the duration of the COVID-19 declaration under Section 564(b)(1) of the Act, 21 U.S.C. section 360bbb-3(b)(1), unless the authorization is terminated or revoked.  Performed at [facility], [address], [place], [place] [address]   Culture, blood (routine x 2)     Status: None   Collection Time: 11/08/20  8:54 PM   Specimen: BLOOD  Result Value Ref Range Status   Specimen Description BLOOD LEFT ANTECUBITAL  Final   Special Requests   Final    BOTTLES DRAWN AEROBIC AND ANAEROBIC Blood Culture adequate volume   Culture   Final    NO GROWTH 5 DAYS Performed at [facility], [address] [place], [place] [address]    Report Status 11/13/2020 FINAL  Final  Culture, blood (routine x 2)     Status: None   Collection Time: 11/08/20  8:59 PM   Specimen: BLOOD LEFT HAND  Result Value Ref Range Status   Specimen Description BLOOD LEFT HAND  Final   Special Requests   Final    BOTTLES DRAWN AEROBIC AND ANAEROBIC Blood  Culture results may not be optimal due to an inadequate volume of blood received in culture bottles   Culture   Final    NO GROWTH 5 DAYS Performed at [facility], [address], [place], [place] [address]    Report Status 11/13/2020 FINAL  Final     Pertinent Imaging All pertinent labs/Imagings/notes reviewed. All pertinent plain films and CT images have been personally visualized and interpreted; radiology reports have been reviewed. Decision making incorporated into the Impression / Recommendations.  I have spent a total of 30  minutes of face-to-face and non-face-to-face time, excluding clinical staff time, preparing to see patient, ordering tests and/or medications, and provide counseling the patient    Electronically signed by:  Odette Fraction, MD Infectious Disease Physician [facility] [address] [place], [place] [address] Phone: [phone]  Fax: [phone]

## 2020-12-16 NOTE — Telephone Encounter (Signed)
Per Dr Elinor Parkinson, RN relayed verbal order to stop penicillin and pull PICC effective today. RN relayed PICC length, as it was placed at [facility] facility (38 cm R brachial). Order repeated and verified. Andree Coss, RN

## 2021-01-28 DIAGNOSIS — R0600 Dyspnea, unspecified: Secondary | ICD-10-CM | POA: Insufficient documentation

## 2021-01-28 DIAGNOSIS — R0602 Shortness of breath: Secondary | ICD-10-CM | POA: Insufficient documentation

## 2021-01-28 NOTE — Progress Notes (Signed)
Cardiology Office Note:    Date:  01/29/2021   ID:  Juan Tanner, DOB May 25, 1965, MRN [id number]  PCP:  Pcp, No   [facility] Providers Cardiologist:  None Cardiology APP:  Kennon Rounds     Referring MD: No ref. provider found   Chief Complaint:  F/u after echocardiogram    Patient Profile:   Juan Tanner is a 55 y.o. male with:  PVCs Seen by EP in the past  Last OV in 8/22: "resolved" Rheumatoid arthritis  Hx of ? R knee septic arthritis  GERD Hypertension   History of Present Illness: Juan Tanner was recently seen by Dr. Johney Frame for shortness of breath.  An echocardiogram demonstrated normal EF.  He returns for f/u.  He is here alone.  He is no longer taking Amlodipine.  His BP has been normal.  He is seeing Rheumatology and is on methotrexate for RA.  He has not had chest pain, shortness of breath, syncope, leg edema.  We reviewed his echocardiogram results.  He is a Naval architect.  He is fairly active without chest pain or shortness of breath.   ASSESSMENT & PLAN:   SOB (shortness of breath) He denies significant shortness of breath with activity.  Recent echocardiogram with normal EF and no significant valve disease.  No further testing is needed at this time.  He can f/u with Cardiology as needed.   Essential hypertension He was on Amlodipine but has been off of this for 2 mos.  His BP has been well controlled without medication.  I have recommended that he establish with primary care.   Premature ventricular contractions Recently seen by EP.  PVCs have resolved.    Dispo:  Return for follow up as needed.    Prior CV studies: Echocardiogram 12/10/20 EF 60-65, no RWMA, normal RVSF, RVSP 18.7, trivial to mild MR  ETT 06/24/12 Normal     Past Medical History:  Diagnosis Date   Blood in stool    GERD (gastroesophageal reflux disease)    Premature ventricular contraction    outflow tract pvcs   Current Medications: Current Meds  Medication Sig    acetaminophen (TYLENOL) 500 MG tablet Take 1,000 mg by mouth every 6 (six) hours as needed for mild pain, moderate pain or headache.   esomeprazole (NEXIUM) 20 MG capsule Take 20 mg by mouth 2 (two) times daily before a meal.   folic acid (FOLVITE) 1 MG tablet Take 1 tablet (1 mg total) by mouth daily.   loteprednol (LOTEMAX) 0.5 % ophthalmic suspension SMARTSIG:In Eye(s)   methotrexate (RHEUMATREX) 2.5 MG tablet Take by mouth. 6 tablets once a week (Saturday)   vitamin B-12 1000 MCG tablet Take 1 tablet (1,000 mcg total) by mouth daily.    Allergies:   Fish allergy   Social History   Tobacco Use   Smoking status: Never   Smokeless tobacco: Never  Vaping Use   Vaping Use: Never used  Substance Use Topics   Alcohol use: No   Drug use: No    Family Hx: The patient's family history is negative for Heart attack.  ROS see HPI  EKGs/Labs/Other Test Reviewed:    EKG:  EKG is not ordered today.  The ekg ordered today demonstrates n/a  Recent Labs: 11/08/2020: TSH 1.211 11/14/2020: ALT 131 11/17/2020: BUN 9; Creatinine, Ser 0.72; Hemoglobin 10.5; Platelets 641; Potassium 3.6; Sodium 135   Recent Lipid Panel Lab Results  Component Value Date/Time   CHOL 223 (H) 12/21/2017  03:40 PM   TRIG 152 (H) 12/21/2017 03:40 PM   HDL 44 12/21/2017 03:40 PM   LDLCALC 149 (H) 12/21/2017 03:40 PM     Risk Assessment/Calculations:          Physical Exam:    VS:  BP 110/70   Pulse 62   Ht 5\' 5"  (1.651 m)   Wt 180 lb 3.2 oz (81.7 kg)   SpO2 97%   BMI 29.99 kg/m     Wt Readings from Last 3 Encounters:  01/29/21 180 lb 3.2 oz (81.7 kg)  12/16/20 174 lb (78.9 kg)  11/28/20 169 lb (76.7 kg)    Constitutional:      Appearance: Healthy appearance. Not in distress.  Pulmonary:     Breath sounds: No wheezing. No rales.  Cardiovascular:     Normal rate. Regular rhythm. Normal S1. Normal S2.      Murmurs: There is no murmur.  Edema:    Peripheral edema absent.  Abdominal:      Palpations: Abdomen is soft.  Musculoskeletal:     Cervical back: Neck supple. Skin:    General: Skin is warm and dry.  Neurological:     General: No focal deficit present.     Mental Status: Alert and oriented to person, place and time.        Medication Adjustments/Labs and Tests Ordered: Current medicines are reviewed at length with the patient today.  Concerns regarding medicines are outlined above.  Tests Ordered: No orders of the defined types were placed in this encounter.  Medication Changes: No orders of the defined types were placed in this encounter.  Signed, 11/30/20, PA-C  01/29/2021 10:25 AM    [facility] [address] [place], [place], [place]  [place] Phone: [phone]; Fax: [phone]

## 2021-01-29 ENCOUNTER — Other Ambulatory Visit: Payer: Self-pay

## 2021-01-29 ENCOUNTER — Ambulatory Visit (INDEPENDENT_AMBULATORY_CARE_PROVIDER_SITE_OTHER): Payer: BLUE CROSS/BLUE SHIELD | Admitting: Physician Assistant

## 2021-01-29 ENCOUNTER — Encounter: Payer: Self-pay | Admitting: Physician Assistant

## 2021-01-29 VITALS — BP 110/70 | HR 62 | Ht 65.0 in | Wt 180.2 lb

## 2021-01-29 DIAGNOSIS — I493 Ventricular premature depolarization: Secondary | ICD-10-CM | POA: Diagnosis not present

## 2021-01-29 DIAGNOSIS — I10 Essential (primary) hypertension: Secondary | ICD-10-CM

## 2021-01-29 DIAGNOSIS — R0602 Shortness of breath: Secondary | ICD-10-CM

## 2021-01-29 NOTE — Assessment & Plan Note (Signed)
Recently seen by EP.  PVCs have resolved.

## 2021-01-29 NOTE — Patient Instructions (Signed)
Medication Instructions:   Your physician recommends that you continue on your current medications as directed. Please refer to the Current Medication list given to you today.  *If you need a refill on your cardiac medications before your next appointment, please call your pharmacy*   Lab Work:  -NONE  If you have labs (blood work) drawn today and your tests are completely normal, you will receive your results only by: MyChart Message (if you have MyChart) OR A paper copy in the mail If you have any lab test that is abnormal or we need to change your treatment, we will call you to review the results.   Testing/Procedures:  -NONE   Follow-Up: At [facility], you and your health needs are our priority.  As part of our continuing mission to provide you with exceptional heart care, we have created designated Provider Care Teams.  These Care Teams include your primary Cardiologist (physician) and Advanced Practice Providers (APPs -  Physician Assistants and Nurse Practitioners) who all work together to provide you with the care you need, when you need it.  We recommend signing up for the patient portal called "MyChart".  Sign up information is provided on this After Visit Summary.  MyChart is used to connect with patients for Virtual Visits (Telemedicine).  Patients are able to view lab/test results, encounter notes, upcoming appointments, etc.  Non-urgent messages can be sent to your provider as well.   To learn more about what you can do with MyChart, go to [email or web address].    Your next appointment:   As needed  Please get a Primary Care Doctor.

## 2021-01-29 NOTE — Assessment & Plan Note (Signed)
He was on Amlodipine but has been off of this for 2 mos.  His BP has been well controlled without medication.  I have recommended that he establish with primary care.

## 2021-01-29 NOTE — Assessment & Plan Note (Signed)
He denies significant shortness of breath with activity.  Recent echocardiogram with normal EF and no significant valve disease.  No further testing is needed at this time.  He can f/u with Cardiology as needed.

## 2021-09-26 ENCOUNTER — Emergency Department (HOSPITAL_COMMUNITY)
Admission: EM | Admit: 2021-09-26 | Discharge: 2021-09-26 | Disposition: A | Discharge status: Home / Self Care | Payer: BLUE CROSS/BLUE SHIELD | Attending: Emergency Medicine | Admitting: Emergency Medicine

## 2021-09-26 ENCOUNTER — Encounter (HOSPITAL_COMMUNITY): Payer: Self-pay

## 2021-09-26 ENCOUNTER — Emergency Department (HOSPITAL_COMMUNITY): Payer: BLUE CROSS/BLUE SHIELD

## 2021-09-26 ENCOUNTER — Other Ambulatory Visit: Payer: Self-pay

## 2021-09-26 DIAGNOSIS — R1013 Epigastric pain: Secondary | ICD-10-CM | POA: Diagnosis not present

## 2021-09-26 DIAGNOSIS — R0789 Other chest pain: Secondary | ICD-10-CM | POA: Diagnosis present

## 2021-09-26 LAB — CBC
HCT: 44 % (ref 39.0–52.0)
Hemoglobin: 14.1 g/dL (ref 13.0–17.0)
MCH: 27.9 pg (ref 26.0–34.0)
MCHC: 32 g/dL (ref 30.0–36.0)
MCV: 87 fL (ref 80.0–100.0)
Platelets: 277 10*3/uL (ref 150–400)
RBC: 5.06 MIL/uL (ref 4.22–5.81)
RDW: 13.6 % (ref 11.5–15.5)
WBC: 7.7 10*3/uL (ref 4.0–10.5)
nRBC: 0 % (ref 0.0–0.2)

## 2021-09-26 LAB — BASIC METABOLIC PANEL
Anion gap: 7 (ref 5–15)
BUN: 14 mg/dL (ref 6–20)
CO2: 25 mmol/L (ref 22–32)
Calcium: 9.1 mg/dL (ref 8.9–10.3)
Chloride: 109 mmol/L (ref 98–111)
Creatinine, Ser: 0.9 mg/dL (ref 0.61–1.24)
GFR, Estimated: >60 mL/min (ref >60)
Glucose, Bld: 87 mg/dL (ref 70–99)
Potassium: 4.2 mmol/L (ref 3.5–5.1)
Sodium: 141 mmol/L (ref 135–145)

## 2021-09-26 LAB — TROPONIN I (HIGH SENSITIVITY): Troponin I (High Sensitivity): 3 ng/L (ref <18)

## 2021-09-26 MED ORDER — ALUM & MAG HYDROXIDE-SIMETH 200-200-20 MG/5ML PO SUSP
30.0000 mL | Freq: Once | ORAL | Status: AC
Start: 2021-09-26 — End: 2021-09-26
  Administered 2021-09-26: 30 mL via ORAL
  Filled 2021-09-26: qty 30

## 2021-09-26 MED ORDER — SUCRALFATE 1 G PO TABS: 1.0000 g | ORAL_TABLET | Freq: Three times a day (TID) | ORAL | 0 refills | Status: DC

## 2021-09-26 MED ORDER — FAMOTIDINE 20 MG PO TABS: 20.0000 mg | ORAL_TABLET | Freq: Two times a day (BID) | ORAL | 0 refills | Status: DC

## 2021-09-26 MED ORDER — LIDOCAINE VISCOUS HCL 2 % MT SOLN
15.0000 mL | Freq: Once | OROMUCOSAL | Status: AC
Start: 2021-09-26 — End: 2021-09-26
  Administered 2021-09-26: 15 mL via ORAL
  Filled 2021-09-26: qty 15

## 2021-09-26 NOTE — ED Triage Notes (Signed)
Pt presents to ED from home with c/o chest pain that began x 3 days ago. Pt endorses left sided chest pain that radiates to back between L and R shoulders. Pt endorses some SOB after eating, weakness and fatigue.

## 2022-10-26 ENCOUNTER — Ambulatory Visit (HOSPITAL_COMMUNITY)
Admission: EM | Admit: 2022-10-26 | Discharge: 2022-10-26 | Disposition: A | Discharge status: Home / Self Care | Payer: BLUE CROSS/BLUE SHIELD | Attending: Family Medicine | Admitting: Family Medicine

## 2022-10-26 ENCOUNTER — Encounter (HOSPITAL_COMMUNITY): Payer: Self-pay

## 2022-10-26 DIAGNOSIS — J019 Acute sinusitis, unspecified: Secondary | ICD-10-CM | POA: Diagnosis not present

## 2022-10-26 DIAGNOSIS — J069 Acute upper respiratory infection, unspecified: Secondary | ICD-10-CM

## 2022-10-26 MED ORDER — BENZONATATE 100 MG PO CAPS: 100.0000 mg | ORAL_CAPSULE | Freq: Three times a day (TID) | ORAL | 0 refills | Status: DC | PRN

## 2022-10-26 MED ORDER — DOXYCYCLINE HYCLATE 100 MG PO CAPS: 100.0000 mg | ORAL_CAPSULE | Freq: Two times a day (BID) | ORAL | 0 refills | Status: AC

## 2022-10-26 NOTE — ED Provider Notes (Addendum)
[facility]    CSN: [id number] Arrival date & time: 10/26/22  0932      History   Chief Complaint Chief Complaint  Patient presents with   Cough   Sore Throat    HPI Juan Tanner is a 57 y.o. male.    Cough Sore Throat  Here for sore throat and cough and congestion.  He has had some chest pain in the central anterior chest when he coughs.  Symptoms began about 10 days ago.  First he had sore throat and then it developed into congestion and cough.  The cough developed about 6 or 7 days ago.  He only has chest pain when he coughs.  No wheezing or shortness of breath, and no fever except at the very beginning.  No known exposures.  He did have an EGD about 3 days before the symptoms started.   No allergies to medications   He does take methotrexate and folic acid regularly for rheumatoid arthritis  Past Medical History:  Diagnosis Date   Blood in stool    GERD (gastroesophageal reflux disease)    Premature ventricular contraction    outflow tract pvcs    Patient Active Problem List   Diagnosis Date Noted   Essential hypertension 01/29/2021   SOB (shortness of breath) 01/28/2021   Polyarthralgia 11/28/2020   Streptococcal arthritis of right knee (HCC) 11/28/2020   Medication monitoring encounter 11/28/2020   Cellulitis of right lower extremity 11/09/2020   Cellulitis of right lower leg 11/08/2020   Effusion of right knee 11/08/2020   Joint pain 11/08/2020   Transaminitis 11/08/2020   Leukocytosis 11/08/2020   Acute gout of left wrist 11/04/2020   Acute pain of left knee 11/04/2020   Premature ventricular contractions 06/06/2012   GERD 02/16/2008    Past Surgical History:  Procedure Laterality Date   none         Home Medications    Prior to Admission medications   Medication Sig Start Date End Date Taking? Authorizing Provider  benzonatate (TESSALON) 100 MG capsule Take 1 capsule (100 mg total) by mouth 3 (three) times daily as needed  for cough. 10/26/22  Yes Zenia Resides, MD  doxycycline (VIBRAMYCIN) 100 MG capsule Take 1 capsule (100 mg total) by mouth 2 (two) times daily for 7 days. 10/26/22 11/02/22 Yes Zenia Resides, MD  esomeprazole (NEXIUM) 20 MG capsule Take 20 mg by mouth 2 (two) times daily before a meal.    [provider]  loteprednol (LOTEMAX) 0.5 % ophthalmic suspension SMARTSIG:In Eye(s) 12/10/20   [provider]  vitamin B-12 1000 MCG tablet Take 1 tablet (1,000 mcg total) by mouth daily. 11/18/20   Regalado, Prentiss Bells, MD    Family History Family History  Problem Relation Age of Onset   Heart attack Neg Hx     Social History Social History   Tobacco Use   Smoking status: Never   Smokeless tobacco: Never  Vaping Use   Vaping status: Never Used  Substance Use Topics   Alcohol use: No   Drug use: No     Allergies   Fish allergy   Review of Systems Review of Systems  Respiratory:  Positive for cough.      Physical Exam Triage Vital Signs ED Triage Vitals [10/26/22 1005]  Encounter Vitals Group     BP 134/81     Systolic BP Percentile      Diastolic BP Percentile      Pulse Rate  78     Resp 16     Temp 98.1 F (36.7 C)     Temp Source Oral     SpO2 93 %     Weight      Height      Head Circumference      Peak Flow      Pain Score      Pain Loc      Pain Education      Exclude from Growth Chart    No data found.  Updated Vital Signs BP 134/81 (BP Location: Left Arm)   Pulse 78   Temp 98.1 F (36.7 C) (Oral)   Resp 16   SpO2 93%   Visual Acuity Right Eye Distance:   Left Eye Distance:   Bilateral Distance:    Right Eye Near:   Left Eye Near:    Bilateral Near:     Physical Exam   UC Treatments / Results  Labs (all labs ordered are listed, but only abnormal results are displayed) Labs Reviewed - No data to display  EKG   Radiology No results found.  Procedures Procedures (including critical care time)  Medications Ordered  in UC Medications - No data to display  Initial Impression / Assessment and Plan / UC Course  I have reviewed the triage vital signs and the nursing notes.  Pertinent labs & imaging results that were available during my care of the patient were reviewed by me and considered in my medical decision making (see chart for details).        Doxycycline is sent in for acute sinusitis, due to the length of symptoms.  Tessalon Perles are sent in for cough.  His lung exam is clear and reassuring. Final Clinical Impressions(s) / UC Diagnoses   Final diagnoses:  Acute sinusitis, recurrence not specified, unspecified location  Viral upper respiratory tract infection     Discharge Instructions      Take doxycycline 100 mg --1 capsule 2 times daily for 7 days  Take benzonatate 100 mg, 1 tab every 8 hours as needed for cough.       ED Prescriptions     Medication Sig Dispense Auth. Provider   doxycycline (VIBRAMYCIN) 100 MG capsule Take 1 capsule (100 mg total) by mouth 2 (two) times daily for 7 days. 14 capsule Meng Winterton, Janace Aris, MD   benzonatate (TESSALON) 100 MG capsule Take 1 capsule (100 mg total) by mouth 3 (three) times daily as needed for cough. 21 capsule Zenia Resides, MD      PDMP not reviewed this encounter.   Zenia Resides, MD 10/26/22 1047    Zenia Resides, MD 10/26/22 1047    Zenia Resides, MD 10/26/22 1048

## 2022-10-26 NOTE — ED Triage Notes (Signed)
Here for sore throat and cough x 1 week. Pt stated he is coughing up phlegm.

## 2022-10-26 NOTE — Discharge Instructions (Signed)
Take doxycycline 100 mg --1 capsule 2 times daily for 7 days  Take benzonatate 100 mg, 1 tab every 8 hours as needed for cough.

## 2022-11-10 ENCOUNTER — Encounter: Payer: Self-pay | Admitting: Nurse Practitioner

## 2022-11-10 ENCOUNTER — Ambulatory Visit: Payer: BLUE CROSS/BLUE SHIELD | Attending: Nurse Practitioner | Admitting: Nurse Practitioner

## 2022-11-10 VITALS — BP 124/80 | HR 66 | Ht 65.0 in | Wt 195.8 lb

## 2022-11-10 DIAGNOSIS — R030 Elevated blood-pressure reading, without diagnosis of hypertension: Secondary | ICD-10-CM | POA: Diagnosis not present

## 2022-11-10 DIAGNOSIS — E782 Mixed hyperlipidemia: Secondary | ICD-10-CM | POA: Diagnosis not present

## 2022-11-10 DIAGNOSIS — R0609 Other forms of dyspnea: Secondary | ICD-10-CM | POA: Diagnosis not present

## 2022-11-10 DIAGNOSIS — I493 Ventricular premature depolarization: Secondary | ICD-10-CM

## 2022-11-10 DIAGNOSIS — M069 Rheumatoid arthritis, unspecified: Secondary | ICD-10-CM

## 2022-11-10 MED ORDER — ROSUVASTATIN CALCIUM 20 MG PO TABS: 20.0000 mg | ORAL_TABLET | Freq: Every day | ORAL | 3 refills | Status: DC

## 2022-11-10 MED ORDER — METOPROLOL TARTRATE 100 MG PO TABS
ORAL_TABLET | ORAL | 0 refills | Status: DC
Start: 2022-11-10 — End: 2023-01-12

## 2022-11-10 NOTE — Progress Notes (Signed)
Cardiology Office Note:  .   Date:  11/10/2022  ID:  Juan Tanner, DOB 16-Nov-1965, MRN [id number] PCP: Pcp, No  [facility] Providers Cardiologist:  None Cardiology APP:  Beatrice Lecher, PA-C    Patient Profile: .      PMH: PVCs Rheumatoid arthritis Hypertension GERD Mixed hyperlipidemia   Works as a Naval [employer]. Seen in the 2019 and again in 2022 by Dr. Johney Frame for shortness of breath.  ER visit May 2022 with chest pain. CTA 11/18/2020 was unrevealing. Echocardiogram demonstrated normal EF and no significant valve disease.  At last office visit 01/29/2021 with Tereso Newcomer, PA he was taking methotrexate for RA.  Hypertension had been treated with amlodipine but he had stopped this 2 months prior. Hx of palpitations felt to be 2/2 PVCs. He had clubbing of fingers on exam. Encouraged to follow-up with rheumatology.        History of Present Illness: .   Juan Tanner is a pleasant 57 y.o. male who is here today for cardiac evaluation. 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 He has a copy of EKG that showed PVC which concerned him and he requested an appointment. No family history of heart disease. His mother is still living, his father is deceased. He does not know cause of death. He works out on occasion. Last time at gym was two weeks ago. Reports he feels that something is "off" difficult to explain, just can tell that he is not like he used to be. Reports some numbness in his extremities and under his left arm and shortness of breath with moderate exertion. No chest pain. Admits to recent weight gain and wants to be more active, but is nervous about how he feels when he is working out. He avoids fast food, eats mostly fruit when he is traveling. Eats mostly meals prepared by his wife, sometimes she uses too much salt. He denies edema, orthopnea, PND, presyncope, syncope.  It is not clear if he feels palpitations with PVCs.  ROS: See HPI       Studies  Reviewed: Marland Kitchen   EKG Interpretation Date/Time:  Tuesday November 10 2022 14:46:21 EDT Ventricular Rate:  66 PR Interval:  178 QRS Duration:  98 QT Interval:  388 QTC Calculation: 406 R Axis:   42  Text Interpretation: Normal sinus rhythm with sinus arrhythmia Normal ECG When compared with ECG of 26-Sep-2021 22:06, PREVIOUS ECG IS PRESENT No ST Abnormality Confirmed by Eligha Bridegroom [id number]) on 11/10/2022 2:53:42 PM     Risk Assessment/Calculations:             Physical Exam:   VS:  BP 124/80   Pulse 66   Ht 5\' 5"  (1.651 m)   Wt 195 lb 12.8 oz (88.8 kg)   SpO2 97%   BMI 32.58 kg/m    Wt Readings from Last 3 Encounters:  11/10/22 195 lb 12.8 oz (88.8 kg)  09/26/21 180 lb (81.6 kg)  01/29/21 180 lb 3.2 oz (81.7 kg)    GEN: Well nourished, well developed in no acute distress NECK: No JVD; No carotid bruits CARDIAC: RRR, no murmurs, rubs, gallops RESPIRATORY:  Clear to auscultation without rales, wheezing or rhonchi  ABDOMEN: Soft, non-tender, non-distended EXTREMITIES:  No edema; No deformity     ASSESSMENT AND PLAN: .    DOE: Recent shortness of breath with moderate exertion. No chest pain. Admits to recent weight gain. Is concerned to resume exercise due to  symptoms with exertion. 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 History of dyslipidemia for years, not on lipid lowering medication. We will get coronary CTA for mapping of coronary anatomy. Will have him take Lopressor 100 mg 2 hours prior to test. We are starting rosuvastatin as noted below.   PVCs: History of frequent PVCs, previously seen by Dr. Johney Frame, EP cardiologist. 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 No PVCs on EKG today. It is difficult to discern if he is symptomatic.   Elevated BP: BP has been elevated at home and at visits with other providers per his report. BP is well controlled at this visit. Will consider further monitoring and anti-hypertensive therapy if evidence of consistently  elevated BP.   Rheumatoid arthritis: Feels that symptoms are fairly well controlled. Currently on methotrexate. Management per rheumatology.   Mixed dyslipidemia: LDL 129, triglycerides 325. We discussed increased risk of ASCVD with elevated cholesterol. He is agreeable to start rosuvastatin 20 mg daily.  We will get coronary CTA as noted above for further risk stratification.       Dispo: 2 months with me  Signed, Eligha Bridegroom, NP-C

## 2022-11-10 NOTE — Patient Instructions (Addendum)
Medication Instructions:   START Rosuvastatin one (1) tablet by mouth ( 20 mg) daily.   *If you need a refill on your cardiac medications before your next appointment, please call your pharmacy*   Lab Work:  None ordered.  If you have labs (blood work) drawn today and your tests are completely normal, you will receive your results only by: MyChart Message (if you have MyChart) OR A paper copy in the mail If you have any lab test that is abnormal or we need to change your treatment, we will call you to review the results.   Testing/Procedures:    Your cardiac CT will be scheduled at one of the below locations:   [facility] [address] [place], [place] [address] [phone]  If scheduled at [facility], please arrive at the [facility] and Children's Entrance (Entrance C2) of [facility] 30 minutes prior to test start time. You can use the FREE valet parking offered at entrance C (encouraged to control the heart rate for the test)  Proceed to the [facility] Radiology Department (first floor) to check-in and test prep.  All radiology patients and guests should use entrance C2 at [facility], accessed from [facility], even though the hospital's physical address listed is [address]    There is spacious parking and easy access to the radiology department from the [facility] Heart and Vascular entrance. Please enter here and check-in with the desk attendant.   Please follow these instructions carefully (unless otherwise directed):  An IV will be required for this test and Nitroglycerin will be given.  Hold all erectile dysfunction medications at least 3 days (72 hrs) prior to test. (Ie viagra, cialis, sildenafil, tadalafil, etc)   On the Night Before the Test: Be sure to Drink plenty of water. Do not consume any caffeinated/decaffeinated beverages or chocolate 12 hours prior to your test. Do not take any antihistamines  12 hours prior to your test.  On the Day of the Test: Drink plenty of water until 1 hour prior to the test. Do not eat any food 1 hour prior to test. You may take your regular medications prior to the test.  Two hours prior to test take one (1) tablet by mouth ( 100 mg) of Lopressor.       After the Test: Drink plenty of water. After receiving IV contrast, you may experience a mild flushed feeling. This is normal. On occasion, you may experience a mild rash up to 24 hours after the test. This is not dangerous. If this occurs, you can take Benadryl 25 mg and increase your fluid intake. If you experience trouble breathing, this can be serious. If it is severe call 911 IMMEDIATELY. If it is mild, please call our office.  We will call to schedule your test 2-4 weeks out understanding that some insurance companies will need an authorization prior to the service being performed.   For more information and frequently asked questions, please visit our website : [email or web address]  For non-scheduling related questions, please contact the cardiac imaging nurse navigator should you have any questions/concerns: Cardiac Imaging Nurse Navigators Direct Office Dial: [phone]   For scheduling needs, including cancellations and rescheduling, please call [place], [phone].    Follow-Up: At [facility], you and your health needs are our priority.  As part of our continuing mission to provide you with exceptional heart care, we have created designated Provider Care Teams.  These Care  Teams include your primary Cardiologist (physician) and Advanced Practice Providers (APPs -  Physician Assistants and Nurse Practitioners) who all work together to provide you with the care you need, when you need it.  We recommend signing up for the patient portal called "MyChart".  Sign up information is provided on this After Visit Summary.  MyChart is used to connect with patients for  Virtual Visits (Telemedicine).  Patients are able to view lab/test results, encounter notes, upcoming appointments, etc.  Non-urgent messages can be sent to your provider as well.   To learn more about what you can do with MyChart, go to [email or web address].    Your next appointment:   2 month(s)  Provider:   Eligha Bridegroom, NP

## 2022-11-11 ENCOUNTER — Other Ambulatory Visit: Payer: Self-pay | Admitting: Nurse Practitioner

## 2022-11-12 ENCOUNTER — Telehealth: Payer: Self-pay | Admitting: Nurse Practitioner

## 2022-11-12 ENCOUNTER — Encounter (HOSPITAL_COMMUNITY): Payer: Self-pay

## 2022-11-12 NOTE — Telephone Encounter (Signed)
Pt aware Juan Tanner nor [name] are in the office today. Aware [name] will follow up with him tomorrow about this. Pt appreciates the information

## 2022-11-12 NOTE — Telephone Encounter (Signed)
  The patient mentioned that he is out of network to have his CT scan done at our office. However, his insurance will cover it if it is done at [facility]. He kindly requests that Marcelino Duster send the order to [facility].

## 2022-11-16 NOTE — Telephone Encounter (Signed)
I am happy to send an order to [facility]. Would you ask check-out what is the best method to get this test ordered in [facility] system.

## 2022-11-17 ENCOUNTER — Ambulatory Visit (HOSPITAL_COMMUNITY): Payer: BLUE CROSS/BLUE SHIELD

## 2022-11-18 NOTE — Telephone Encounter (Signed)
S/w Amelia Jo at check out.  Place referral in que.  Given to [place] to proceed with instructions.

## 2022-11-20 NOTE — Telephone Encounter (Signed)
This patient is ordered a cardiac CTA by Eligha Bridegroom, NP and [facility] is out of network for him.  He would like to have it completed at [facility]   I called [facility] and s/w Olegario Messier [phone].  She said that we need to get the authorization before they will schedule it.. Once authorized, we will need to fax the auth and the order to [phone].   The auth will need to include the valid dates it is good for and the refererence number - if no referernce number - get the name of who  you spoke w.  Their facility NPI number is [id number].   Once they have the auth and the order they will call the patient and schedule the appointment.   Please reply once prior Berkley Harvey is completed so we can fax the auth and order together.  Thank you so much!   Haywood Pao,   I have emailed you the [employer]. I included the FFR as well, just in case.   Thanks,   Marchelle Folks

## 2022-11-23 NOTE — Telephone Encounter (Signed)
Auth received and order has been updated.  Faxed both to [phone].

## 2022-11-23 NOTE — Addendum Note (Signed)
Addended by: Lendon Ka on: 11/23/2022 10:07 AM   Modules accepted: Orders

## 2022-11-23 NOTE — Progress Notes (Signed)
Edited cCTA order as patient insurance is out of network w [facility].  Order and Berkley Harvey will be sent to [facility] for scheduling.

## 2023-01-11 NOTE — Progress Notes (Unsigned)
Cardiology Office Note:  .   Date:  01/11/2023  ID:  Juan Tanner, DOB 29-Aug-1965, MRN [id number] PCP: Pcp, No  [facility] Providers Cardiologist:  None Cardiology APP:  Beatrice Lecher, PA-C    Patient Profile: .      PMH: PVCs Rheumatoid arthritis Hypertension GERD Mixed hyperlipidemia   Works as a [employer]. Seen in the 2019 and again in 2022 by Dr. Johney Frame for shortness of breath.  ER visit May 2022 with chest pain. CTA 11/18/2020 was unrevealing. Echocardiogram demonstrated normal EF and no significant valve disease.  At office visit 01/29/2021 with Tereso Newcomer, PA he was taking methotrexate for RA.  Hypertension had been treated with amlodipine but he had stopped this 2 months prior. Hx of palpitations felt to be 2/2 PVCs. He had clubbing of fingers on exam. Encouraged to follow-up with rheumatology.   Seen by me on 11/10/22 cardiac evaluation. 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 Has a copy of EKG that showed PVC which concerned him and he requested an appointment. No family history of heart disease. His mother is still living, his father is deceased. He does not know cause of death. He works out on occasion. Last time at gym was two weeks ago. Reports he feels that something is "off" difficult to explain, just can tell that he is not like he used to be. Reports some numbness in his extremities and under his left arm and shortness of breath with moderate exertion. No chest pain. Admits to recent weight gain and wants to be more active, but is nervous about how he feels when he is working out. Avoids fast food, eats mostly fruit when he is traveling. Eats mostly meals prepared by his wife, sometimes she uses too much salt. He denied edema, orthopnea, PND, presyncope, syncope.  It is not clear if he feels palpitations with PVCs. He was advised to start rosuvastatin 20 mg daily for LDL 129, TGs 325. Coronary CTA was ordered for evaluation of ischemia.   He called back a few days later to report CT was out of network at [facility] and requested it to be completed at [facility].  Order was faxed.      History of Present Illness: .   Juan Tanner is a pleasant 57 y.o. male who is here today for  ROS: See HPI       Studies Reviewed: .         Risk Assessment/Calculations:     No BP recorded.  {Refresh Note OR Click here to enter BP  :1}***       Physical Exam:   VS:  There were no vitals taken for this visit.   Wt Readings from Last 3 Encounters:  11/10/22 195 lb 12.8 oz (88.8 kg)  09/26/21 180 lb (81.6 kg)  01/29/21 180 lb 3.2 oz (81.7 kg)    GEN: Well nourished, well developed in no acute distress NECK: No JVD; No carotid bruits CARDIAC: RRR, no murmurs, rubs, gallops RESPIRATORY:  Clear to auscultation without rales, wheezing or rhonchi  ABDOMEN: Soft, non-tender, non-distended EXTREMITIES:  No edema; No deformity     ASSESSMENT AND PLAN: .    DOE: Recent shortness of breath with moderate exertion. No chest pain. Admits to recent weight gain. Is concerned to resume exercise due to symptoms with exertion. 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 History of dyslipidemia for years, not on lipid lowering medication. We  will get coronary CTA for mapping of coronary anatomy. Will have him take Lopressor 100 mg 2 hours prior to test. We are starting rosuvastatin as noted below.   PVCs: History of frequent PVCs, previously seen by Dr. Johney Frame, EP cardiologist. 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 No PVCs on EKG today. It is difficult to discern if he is symptomatic.   Elevated BP: BP has been elevated at home and at visits with other providers per his report. BP is well controlled at this visit. Will consider further monitoring and anti-hypertensive therapy if evidence of consistently elevated BP.   Rheumatoid arthritis: Feels that symptoms are fairly well controlled. Currently on methotrexate. Management per  rheumatology.   Mixed dyslipidemia: LDL 129, triglycerides 325. We discussed increased risk of ASCVD with elevated cholesterol. He is agreeable to start rosuvastatin 20 mg daily.  We will get coronary CTA as noted above for further risk stratification.       Dispo: ***  Signed, Eligha Bridegroom, NP-C

## 2023-01-12 ENCOUNTER — Ambulatory Visit: Payer: BLUE CROSS/BLUE SHIELD | Attending: Nurse Practitioner | Admitting: Nurse Practitioner

## 2023-01-12 ENCOUNTER — Encounter: Payer: Self-pay | Admitting: Nurse Practitioner

## 2023-01-12 VITALS — BP 126/70 | HR 85 | Ht 65.0 in | Wt 198.0 lb

## 2023-01-12 DIAGNOSIS — R030 Elevated blood-pressure reading, without diagnosis of hypertension: Secondary | ICD-10-CM | POA: Diagnosis not present

## 2023-01-12 DIAGNOSIS — M069 Rheumatoid arthritis, unspecified: Secondary | ICD-10-CM

## 2023-01-12 DIAGNOSIS — I493 Ventricular premature depolarization: Secondary | ICD-10-CM | POA: Diagnosis not present

## 2023-01-12 DIAGNOSIS — E782 Mixed hyperlipidemia: Secondary | ICD-10-CM

## 2023-01-12 DIAGNOSIS — R002 Palpitations: Secondary | ICD-10-CM

## 2023-01-12 MED ORDER — METOPROLOL SUCCINATE ER 25 MG PO TB24: 12.5000 mg | ORAL_TABLET | Freq: Every day | ORAL | 3 refills | Status: DC

## 2023-01-12 NOTE — Patient Instructions (Signed)
Medication Instructions:   START Toprol one half (0.5) tablet by mouth ( 12.5 mg) at bedtime.   DISCONTINUE Lopressor.    *If you need a refill on your cardiac medications before your next appointment, please call your pharmacy*   Lab Work:  Your physician recommends that you return for a FASTING lipid profile/cmet on Tuesday, October 15. You can come in on the day of your appointment anytime between 7:30 am -4:30 pm fasting from midnight the night before. Closed from 12:30-1:30 for lunch.   If you have labs (blood work) drawn today and your tests are completely normal, you will receive your results only by: MyChart Message (if you have MyChart) OR A paper copy in the mail If you have any lab test that is abnormal or we need to change your treatment, we will call you to review the results.   Testing/Procedures:  None ordered.   Follow-Up: At [facility], you and your health needs are our priority.  As part of our continuing mission to provide you with exceptional heart care, we have created designated Provider Care Teams.  These Care Teams include your primary Cardiologist (physician) and Advanced Practice Providers (APPs -  Physician Assistants and Nurse Practitioners) who all work together to provide you with the care you need, when you need it.  We recommend signing up for the patient portal called "MyChart".  Sign up information is provided on this After Visit Summary.  MyChart is used to connect with patients for Virtual Visits (Telemedicine).  Patients are able to view lab/test results, encounter notes, upcoming appointments, etc.  Non-urgent messages can be sent to your provider as well.   To learn more about what you can do with MyChart, go to [email or web address].    Your next appointment:   6 month(s)  Provider:   Eligha Bridegroom, NP         Other Instructions  Your physician wants you to follow-up in: 6 months.  You will receive a reminder letter in the  mail two months in advance. If you don't receive a letter, please call our office to schedule the follow-up appointment.

## 2023-01-19 ENCOUNTER — Ambulatory Visit: Payer: BLUE CROSS/BLUE SHIELD | Attending: Nurse Practitioner

## 2023-01-19 DIAGNOSIS — I493 Ventricular premature depolarization: Secondary | ICD-10-CM

## 2023-01-19 DIAGNOSIS — E782 Mixed hyperlipidemia: Secondary | ICD-10-CM

## 2023-01-19 DIAGNOSIS — M069 Rheumatoid arthritis, unspecified: Secondary | ICD-10-CM

## 2023-01-20 LAB — LIPID PANEL
Chol/HDL Ratio: 4.6 {ratio} (ref 0.0–5.0)
Cholesterol, Total: 164 mg/dL (ref 100–199)
HDL: 36 mg/dL — ABNORMAL LOW (ref >39)
LDL Chol Calc (NIH): 82 mg/dL (ref 0–99)
Triglycerides: 279 mg/dL — ABNORMAL HIGH (ref 0–149)
VLDL Cholesterol Cal: 46 mg/dL — ABNORMAL HIGH (ref 5–40)

## 2023-01-20 LAB — COMPREHENSIVE METABOLIC PANEL
ALT: 29 [IU]/L (ref 0–44)
AST: 24 [IU]/L (ref 0–40)
Albumin: 4.2 g/dL (ref 3.8–4.9)
Alkaline Phosphatase: 131 [IU]/L — ABNORMAL HIGH (ref 44–121)
BUN/Creatinine Ratio: 11 (ref 9–20)
BUN: 9 mg/dL (ref 6–24)
Bilirubin Total: 0.7 mg/dL (ref 0.0–1.2)
CO2: 23 mmol/L (ref 20–29)
Calcium: 9 mg/dL (ref 8.7–10.2)
Chloride: 106 mmol/L (ref 96–106)
Creatinine, Ser: 0.82 mg/dL (ref 0.76–1.27)
Globulin, Total: 2.7 g/dL (ref 1.5–4.5)
Glucose: 160 mg/dL — ABNORMAL HIGH (ref 70–99)
Potassium: 4.1 mmol/L (ref 3.5–5.2)
Sodium: 141 mmol/L (ref 134–144)
Total Protein: 6.9 g/dL (ref 6.0–8.5)
eGFR: 103 mL/min/{1.73_m2} (ref >59)

## 2023-01-22 ENCOUNTER — Other Ambulatory Visit: Payer: Self-pay | Admitting: *Deleted

## 2023-01-22 DIAGNOSIS — E782 Mixed hyperlipidemia: Secondary | ICD-10-CM

## 2023-03-22 ENCOUNTER — Ambulatory Visit: Payer: BLUE CROSS/BLUE SHIELD

## 2023-03-22 DIAGNOSIS — E782 Mixed hyperlipidemia: Secondary | ICD-10-CM

## 2023-03-23 LAB — LIPID PANEL
Chol/HDL Ratio: 3.7 {ratio} (ref 0.0–5.0)
Cholesterol, Total: 158 mg/dL (ref 100–199)
HDL: 43 mg/dL (ref >39)
LDL Chol Calc (NIH): 89 mg/dL (ref 0–99)
Triglycerides: 150 mg/dL — ABNORMAL HIGH (ref 0–149)
VLDL Cholesterol Cal: 26 mg/dL (ref 5–40)

## 2023-03-24 ENCOUNTER — Other Ambulatory Visit (HOSPITAL_BASED_OUTPATIENT_CLINIC_OR_DEPARTMENT_OTHER): Payer: Self-pay | Admitting: *Deleted

## 2023-03-24 DIAGNOSIS — E782 Mixed hyperlipidemia: Secondary | ICD-10-CM

## 2023-03-24 MED ORDER — EZETIMIBE 10 MG PO TABS: 10.0000 mg | ORAL_TABLET | Freq: Every day | ORAL | 3 refills | Status: AC

## 2023-11-06 ENCOUNTER — Other Ambulatory Visit: Payer: Self-pay | Admitting: Nurse Practitioner

## 2023-12-06 ENCOUNTER — Emergency Department (HOSPITAL_COMMUNITY)
Admission: EM | Admit: 2023-12-06 | Discharge: 2023-12-06 | Disposition: A | Discharge status: Home / Self Care | Attending: Emergency Medicine | Admitting: Emergency Medicine

## 2023-12-06 ENCOUNTER — Other Ambulatory Visit: Payer: Self-pay

## 2023-12-06 ENCOUNTER — Encounter (HOSPITAL_COMMUNITY): Payer: Self-pay

## 2023-12-06 DIAGNOSIS — L03213 Periorbital cellulitis: Secondary | ICD-10-CM | POA: Insufficient documentation

## 2023-12-06 DIAGNOSIS — H5711 Ocular pain, right eye: Secondary | ICD-10-CM

## 2023-12-06 MED ORDER — ERYTHROMYCIN 5 MG/GM OP OINT: TOPICAL_OINTMENT | OPHTHALMIC | 0 refills | Status: AC

## 2023-12-06 MED ORDER — CLINDAMYCIN HCL 300 MG PO CAPS: 300.0000 mg | ORAL_CAPSULE | Freq: Three times a day (TID) | ORAL | 0 refills | Status: AC

## 2023-12-06 NOTE — Discharge Instructions (Signed)
 Please take antibiotics as prescribed. Follow with the eye doctor if symptoms worsen.

## 2023-12-06 NOTE — ED Triage Notes (Signed)
 Pt presents to ED from home C/O R eye pain. Reports sore throat, fever since 5 days ago that has gradually gotten better, but now R eye is painful. Denies injury.

## 2023-12-06 NOTE — ED Provider Notes (Signed)
 Emergency Department Provider Note   I have reviewed the triage vital signs and the nursing notes.   HISTORY  Chief Complaint Eye Pain   HPI Juan Tanner is a 58 y.o. male presents to the emergency department with right eye pain and lid swelling.  He has some associated sore throat and fever which are improving.  He has some mild congestion.  He woke up this morning with a painful right eye.  He specifically means that the lid above the right eye is uncomfortable and slightly swollen.  No eye discharge.  He does not wear contact lenses.  No vision changes.  He does not feel like the eye itself is particularly painful. No known injury.    Past Medical History:  Diagnosis Date   Blood in stool    GERD (gastroesophageal reflux disease)    Premature ventricular contraction    outflow tract pvcs    Review of Systems  Constitutional: No fever/chills Eyes: No visual changes. Right eyelid swelling.  Skin: Negative for rash. Neurological: Negative for headaches.  ____________________________________________   PHYSICAL EXAM:  VITAL SIGNS: ED Triage Vitals  Encounter Vitals Group     BP 12/06/23 1043 (!) 153/101     Pulse Rate 12/06/23 1043 88     Resp 12/06/23 1043 16     Temp 12/06/23 1043 98.3 F (36.8 C)     Temp Source 12/06/23 1043 Oral     SpO2 12/06/23 1043 100 %     Weight 12/06/23 1046 190 lb (86.2 kg)     Height 12/06/23 1046 5' 5 (1.651 m)   Constitutional: Alert and oriented. Well appearing and in no acute distress. Eyes: Conjunctivae are normal. PERRL. EOMI. Mild edema to the right upper lid. No visible sty or FB.  Head: Atraumatic. Nose: No congestion/rhinnorhea. Mouth/Throat: Mucous membranes are moist. Cardiovascular: Good peripheral circulation.  Respiratory: Normal respiratory effort.  Gastrointestinal: No distention.  Musculoskeletal: No gross deformities of extremities. Neurologic:  Normal speech and language.  Skin:  Skin is warm, dry and  intact. No rash noted ____________________________________________   PROCEDURES  Procedure(s) performed:   Procedures  None  ____________________________________________   INITIAL IMPRESSION / ASSESSMENT AND PLAN / ED COURSE  Pertinent labs & imaging results that were available during my care of the patient were reviewed by me and considered in my medical decision making (see chart for details).   This patient is Presenting for Evaluation of right eye pain, which does require a range of treatment options, and is a complaint that involves a moderate risk of morbidity and mortality.  The Differential Diagnoses include periorbital cellulitis, conjunctivitis, corneal abrasion, etc.  Medical Decision Making: Summary:  Patient presents to the emergency department with minor eye pain with some upper lid swelling.  No vision change. No injury.  Plan for clinda and erythromycin  ointment.   Patient's presentation is most consistent with acute, uncomplicated illness.   Disposition: discharge  ____________________________________________  FINAL CLINICAL IMPRESSION(S) / ED DIAGNOSES  Final diagnoses:  Pain of right eye  Periorbital cellulitis of right eye     NEW OUTPATIENT MEDICATIONS STARTED DURING THIS VISIT:  New Prescriptions   CLINDAMYCIN  (CLEOCIN ) 300 MG CAPSULE    Take 1 capsule (300 mg total) by mouth 3 (three) times daily for 7 days.   ERYTHROMYCIN  OPHTHALMIC OINTMENT    Place a 1/2 inch ribbon of ointment into the right lower eyelid 4 times a day.    Note:  This document was prepared using  [employer] and may include unintentional dictation errors.  Fonda Law, MD, [facility]    Sameer Teeple, Fonda MATSU, MD 12/06/23 [phone]

## 2024-04-11 ENCOUNTER — Other Ambulatory Visit: Payer: Self-pay | Admitting: Nurse Practitioner

## 2024-04-12 ENCOUNTER — Other Ambulatory Visit: Payer: Self-pay | Admitting: Nurse Practitioner
